# Patient Record
Sex: Female | Born: 1965
Health system: Southern US, Community
[De-identification: ages and names within clinical notes are randomized; demographics above are authoritative.]

## PROBLEM LIST (undated history)

## (undated) DIAGNOSIS — Z9289 Personal history of other medical treatment: Secondary | ICD-10-CM

## (undated) DIAGNOSIS — I10 Essential (primary) hypertension: Secondary | ICD-10-CM

## (undated) DIAGNOSIS — IMO0001 Reserved for inherently not codable concepts without codable children: Secondary | ICD-10-CM

## (undated) DIAGNOSIS — Z5189 Encounter for other specified aftercare: Secondary | ICD-10-CM

## (undated) DIAGNOSIS — J302 Other seasonal allergic rhinitis: Secondary | ICD-10-CM

## (undated) DIAGNOSIS — K219 Gastro-esophageal reflux disease without esophagitis: Secondary | ICD-10-CM

## (undated) DIAGNOSIS — M797 Fibromyalgia: Secondary | ICD-10-CM

## (undated) DIAGNOSIS — R161 Splenomegaly, not elsewhere classified: Principal | ICD-10-CM

## (undated) DIAGNOSIS — G709 Myoneural disorder, unspecified: Secondary | ICD-10-CM

## (undated) DIAGNOSIS — D649 Anemia, unspecified: Secondary | ICD-10-CM

## (undated) DIAGNOSIS — J329 Chronic sinusitis, unspecified: Secondary | ICD-10-CM

## (undated) DIAGNOSIS — E785 Hyperlipidemia, unspecified: Secondary | ICD-10-CM

## (undated) HISTORY — DX: Gastro-esophageal reflux disease without esophagitis: K21.9

## (undated) HISTORY — DX: Myoneural disorder, unspecified: G70.9

## (undated) HISTORY — DX: Reserved for inherently not codable concepts without codable children: IMO0001

## (undated) HISTORY — DX: Encounter for other specified aftercare: Z51.89

## (undated) HISTORY — PX: ABDOMINAL SURGERY: SHX537

## (undated) HISTORY — DX: Splenomegaly, not elsewhere classified: R16.1

## (undated) HISTORY — DX: Essential (primary) hypertension: I10

## (undated) HISTORY — PX: COLONOSCOPY: SHX174

## (undated) HISTORY — DX: Anemia, unspecified: D64.9

## (undated) HISTORY — DX: Hyperlipidemia, unspecified: E78.5

## (undated) HISTORY — PX: OTHER SURGICAL HISTORY: SHX169

---

## 1994-09-17 HISTORY — PX: TONSILLECTOMY: SUR1361

## 1995-01-16 HISTORY — PX: CHOLECYSTECTOMY: SHX55

## 1995-05-18 HISTORY — PX: APPENDECTOMY: SHX54

## 1998-11-12 ENCOUNTER — Ambulatory Visit (HOSPITAL_COMMUNITY): Admission: RE | Admit: 1998-11-12 | Discharge: 1998-11-12 | Payer: Self-pay | Admitting: Family Medicine

## 1998-11-12 ENCOUNTER — Encounter: Payer: Self-pay | Admitting: Family Medicine

## 1998-11-18 ENCOUNTER — Ambulatory Visit (HOSPITAL_BASED_OUTPATIENT_CLINIC_OR_DEPARTMENT_OTHER): Admission: RE | Admit: 1998-11-18 | Discharge: 1998-11-18 | Payer: Self-pay | Admitting: Urology

## 1998-11-18 ENCOUNTER — Encounter: Payer: Self-pay | Admitting: Urology

## 1998-11-27 ENCOUNTER — Encounter: Payer: Self-pay | Admitting: Urology

## 1998-11-27 ENCOUNTER — Emergency Department (HOSPITAL_COMMUNITY): Admission: EM | Admit: 1998-11-27 | Discharge: 1998-11-28 | Payer: Self-pay | Admitting: Emergency Medicine

## 1998-12-02 ENCOUNTER — Encounter: Payer: Self-pay | Admitting: Urology

## 1998-12-02 ENCOUNTER — Ambulatory Visit (HOSPITAL_COMMUNITY): Admission: RE | Admit: 1998-12-02 | Discharge: 1998-12-02 | Payer: Self-pay | Admitting: Urology

## 1999-01-28 ENCOUNTER — Ambulatory Visit (HOSPITAL_COMMUNITY): Admission: RE | Admit: 1999-01-28 | Discharge: 1999-01-28 | Payer: Self-pay | Admitting: Gastroenterology

## 1999-01-28 ENCOUNTER — Encounter: Payer: Self-pay | Admitting: Gastroenterology

## 1999-02-06 ENCOUNTER — Encounter: Admission: RE | Admit: 1999-02-06 | Discharge: 1999-05-07 | Payer: Self-pay | Admitting: *Deleted

## 1999-02-20 ENCOUNTER — Ambulatory Visit (HOSPITAL_COMMUNITY): Admission: RE | Admit: 1999-02-20 | Discharge: 1999-02-20 | Payer: Self-pay | Admitting: Gastroenterology

## 1999-02-20 ENCOUNTER — Encounter: Payer: Self-pay | Admitting: Gastroenterology

## 1999-04-17 ENCOUNTER — Observation Stay (HOSPITAL_COMMUNITY): Admission: RE | Admit: 1999-04-17 | Discharge: 1999-04-18 | Payer: Self-pay | Admitting: General Surgery

## 1999-04-17 ENCOUNTER — Encounter (INDEPENDENT_AMBULATORY_CARE_PROVIDER_SITE_OTHER): Payer: Self-pay | Admitting: Specialist

## 1999-05-19 ENCOUNTER — Encounter: Admission: RE | Admit: 1999-05-19 | Discharge: 1999-05-19 | Payer: Self-pay | Admitting: Internal Medicine

## 1999-06-09 ENCOUNTER — Encounter: Admission: RE | Admit: 1999-06-09 | Discharge: 1999-06-09 | Payer: Self-pay | Admitting: Internal Medicine

## 1999-06-30 ENCOUNTER — Encounter (HOSPITAL_COMMUNITY): Admission: RE | Admit: 1999-06-30 | Discharge: 1999-09-28 | Payer: Self-pay | Admitting: Family Medicine

## 2000-01-23 ENCOUNTER — Encounter: Payer: Self-pay | Admitting: Emergency Medicine

## 2000-01-23 ENCOUNTER — Inpatient Hospital Stay (HOSPITAL_COMMUNITY): Admission: EM | Admit: 2000-01-23 | Discharge: 2000-01-28 | Payer: Self-pay | Admitting: Emergency Medicine

## 2000-02-10 ENCOUNTER — Ambulatory Visit (HOSPITAL_COMMUNITY): Admission: RE | Admit: 2000-02-10 | Discharge: 2000-02-10 | Payer: Self-pay | Admitting: Gastroenterology

## 2000-03-29 ENCOUNTER — Other Ambulatory Visit: Admission: RE | Admit: 2000-03-29 | Discharge: 2000-03-29 | Payer: Self-pay | Admitting: Otolaryngology

## 2000-04-22 ENCOUNTER — Encounter: Admission: RE | Admit: 2000-04-22 | Discharge: 2000-04-22 | Payer: Self-pay | Admitting: *Deleted

## 2000-04-22 ENCOUNTER — Encounter: Payer: Self-pay | Admitting: *Deleted

## 2001-01-07 ENCOUNTER — Other Ambulatory Visit: Admission: RE | Admit: 2001-01-07 | Discharge: 2001-01-07 | Payer: Self-pay | Admitting: Obstetrics and Gynecology

## 2001-02-08 ENCOUNTER — Encounter: Payer: Self-pay | Admitting: Obstetrics and Gynecology

## 2001-02-08 ENCOUNTER — Ambulatory Visit (HOSPITAL_COMMUNITY): Admission: RE | Admit: 2001-02-08 | Discharge: 2001-02-08 | Payer: Self-pay | Admitting: Obstetrics and Gynecology

## 2001-03-02 ENCOUNTER — Encounter: Payer: Self-pay | Admitting: Family Medicine

## 2001-03-02 ENCOUNTER — Encounter: Admission: RE | Admit: 2001-03-02 | Discharge: 2001-03-02 | Payer: Self-pay | Admitting: Family Medicine

## 2001-03-23 ENCOUNTER — Encounter (INDEPENDENT_AMBULATORY_CARE_PROVIDER_SITE_OTHER): Payer: Self-pay

## 2001-03-23 ENCOUNTER — Ambulatory Visit (HOSPITAL_COMMUNITY): Admission: RE | Admit: 2001-03-23 | Discharge: 2001-03-23 | Payer: Self-pay | Admitting: Obstetrics and Gynecology

## 2002-07-28 ENCOUNTER — Other Ambulatory Visit: Admission: RE | Admit: 2002-07-28 | Discharge: 2002-07-28 | Payer: Self-pay | Admitting: Obstetrics and Gynecology

## 2002-08-21 ENCOUNTER — Ambulatory Visit (HOSPITAL_COMMUNITY): Admission: RE | Admit: 2002-08-21 | Discharge: 2002-08-21 | Payer: Self-pay | Admitting: Internal Medicine

## 2003-03-08 ENCOUNTER — Encounter: Payer: Self-pay | Admitting: Internal Medicine

## 2003-03-08 ENCOUNTER — Encounter: Admission: RE | Admit: 2003-03-08 | Discharge: 2003-03-08 | Payer: Self-pay | Admitting: Internal Medicine

## 2003-05-25 ENCOUNTER — Encounter: Payer: Self-pay | Admitting: Family Medicine

## 2003-05-25 ENCOUNTER — Emergency Department (HOSPITAL_COMMUNITY): Admission: AD | Admit: 2003-05-25 | Discharge: 2003-05-25 | Payer: Self-pay | Admitting: Family Medicine

## 2003-11-21 ENCOUNTER — Encounter: Admission: RE | Admit: 2003-11-21 | Discharge: 2004-02-19 | Payer: Self-pay | Admitting: Internal Medicine

## 2004-10-13 ENCOUNTER — Emergency Department (HOSPITAL_COMMUNITY): Admission: EM | Admit: 2004-10-13 | Discharge: 2004-10-13 | Payer: Self-pay | Admitting: Family Medicine

## 2005-04-16 ENCOUNTER — Ambulatory Visit (HOSPITAL_COMMUNITY): Admission: RE | Admit: 2005-04-16 | Discharge: 2005-04-16 | Payer: Self-pay | Admitting: Obstetrics and Gynecology

## 2005-07-01 ENCOUNTER — Encounter: Admission: RE | Admit: 2005-07-01 | Discharge: 2005-08-16 | Payer: Self-pay | Admitting: Internal Medicine

## 2005-07-11 ENCOUNTER — Emergency Department (HOSPITAL_COMMUNITY): Admission: EM | Admit: 2005-07-11 | Discharge: 2005-07-11 | Payer: Self-pay | Admitting: Family Medicine

## 2005-07-18 ENCOUNTER — Inpatient Hospital Stay (HOSPITAL_COMMUNITY): Admission: AD | Admit: 2005-07-18 | Discharge: 2005-07-18 | Payer: Self-pay | Admitting: Obstetrics and Gynecology

## 2005-08-12 ENCOUNTER — Ambulatory Visit (HOSPITAL_COMMUNITY): Admission: RE | Admit: 2005-08-12 | Discharge: 2005-08-12 | Payer: Self-pay | Admitting: Obstetrics and Gynecology

## 2005-08-16 ENCOUNTER — Inpatient Hospital Stay (HOSPITAL_COMMUNITY): Admission: AD | Admit: 2005-08-16 | Discharge: 2005-08-16 | Payer: Self-pay | Admitting: Obstetrics and Gynecology

## 2005-09-09 ENCOUNTER — Encounter: Admission: RE | Admit: 2005-09-09 | Discharge: 2005-12-08 | Payer: Self-pay | Admitting: Internal Medicine

## 2006-10-13 ENCOUNTER — Ambulatory Visit (HOSPITAL_COMMUNITY): Admission: RE | Admit: 2006-10-13 | Discharge: 2006-10-13 | Payer: Self-pay | Admitting: Gastroenterology

## 2006-10-18 ENCOUNTER — Ambulatory Visit: Payer: Self-pay | Admitting: Vascular Surgery

## 2006-10-18 ENCOUNTER — Ambulatory Visit (HOSPITAL_COMMUNITY): Admission: RE | Admit: 2006-10-18 | Discharge: 2006-10-18 | Payer: Self-pay | Admitting: Family Medicine

## 2006-12-27 ENCOUNTER — Ambulatory Visit (HOSPITAL_COMMUNITY): Admission: RE | Admit: 2006-12-27 | Discharge: 2006-12-27 | Payer: Self-pay | Admitting: Family Medicine

## 2007-06-08 ENCOUNTER — Emergency Department (HOSPITAL_COMMUNITY): Admission: EM | Admit: 2007-06-08 | Discharge: 2007-06-08 | Payer: Self-pay | Admitting: *Deleted

## 2007-07-20 ENCOUNTER — Emergency Department (HOSPITAL_COMMUNITY): Admission: EM | Admit: 2007-07-20 | Discharge: 2007-07-20 | Payer: Self-pay | Admitting: Emergency Medicine

## 2007-10-11 ENCOUNTER — Ambulatory Visit (HOSPITAL_COMMUNITY): Admission: RE | Admit: 2007-10-11 | Discharge: 2007-10-11 | Payer: Self-pay | Admitting: Obstetrics and Gynecology

## 2008-03-09 ENCOUNTER — Ambulatory Visit (HOSPITAL_COMMUNITY): Admission: RE | Admit: 2008-03-09 | Discharge: 2008-03-09 | Payer: Self-pay | Admitting: Obstetrics and Gynecology

## 2008-03-29 ENCOUNTER — Encounter: Admission: RE | Admit: 2008-03-29 | Discharge: 2008-05-08 | Payer: Self-pay | Admitting: Obstetrics and Gynecology

## 2008-06-23 ENCOUNTER — Inpatient Hospital Stay (HOSPITAL_COMMUNITY): Admission: AD | Admit: 2008-06-23 | Discharge: 2008-06-23 | Payer: Self-pay | Admitting: Obstetrics and Gynecology

## 2008-07-06 ENCOUNTER — Inpatient Hospital Stay (HOSPITAL_COMMUNITY): Admission: AD | Admit: 2008-07-06 | Discharge: 2008-07-06 | Payer: Self-pay | Admitting: Obstetrics and Gynecology

## 2008-07-17 ENCOUNTER — Inpatient Hospital Stay (HOSPITAL_COMMUNITY): Admission: AD | Admit: 2008-07-17 | Discharge: 2008-07-17 | Payer: Self-pay | Admitting: Obstetrics and Gynecology

## 2008-07-17 DIAGNOSIS — Z9289 Personal history of other medical treatment: Secondary | ICD-10-CM

## 2008-07-17 HISTORY — DX: Personal history of other medical treatment: Z92.89

## 2008-07-31 ENCOUNTER — Encounter: Admission: RE | Admit: 2008-07-31 | Discharge: 2008-08-29 | Payer: Self-pay | Admitting: Obstetrics and Gynecology

## 2008-08-06 ENCOUNTER — Ambulatory Visit: Admission: RE | Admit: 2008-08-06 | Discharge: 2008-08-06 | Payer: Self-pay | Admitting: Obstetrics and Gynecology

## 2009-01-05 ENCOUNTER — Emergency Department (HOSPITAL_COMMUNITY): Admission: EM | Admit: 2009-01-05 | Discharge: 2009-01-05 | Payer: Self-pay | Admitting: Family Medicine

## 2009-01-22 ENCOUNTER — Emergency Department (HOSPITAL_COMMUNITY): Admission: EM | Admit: 2009-01-22 | Discharge: 2009-01-22 | Payer: Self-pay | Admitting: Family Medicine

## 2009-03-28 ENCOUNTER — Encounter: Payer: Self-pay | Admitting: Family Medicine

## 2009-03-28 ENCOUNTER — Ambulatory Visit (HOSPITAL_COMMUNITY): Admission: RE | Admit: 2009-03-28 | Discharge: 2009-03-28 | Payer: Self-pay | Admitting: Family Medicine

## 2009-03-28 ENCOUNTER — Ambulatory Visit: Payer: Self-pay | Admitting: Vascular Surgery

## 2009-08-08 ENCOUNTER — Ambulatory Visit (HOSPITAL_COMMUNITY): Admission: RE | Admit: 2009-08-08 | Discharge: 2009-08-08 | Payer: Self-pay | Admitting: Internal Medicine

## 2009-08-22 ENCOUNTER — Emergency Department (HOSPITAL_COMMUNITY): Admission: EM | Admit: 2009-08-22 | Discharge: 2009-08-22 | Payer: Self-pay | Admitting: Emergency Medicine

## 2010-02-18 ENCOUNTER — Ambulatory Visit (HOSPITAL_COMMUNITY): Admission: RE | Admit: 2010-02-18 | Discharge: 2010-02-18 | Payer: Self-pay | Admitting: Obstetrics and Gynecology

## 2010-02-21 ENCOUNTER — Ambulatory Visit (HOSPITAL_COMMUNITY): Admission: RE | Admit: 2010-02-21 | Discharge: 2010-02-21 | Payer: Self-pay | Admitting: Obstetrics and Gynecology

## 2010-04-19 ENCOUNTER — Emergency Department (HOSPITAL_COMMUNITY): Admission: EM | Admit: 2010-04-19 | Discharge: 2010-04-19 | Payer: Self-pay | Admitting: Family Medicine

## 2010-09-03 ENCOUNTER — Ambulatory Visit (HOSPITAL_COMMUNITY)
Admission: RE | Admit: 2010-09-03 | Discharge: 2010-09-03 | Payer: Self-pay | Source: Home / Self Care | Attending: Endocrinology | Admitting: Endocrinology

## 2010-09-07 ENCOUNTER — Encounter: Payer: Self-pay | Admitting: Internal Medicine

## 2010-11-25 LAB — POCT URINALYSIS DIP (DEVICE)
Bilirubin Urine: NEGATIVE
Nitrite: NEGATIVE
Protein, ur: NEGATIVE mg/dL
pH: 6.5 (ref 5.0–8.0)

## 2010-11-25 LAB — POCT PREGNANCY, URINE: Preg Test, Ur: NEGATIVE

## 2011-01-02 NOTE — Op Note (Signed)
[facility]  Patient:    Alexis Campos, Alexis Campos                   MRN: [id number] Proc. Date: 03/23/01 Adm. Date:  [id number] Attending:  Maxie Better                           Operative Report  PREOPERATIVE DIAGNOSES:       1. Pelvic pain.                               2. History of pelvic endometriosis.  POSTOPERATIVE DIAGNOSES:      1. Pelvic pain.                               2. History of pelvic endometriosis.                               3. Pelvic/bowel adhesions.  PROCEDURES:                   1. Diagnostic laparoscopy.                               2. Lysis of adhesions.  ANESTHESIA:                   General.  SURGEON:                      Sheronette A. Cousins, M.D.  DESCRIPTION OF PROCEDURE:     Under adequate general anesthesia, the patient was placed in the dorsal lithotomy position.  She was sterilely prepped and draped in the usual fashion.  The bladder was catheterized for a large amount of urine.  A bivalve speculum was placed in the vagina.  A single-tooth tenaculum was placed on the anterior lip of the cervix.  An acorn cannula was introduced into the cervical os and attached to the tenaculum for manipulation of the uterus.  Attention was then turned to the abdomen, where an infraumbilical incision was made through the previous scar.  A Veress needle was introduced and tested for placement using normal saline.  Opening pressure of 3 was noted.  Then, 2.5 L of CO2 was insufflated.  The Veress needle was removed.  A 10 mm disposable trocar was introduced into the abdomen without incident.  The patient was placed in Trendelenburg.  A small, suprapubic incision was then made and, under direct visualization, a 5 mm port was placed.  Inspection of the pelvis was notable for a normal uterus.  The anterior cul-de-sac was notable for two staples imbedded in the peritoneum on the right side.   The posterior cul-de-sac on the left  was an area of old, burnt-out scarring, probably from old prior endometriosis.  There was some bowel adhesion on the left pelvic side wall.  The left tube and ovary were normal with no evidence of endometriosis seen.  On the right, there were right colon adhesions to the right pelvic side wall and to the right adnexa proximally.  Using the [facility] scissors, the bowel adhesions were carefully lysed, as were the right adnexal adhesions.  The liver edge appeared to be normal.  The  abdomen was then subsequently irrigated.  The peritoneal tissue on the right pelvic side wall was removed and sent for evaluation for any evidence of endometriosis.  A small amount of fluid was left in the pelvis. The procedure was terminated after good hemostasis was noted.  The ______ port was removed under direct visualization.  The abdomen was deflated and the infraumbilical port removed under direct visualization.  The skin incisions were approximated with 4-0 Vicryl after being injected with 0.25% Marcaine. The instruments from the vagina were removed.  SPECIMENS:                    Right pelvic side wall peritoneal biopsy.  ESTIMATED BLOOD LOSS:         Minimal.  COMPLICATIONS:                None.  DISPOSITION:                  The patient tolerated the procedure well and was transferred to the recovery room in stable condition. DD:  03/23/01 TD:  03/24/01 Job: [id number]

## 2011-01-02 NOTE — Op Note (Signed)
NAMEJAMIRACLE, Alexis Campos            ACCOUNT NO.:  [id number]   MEDICAL RECORD NO.:  [id number]          PATIENT TYPE:  AMB   LOCATION:  ENDO                         FACILITY:  [facility]   PHYSICIAN:  Graylin Shiver, M.D.   DATE OF BIRTH:  1966-06-04   DATE OF PROCEDURE:  10/13/2006  DATE OF DISCHARGE:                               OPERATIVE REPORT   PROCEDURE:  Colonoscopy.   INDICATIONS FOR PROCEDURE:  Rectal bleeding, family history of colon  cancer and colon polyps.   Informed consent was obtained after explanation of the risks of  bleeding, infection and perforation.   MEDICATIONS:  See EGD report.   PROCEDURE:  With the patient in the left lateral decubitus position a  rectal exam was performed.  No masses were felt.  There were some  external hemorrhoids.  The colonoscope was inserted into the rectum and  advanced around the colon to the cecum.  Cecal landmarks were  identified.  The cecum, ascending colon looked normal.  The transverse  colon looked normal.  The descending colon, sigmoid and rectum looked  normal.  She tolerated the procedure well without complications.   IMPRESSION:  Normal colonoscopy with some external hemorrhoids.   I would recommend a repeat colonoscopy again in view of her family  history in 5 years.           ______________________________  Graylin Shiver, M.D.     SFG/MEDQ  D:  10/13/2006  T:  10/13/2006  Job:  [id number]   cc:   Dorisann Frames, M.D.

## 2011-01-02 NOTE — H&P (Signed)
[place]. [facility]  Patient:    Alexis Campos, Alexis Campos                     MRN: [id number] Adm. Date:  [id number] Attending:  Ophelia Shoulder Dictator:   Moshe Salisbury, R.N., A.C.N.P.                         History and Physical  DATE OF BIRTH:  April 23, 1966  CHIEF COMPLAINT:  Thoracic back pain, severe, with pain in her right upper chest radiating to the right arm all the way to the right hand and the right side of the neck to the right jaw.  HISTORY OF PRESENT ILLNESS:  The patient had back pain with shortness of breath, nausea, and diaphoresis in March while she was in [place].  She was checked by an M.D. there, who told her that her blood pressure was high.  This pain lasted for about 45 minutes.  Three weeks ago, she had severe pain in her mid-back with shortness of breath, diaphoresis, and nausea.  It lasted one to two hours this time and was severe, about an 8 to 9 on a scale of 1/10 for pain.  She has not felt well for more than a week.  She has been tired with shortness of breath with only a little exertion and also breathless when she talked.  She has had pain with deep breaths under her right shoulder blade until about two days ago.  Last night, she had some discomfort in the middle of her upper back with a cramping feeling in her chest.  She felt sick and sweated and this was off and on until about 2 oclock a.m., when she had severe upper back pain, which radiated to her right arm and neck with pain in her right upper chest.  She also had diaphoresis, shortness of breath, and nausea.  This lasted about two hours.  She took a hot shower and some hydrocodone and then the pain eased off.  It started again at about noon and became bad but not severe, about a 6 or 7 on a scale of 0/10.  She also had nausea and diarrhea and states that the radiated pain to her right arm, right neck, and chest was worse than before.  She still has pain in her  back at about a 2 to 3 on a scale of 0/10 and she states that the pain in her back feels like a vise is squeezing her between her shoulder blades.  She states that the chest pain and neck pain and jaw pain this evening come and go.  She also said that she had only received four 81 mg aspirins but no nitroglycerin to relieve the pain.  PAST MEDICAL HISTORY:  Insulin-dependent diabetes mellitus.  She has had diabetes since 1996.  She also has hypertension, hypercholesterolemia, endometriosis, a history of leukoplakia, and she also has a history of kidney stones.  She states that she has a fatty liver.  PAST SURGICAL HISTORY:  Tonsillectomy, two biopsies on her tongue, a cholecystectomy, an appendectomy, laparoscopy to remove adhesions x 3, and liver biopsy in August.  She had cystoscopy with stents twice.  ALLERGIES:  The patient states that CODEINE causes her nausea and some unknown PAIN MEDICATION caused her hands to swell.  CURRENT MEDICATIONS:  1. Glucophage XR 500 mg 4 every evening with food.  2. Zocor 20 mg 1 every  night at bedtime.  3. Aygestin 5 mg 1 every morning.  4. Avandia 4 mg 1 every morning and 1 every evening with meals.  5. Multivitamin once a day.  6. Enteric-coated aspirin 81 mg 1 every morning.  7. Altace 10 mg 1 every morning.  8. Hydrocodone 50/500 mg 1 every six hours if needed for pain.  9. She does a capillary blood glucose once twice a day. 10. She takes insulin 70/30 31 units subcu in the morning and 15 units subcu     in the evening.  FAMILY HISTORY:  The patients family history is significant for her mother having had coronary artery bypass grafting x 4.  Her mother is 39 years old and has diabetes mellitus, hypertension, anemia, and coronary artery disease. On her mothers side, there is a strong history of coronary artery disease. Her father is estranged from the family but she notes that he is about 54 and has had congestive heart failure and colon  cancer.  She has three brothers, one of whom has irritable bowel syndrome.  Otherwise, they are healthy.  She knows that one of her maternal cousins has both diabetes mellitus and coronary artery bypass grafting.  SOCIAL HISTORY:  The patient is single.  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  She does not use alcohol.  She does not smoke. She rarely uses caffeine.  She does not abuse drugs.  She is left handed.  REVIEW OF SYSTEMS:  Constitutional:  The patient denies fevers.  She has had chills last night and diaphoresis with her chest pain every time she has had chest pain.  She states that she has gained over 20 pounds in the last few months.  She has some swelling in her hands.  She has been sleeping restlessly lately.  Eyes:  The patient denies diplopia, contacts, glaucoma, or cataracts. She does have some blurring due to her diabetes and she wears glasses to drive.  Ears, nose, mouth, and throat:  The patient denies deafness, tinnitus, sneezing, dysacusia, sores in her mouth, or dentures.  She does have some rhinorrhea.  Cardiovascular:  Chest pain as noted.  The patient says that her heart races and pounds sometimes.  She does have dyspnea on exertion and with talking.  She has been sleeping on two pillows lately and states that her bilateral calves hurt when she walks.  Respiratory:  The patient denies coughing, wheezing, or the use of tobacco.  She does produce some sputum when she clears her throat.  Gastrointestinal:  The patient denies dysphagia, indigestion, but she has had nausea recently with her chest pain and she had diarrhea today.  She frequently has constipation.  Genitourinary:  The patient denies dysuria, pyuria, hematuria, anuria, hesitation, and frequency.  She has nocturia x 2-3.  Her last menstrual period was in May 2000.  She takes medication to suppress her  menstrual periods.  Musculoskeletal:  The patient has had aching joints this week.  She has no severe myalgias.  She has been fatigued lately.  Her gait is steady.  Skin:  The patient denies any rashes or  skin problems, though she states her face has been getting red lately. Breasts:  The patient denies masses, lumps, or tenderness of the breasts, though she states that in her right breast she has noticed a thickening of the right nipple and plans to have this checked by her  GYN doctor.  Neurologic: The patient states that her hands have been trembling lately and she has had some faintness but without syncope.  She also has had dizziness with change in her posture.  Her hands and feet get numb with her faintness.  She denies any seizures of the signs and symptoms of a stroke.  Psychiatric:  The patient states that she has been under stress lately and has felt some depression. She denies anorexia or hallucinations.  Endocrine:  The patient denies any thyroid disease but she does have insulin-dependent diabetes mellitus.  She states that she has had some excessive thirst and hunger lately.  Hematologic: The patient states that she both bruises and bleeds easily.  Lymphatics:  The patient denies adenopathy of the neck, axillae, and groin.  Allergic:  The patient states that CODEINE causes nausea and that she has seasonal allergies.  PHYSICAL EXAMINATION:  VITAL SIGNS:  Blood pressure 147/61, pulse 102, respirations 18, temperature 97.5, SAO2 100% on 2 L of oxygen by nasal cannula.  GENERAL:  The patient is a 45 year old obese white female who is well-developed and well-nourished and in no acute distress.  HEENT:  The patients pupils are equal, round and reactive to light and 2 mm in diameter.  She is normocephalic, atraumatic.  Her mouth is moist.  Her oropharynx is benign.  NECK:  Supple.  She has a midline trachea without jugular venous distention, bruit, or thyromegaly.  CHEST:   Clear to auscultation and percussion.  She is eupneic.  She is on 2 L of oxygen by nasal cannula.  BREASTS:  Normal contour.  There are nontender and without discharge.  ADENOPATHY:  The patient is without cervical adenopathy.  CARDIAC:  The patient has a regular rate and rhythm.  S1 and S2 are clearly heard.  No murmurs, gallops, rubs, or clicks are auscultated.  Her apical impulse is not displaced.  ABDOMEN:  The patient has bowel sounds in all quadrants but she is also tender throughout her abdomen.  She states this is normal for her due to endometriosis.  Her abdomen is obese and soft and she has increased percussion over the quadrants of her lower abdomen.  GENITOURINARY:  The patient is nontender over her bladder.  RECTUM:  The rectum is not checked at this time.  It is not pertinent to the exam.  EXTREMITIES:  The patient moves all extremities x 4.  Her strength is 5/5 in her upper and lower extremities.  She is without ankle edema.  SKIN:  The patients skin is warm and dry and without jaundice, cyanosis, pallor, or rashes.  She has brisk capillary refill.  NEUROLOGIC:  The patient is conscious, alert, and oriented to person, place, time, and situation.  Cranial nerves II-XII are grossly intact.  She has fine tremors in her hands.  LABORATORY DATA:  Chest x-ray:  The patients chest x-ray shows no acute disease.  EKG:  The patients EKG shows normal sinus rhythm with a small Q wave in lead III.  No significant changes from April 15, 1999.  The patients sodium is 139, potassium 4.9, chloride 109, bicarb 27, BUN 14, creatinine 0.9, glucose 113, alkaline phosphatase low at 32.  White blood cell count 6.0, hemoglobin 13.6, hematocrit 41, platelets 317.  Her leukocytes are low at 27.  Her monocytes are low at 4.  Her APPT is 24, her PT is 13.6, her INR is 1.1.  Her CK is 64 and her MB is less than 0.3.  The relative index is not calculated.  Her troponin-I is less than  0.03.  IMPRESSION: 1. Chest pain, rule out myocardial infarction. 2. Hypertension. 3. Hypercholesterolemia. 4. Obesity. 5. Shortness of breath. 6. Nausea and diaphoresis. 7. Insulin-dependent diabetes mellitus.  PLAN: 1. Admit to telemetry. 2. Nitroglycerin drip. 3. Serial enzymes. 4. EKG in the morning. 5. Medications to treat diabetes, hypertension, and hypercholesterolemia. DD:  01/23/00 TD:  01/24/00 Job: [id number]

## 2011-01-02 NOTE — Cardiovascular Report (Signed)
[place]. [facility]  Patient:    Alexis Campos, Alexis Campos                     MRN: [id number] Proc. Date: 01/26/00 Adm. Date:  [id number] Attending:  Ophelia Shoulder CC:         [facility]             [facility] Cardiac Cath Lab             Madaline Savage, M.D.             Fritzi Mandes, M.D.                        Cardiac Catheterization  PROCEDURES PERFORMED: 1. Selective coronary angiography by Judkins technique. 2. Retrograde left heart catheterization. 3. Left ventriculography.  COMPLICATIONS:  None.  ENTRY SITE:  Right femoral.  DYE USED:  Omnipaque.  PATIENT PROFILE:  The patient is a 45 year old diabetic woman who has had right-sided chest pain for several months which comes on with exertion and is relieved by rest.  She has three other family members who have had this same presentation before, later determining that they had coronary artery disease. The patient was admitted on January 23, 2000 to [facility] and subsequent cardiac enzymes and EKGs have been negative for myocardial infarction.  Today, she enters the cardiac catheterization lab at [facility] on an inpatient basis in transfer from [facility]; this was an elective procedure.  RESULTS:  Pressures:  The left ventricular pressure was 120/18.  Central aortic pressure 120/75; mean of 95.  Angiographic results:  The left main coronary artery was normal.  The left anterior descending coronary artery ends on the lower anterior wall. It gives rise to one bifurcating diagonal branch.  The LAD and the diagonal are basically unremarkable in appearance.  It should be noted that the distal vessel in a fairly abrupt pattern tapers quickly, but no lesions are seen.  The left circumflex coronary artery gives rise to one very large obtuse marginal branch which is normal in appearance.  The right coronary artery is dominant and angiographically patent.  The left  ventricle shows normal contractility with no mitral regurgitation or LV thrombus.  FINAL DIAGNOSES: 1. Angiographically patent coronary arteries. 2. Normal left ventricular systolic function. DD:  01/26/00 TD:  01/28/00 Job: [id number]

## 2011-01-02 NOTE — Procedures (Signed)
[place]. [facility]  Patient:    Alexis Campos, Alexis Campos                     MRN: [id number] Proc. Date: 02/16/00 Adm. Date:  [id number] Disc. Date: [id number] Attending:  Judeth Cornfield CC:         Fritzi Mandes, M.D.             Madaline Savage, M.D.             Achilles Dunk, M.D., [facility]                           Procedure Report  PROCEDURE:  Esophageal manometry.  HISTORY:  [name] has been suffering from episodic episodes of severe chest pain consisting of pain emanating between the shoulder blades and radiating to her anterior chest.  Recent cardiac catheterization was negative, as was an upper endoscopy.  She has undergone abdominal ultrasound, testing of LFTs and amylase, all of which were negative.  MRCP and ERCP in the past have been negative.  Test was performed with provocative testing with Tessalon, to rule out esophageal spasm.  DESCRIPTION OF PROCEDURE:  The manometry was performed in the usual pull-through fashion.  Tensilon was then administered per protocol.  FINDINGS: 1. Upper esophageal sphincter pressure and contractions were normal. 2. There were normal peristaltic contractions throughout the body of the    esophagus.  In a single channel there was slight elevation of the amplitude    of the contractions measuring 189 and 246 mmHg, respectively.  After    injection of Tensilon, contractions increased from 109 to 196 mmHg in the    proximal lead, 189 to 236 mmHg in the middle lead and 246 to 268 mmHg in    the distal channel.  Contractions remained peristaltic after Tensilon    administration. 3. Resting pressure was slightly elevated at 51.9 mmHg.  Relaxation was 93%.  The patient did not experience chest pain after administration of Tensilon.  IMPRESSION:  Probable normal esophageal manometry except for a slight elevation of the amplitude of esophageal contractions. DD:  02/16/00 TD:  02/17/00 Job:  [id number]

## 2011-01-02 NOTE — Procedures (Signed)
[facility]  Patient:    TAKEYSHA, BONK                     MRN: [id number] Proc. Date: 01/28/00 Adm. Date:  [id number] Disc. Date: [id number] Attending:  Ophelia Shoulder CC:         Madaline Savage, M.D.             Coralee North, M.D.                           Procedure Report  PROCEDURE:  Upper endoscopy.  HISTORY OF PRESENT ILLNESS:  Ms. Stefan Church is a 45 year old diabetic female admitted with severe mid back pain. Cardiac cath was negative. She has a history of recurrent right upper quadrant pain with a negative extensive workup. Test was performed to rule out esophageal abnormalities.  INFORMED CONSENT:  The patient provided consent after risks, benefits, and alternatives were explained.  MEDICATIONS:  Versed 12, fentanyl 100 mcg IV and cetacaine spray.  DESCRIPTION OF PROCEDURE:  The patient was placed in the left lateral decubitus position and administered continuous low flow oxygen and was placed on pulse oximetry. The Olympus video gastroscope was inserted under direct vision into the oropharynx and esophagus.  FINDINGS:  Normal esophagus, stomach and duodenum.  IMPRESSION: 1. Normal upper endoscopy. 2. Abdominal pain of unclear etiology.  RECOMMENDATION:  Proceed with esophageal manometry with provocative testing (Tensilon test). DD:  01/28/00 TD:  01/30/00 Job: [id number]

## 2011-01-02 NOTE — Discharge Summary (Signed)
[place]. [facility]  Patient:    Alexis Campos, Alexis Campos                     MRN: [id number] Adm. Date:  [id number] Disc. Date: 01/28/00 Attending:  Judeth Cornfield Dictator:   Halford Decamp Delanna Ahmadi, R.N., N.P. CC:         Fritzi Mandes, M.D.             Alexis Hair. Arlyce Campos, M.D. [facility]             Adolph Pollack, M.D.                           Discharge Summary  Alexis Campos was admitted with complaints of chest pain, shortness of breath, nausea and diaphoresis occurring first in March in [place].  The night before admission she had discomfort in the middle of her upper back with cramping feeling in her chest.  She had nausea and diaphoresis on and off. She then had severe back pain that radiated to her right arm and back and pain in her right chest lasting for two hours.  She came to the emergency room. She was admitted to rule out an MI.  She was put on Lovenox, IV nitroglycerin, aspirin and beta-blocker.  She ruled out for an MI.  She underwent cardiac catheterization on January 26, 2000, which showed normal coronaries with normal LV function.  A GI consult was called.  She was seen by Alexis Hair. Arlyce Campos, M.D. An EGD was normal.  She was considered stable on January 28, 2000, to be discharged to home with further outpatient work-up for GI etiology of her chest pain.  LABS:  On January 23, 2000, hemoglobin was 13.6, hematocrit 41.0, wbc 6, platelets 317, sodium 139, potassium 4.8, BUN 14, creatinine 0.9, amylase 64, lipase 40. AST 22, ALT 17, ALP was 32.  CK-MB #1 was 64/0.3 with troponin less than 0.03, #2 was 51/0.3 with troponin less than 0.03, #3 was 51/.3.  Total cholesterol was 164, triglycerides was 106, HDL was 24, LDL was 119.  Total cholesterol HDL ratio was 6.8.  Lipase was 40.  EKG showed sinus tachycardia, otherwise normal.  No arrhythmias while on telemetry.  Chest x-ray showed nonactive disease.  EGD was normal.  DISCHARGE MEDICATIONS:  1. Toprol  XL 50 mg once a day.  2. Protonix 40 mg every day.  3. Percocet one tablet every six hours as needed for pain.  4. Glucophage XR 500 mg four every p.m. with food.  5. Zocor 20 mg _____________ sleep h.s. then 5 mg one every a.m.  6. Avandia 4 mg every a.m. and one every p.m.  7. Multivitamin everyday.  8. Aspirin 81 mg every a.m.  9. Altace 10 mg every a.m. 10. Insulin 70/30 subcutaneous 31 units in the morning and 15 units in the     p.m.  FOLLOW-UP:  She will follow up with Alexis Campos. Alexis Campos, F.N.P.C., at 10:10 on March 02, 2000.  She will have an appointment with Alexis Hair. Arlyce Campos, M.D., on February 16, 2000, at 3:45.  She is scheduled to have an esophageal manometry February 10, 2000, at 10:30.  DIET:  She is to continue her diabetic low fat diet.  ACTIVITY:  She should do no strenuous activity or driving for two days.  WOUND:  If she has any problems with her groin, she will give Alexis Campos, M.D.s,  office a call.  DISCHARGE DIAGNOSES: 1. Chest pain, etiology unknown.  Cardiac ischemia ruled out with cardiac    catheterization showing normal coronaries and normal left ventricular    function.  CK-MBs negative and normal EKG. 2. Adult onset diabetes mellitus type 2 on insulin. 3. Hypertension. 4. History of hypercholesterolemia. 5. History of endometriosis. 6. History of nephrolithiasis. 7. Multiple surgeries including tonsillectomy, two biopsies of the tongue,    cholecystectomy, appendectomy, laparoscopic retrieval of adhesions x 3,    liver biopsy and cystoscopy with stents x 2. DD:  02/11/00 TD:  02/11/00 Job: [id number]

## 2011-01-02 NOTE — Op Note (Signed)
NAMEMARGARINE, Alexis Campos            ACCOUNT NO.:  [id number]   MEDICAL RECORD NO.:  [id number]          PATIENT TYPE:  AMB   LOCATION:  ENDO                         FACILITY:  [facility]   PHYSICIAN:  Graylin Shiver, M.D.   DATE OF BIRTH:  1966-01-22   DATE OF PROCEDURE:  10/13/2006  DATE OF DISCHARGE:                               OPERATIVE REPORT   INDICATIONS:  Heartburn.   Informed consent was obtained after explanation of the risks of  bleeding, infection and perforation.   PREMEDICATION:  The patient was given a total of fentanyl 200 mcg IV and  Versed 17.5 mg IV for both EGD and colonoscopy which followed the EGD.   PROCEDURE:  With the patient in the left lateral decubitus position.  The Pentax gastroscope was inserted into the oropharynx and passed into  the esophagus.  It was advanced down the esophagus, then into the  stomach and into the duodenum.  The second portion and bulb of the  duodenum were normal.  The stomach showed some erythema in the antrum.  No ulcers or erosions.  The body of the stomach looked normal.  The  fundus and cardia looked normal.  The esophagus looked normal.  The  esophagogastric junction looked normal at 40 cm.  A biopsy for CLO-test  was obtained from the distal stomach.  She tolerated the procedure well  without complications.   IMPRESSION:  Minimal gastritis.   PLAN:  The CLO test will be checked.           ______________________________  Graylin Shiver, M.D.     SFG/MEDQ  D:  10/13/2006  T:  10/13/2006  Job:  [id number]   cc:   Dorisann Frames, M.D.

## 2011-01-25 ENCOUNTER — Other Ambulatory Visit: Payer: Self-pay | Admitting: Rheumatology

## 2011-01-25 ENCOUNTER — Ambulatory Visit (HOSPITAL_COMMUNITY)
Admission: RE | Admit: 2011-01-25 | Discharge: 2011-01-25 | Disposition: A | Discharge status: Home / Self Care | Payer: 59 | Source: Ambulatory Visit | Attending: Rheumatology | Admitting: Rheumatology

## 2011-01-25 DIAGNOSIS — M25561 Pain in right knee: Secondary | ICD-10-CM

## 2011-01-25 DIAGNOSIS — M19039 Primary osteoarthritis, unspecified wrist: Secondary | ICD-10-CM | POA: Insufficient documentation

## 2011-01-25 DIAGNOSIS — M25569 Pain in unspecified knee: Secondary | ICD-10-CM | POA: Insufficient documentation

## 2011-04-22 ENCOUNTER — Other Ambulatory Visit (HOSPITAL_COMMUNITY): Payer: Self-pay | Admitting: Gastroenterology

## 2011-04-22 DIAGNOSIS — R109 Unspecified abdominal pain: Secondary | ICD-10-CM

## 2011-04-27 ENCOUNTER — Other Ambulatory Visit (HOSPITAL_COMMUNITY): Payer: 59

## 2011-04-27 ENCOUNTER — Ambulatory Visit (HOSPITAL_COMMUNITY)
Admission: RE | Admit: 2011-04-27 | Discharge: 2011-04-27 | Disposition: A | Discharge status: Home / Self Care | Payer: 59 | Source: Ambulatory Visit | Attending: Gastroenterology | Admitting: Gastroenterology

## 2011-04-27 DIAGNOSIS — R109 Unspecified abdominal pain: Secondary | ICD-10-CM | POA: Insufficient documentation

## 2011-04-27 DIAGNOSIS — N200 Calculus of kidney: Secondary | ICD-10-CM | POA: Insufficient documentation

## 2011-04-27 DIAGNOSIS — K7689 Other specified diseases of liver: Secondary | ICD-10-CM | POA: Insufficient documentation

## 2011-04-27 DIAGNOSIS — R161 Splenomegaly, not elsewhere classified: Secondary | ICD-10-CM | POA: Insufficient documentation

## 2011-05-04 ENCOUNTER — Other Ambulatory Visit: Payer: Self-pay | Admitting: Gastroenterology

## 2011-05-04 ENCOUNTER — Ambulatory Visit (HOSPITAL_COMMUNITY)
Admission: RE | Admit: 2011-05-04 | Discharge: 2011-05-04 | Disposition: A | Discharge status: Home / Self Care | Payer: 59 | Source: Ambulatory Visit | Attending: Gastroenterology | Admitting: Gastroenterology

## 2011-05-04 DIAGNOSIS — R1013 Epigastric pain: Secondary | ICD-10-CM | POA: Insufficient documentation

## 2011-05-05 LAB — GLUCOSE, CAPILLARY: Glucose-Capillary: 35 mg/dL — CL (ref 70–99)

## 2011-05-19 LAB — URINALYSIS, ROUTINE W REFLEX MICROSCOPIC
Bilirubin Urine: NEGATIVE
Hgb urine dipstick: NEGATIVE
Ketones, ur: NEGATIVE
Nitrite: NEGATIVE
Protein, ur: NEGATIVE
pH: 6.5

## 2011-05-19 LAB — CBC
Hemoglobin: 10.9 — ABNORMAL LOW
RBC: 3.3 — ABNORMAL LOW
WBC: 9.8

## 2011-05-19 LAB — COMPREHENSIVE METABOLIC PANEL
Albumin: 3.1 — ABNORMAL LOW
Alkaline Phosphatase: 49
BUN: 10
Chloride: 106
Glucose, Bld: 109 — ABNORMAL HIGH
Potassium: 4
Total Bilirubin: 0.4

## 2011-05-19 LAB — URIC ACID: Uric Acid, Serum: 5.4

## 2011-05-19 LAB — LACTATE DEHYDROGENASE: LDH: 121

## 2011-05-21 LAB — COMPREHENSIVE METABOLIC PANEL
ALT: 15 U/L (ref 0–35)
Albumin: 3 g/dL — ABNORMAL LOW (ref 3.5–5.2)
Alkaline Phosphatase: 55 U/L (ref 39–117)
CO2: 25 mEq/L (ref 19–32)
GFR calc Af Amer: >60 mL/min (ref >60)
Total Bilirubin: 0.8 mg/dL (ref 0.3–1.2)
Total Protein: 6.2 g/dL (ref 6.0–8.3)

## 2011-05-21 LAB — CBC
MCHC: 34.5 g/dL (ref 30.0–36.0)
MCV: 97.2 fL (ref 78.0–100.0)
RBC: 3.2 MIL/uL — ABNORMAL LOW (ref 3.87–5.11)

## 2011-05-27 LAB — HEPATIC FUNCTION PANEL
Albumin: 4.8
Alkaline Phosphatase: 84
Bilirubin, Direct: 0.3
Indirect Bilirubin: 1.2 — ABNORMAL HIGH
Total Bilirubin: 1.5 — ABNORMAL HIGH

## 2011-05-27 LAB — I-STAT 8, (EC8 V) (CONVERTED LAB)
BUN: 28 — ABNORMAL HIGH
Bicarbonate: 21.8
Glucose, Bld: 140 — ABNORMAL HIGH
Hemoglobin: 17.7 — ABNORMAL HIGH
Sodium: 134 — ABNORMAL LOW
pH, Ven: 7.447 — ABNORMAL HIGH

## 2011-05-27 LAB — URINALYSIS, ROUTINE W REFLEX MICROSCOPIC
Hgb urine dipstick: NEGATIVE
Nitrite: NEGATIVE
Specific Gravity, Urine: 1.013
Urobilinogen, UA: 0.2

## 2011-05-27 LAB — URINE MICROSCOPIC-ADD ON

## 2011-05-27 LAB — DIFFERENTIAL
Eosinophils Absolute: 0.1
Eosinophils Relative: 1
Lymphs Abs: 2.4
Monocytes Absolute: 0.9 — ABNORMAL HIGH
Monocytes Relative: 7

## 2011-05-27 LAB — LIPASE, BLOOD: Lipase: 27

## 2011-05-27 LAB — CBC
HCT: 48 — ABNORMAL HIGH
Hemoglobin: 16.3 — ABNORMAL HIGH
MCHC: 33.9
MCV: 89.3
RBC: 5.37 — ABNORMAL HIGH
WBC: 12.7 — ABNORMAL HIGH

## 2011-06-09 NOTE — Op Note (Signed)
  Alexis Campos, HIGLEY NO.:  [id number]  MEDICAL RECORD NO.:  [id number]  LOCATION:  [facility]LEN                         FACILITY:  [facility]  PHYSICIAN:  Graylin Shiver, M.D.   DATE OF BIRTH:  1965/10/26  DATE OF PROCEDURE:  05/04/2011 DATE OF DISCHARGE:                              OPERATIVE REPORT   INDICATION FOR PROCEDURE:  Epigastric pain.  Informed consent was obtained after explanation of the risks of bleeding, infection, and perforation.  PREMEDICATIONS: 1. Fentanyl 100 mcg IV. 2. Versed 8 mg IV. 3. Benadryl 25 mg IV.  PROCEDURE:  With the patient in the left lateral decubitus position, a Pentax gastroscope was inserted into the oropharynx and passed into the esophagus.  It was advanced down the esophagus then into the stomach and then into the duodenum.  The second portion and the bulb of the duodenum looked normal.  The stomach had a normal-appearing antrum and body of the stomach.  Upon retroflexion, there was some erythema noted in the fundus of the stomach which was biopsied.  The esophagus looked normal. She tolerated the procedure well without complications.  IMPRESSION:  Erythema in the fundus of the stomach which was biopsied, otherwise normal upper endoscopy.  PLAN:  I will plan to check the pathology and see if there is any evidence of inflammation or H. pylori.          ______________________________ Graylin Shiver, M.D.     SFG/MEDQ  D:  05/04/2011  T:  05/05/2011  Job:  [id number]  Electronically Signed by Herbert Moors MD on 06/09/2011 02:59:30 PM

## 2011-10-23 ENCOUNTER — Encounter (INDEPENDENT_AMBULATORY_CARE_PROVIDER_SITE_OTHER): Payer: Self-pay | Admitting: Surgery

## 2011-10-23 ENCOUNTER — Ambulatory Visit (INDEPENDENT_AMBULATORY_CARE_PROVIDER_SITE_OTHER): Payer: Commercial Managed Care - PPO | Admitting: Surgery

## 2011-10-23 DIAGNOSIS — R109 Unspecified abdominal pain: Secondary | ICD-10-CM | POA: Insufficient documentation

## 2011-10-23 NOTE — Progress Notes (Signed)
Re:   Alexis Campos DOB:   05-13-1966 MRN:   [id number]  ASSESSMENT AND PLAN: 1.  Abdominal pain, right abdomen.  Chronic.  Etiology unclear.  I doubt adhesions are the source of her pain.  I spent 20+ minutes going over the options with the patient.  I would use x-ray test only for specific questions and not as a general evaluation.  I think she should update her colonoscopy (she is already planning this) with Dr. Evette Cristal.  Her second decision is does she want more children.  She ought to go see Dr. Cherly Hensen and discuss this vs hysterectomy.  I see no role for general surgery at this time.  I would only consider diagnostic laparoscopy with very specific indications/findings and with the know possibility of limited results.   Her return to me is on a PRN basis.  2.  History of endometriosis. 3. Trying to decide about more children.  To talk to Dr. Nelta Numbers. 4.  Fibromyalgia. 5.  History of right kidney stones. 6.  Irritable bowel syndrome.  Sees Dr. Luan Moore. 7.  Hypertension since 1996. 8.  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  Sees Dr. Assunta Found. 9.  History of diverticulosis. 10.  Post ERCP pancreatitis in 1996.  Dr. Darrel Hoover GI doctor at that time.   Chief Complaint  Patient presents with  . Abdominal Pain   REFERRING PHYSICIAN: Pearson Grippe, MD, MD  HISTORY OF PRESENT ILLNESS: Alexis Campos is a 46 y.o. (DOB: March 07, 1966)  white female whose primary care physician is Pearson Grippe, MD, MD and comes to me today for chronic abdominal pain.  She had a lap chole 02/08/1995. She had undergone an exploratory laparoscopy, enterolysis, and appendectomy in 06/03/1995. The appendix had endometriosis.She had a diagnostic lap, lysis of adhesions, and liver biopsy on 04/17/1999. At that time, she had had 4 years of abdominal pain.  She also had a laparoscopy in about 2002 by a Gyn physician.  Her father had colon cancer.  She had a colonoscopy in 2008 which showed  diverticulosis.  She has recurrent chronic lower abdominal pain about 10 days before her periods. She attributes this to endometriosis.  She has a separate abdominal pain in her right abdomen and right upper abdomen that feels like a pressure symptoms. She says she gets nauseated 3-4 times a week and she vomits once or twice a month. There is no specific food which triggers the symptoms. She does admit to having irritable bowel syndrome and has alternating loose stools and constipated stools.  She underwent an upper endoscopy by Dr. Wandalee Ferdinand on 04 May 2011 which was negative. She also had an ultrasound of her abdomen on 27 April 2011 that was negative except for hepatic steatosis and nonobstructing right renal stones.  She says she has lost 15 pounds in the last 4-6 months.    Past Medical History  Diagnosis Date  . Blood transfusion   . Neuromuscular disorder     fibromyalgia  . GERD (gastroesophageal reflux disease)   . Diabetes mellitus   . Hypertension   . Hyperlipidemia   . Endometriosis       Past Surgical History  Procedure Date  . Tonsillectomy 09/1994  . Cholecystectomy 01/1995  . Lysis of adhesions 1996/ 2002  . Cesarean section   . Appendectomy 05/1995      Current Outpatient Prescriptions  Medication Sig Dispense Refill  . Acetaminophen (TYLENOL ARTHRITIS PAIN PO) Take by mouth as  needed.      Marland Kitchen CALCIUM-MAGNESIUM-VITAMIN D PO Take by mouth 2 (two) times daily.      . Esomeprazole Magnesium (NEXIUM PO) Take 40 mg by mouth 2 (two) times daily.      . Insulin Aspart (NOVOLOG [place]) Inject into the skin 3 (three) times daily. And with snacks      . insulin glargine (LANTUS) 100 UNIT/ML injection Inject 40 Units into the skin at bedtime.      . Lido-Capsaicin-Men-Methyl Sal (MEDI-PATCH-LIDOCAINE EX) Apply 1 patch topically as needed.      . metFORMIN (GLUCOPHAGE) 1000 MG tablet Take 1,000 mg by mouth 2 (two) times daily with a meal.      . METOPROLOL TARTRATE  PO Take 150 mg by mouth 2 (two) times daily.      . ondansetron (ZOFRAN) 4 MG tablet Take 4 mg by mouth as needed.      Marland Kitchen PRENATAL VITAMINS PO Take 1 tablet by mouth daily.          Allergies  Allergen Reactions  . Codeine Itching  . Reglan     Restless leg     REVIEW OF SYSTEMS: Skin:  No history of rash.  No history of abnormal moles. Infection:  No history of hepatitis or HIV.  No history of MRSA. Neurologic:  No history of stroke.  No history of seizure.  No history of headaches. Cardiac:  Hypertension since 1996. No history of heart disease.  No history of prior cardiac catheterization.   Pulmonary:  Does not smoke cigarettes.  No asthma or bronchitis.  No OSA/CPAP.  Endocrine:  Diabetes since 1996.  Now on insulin.  Sees Dr. Assunta Found. No thyroid disease. Gastrointestinal: See HPI. Urologic:  History of kidney stones.  No history of bladder infections. Musculoskeletal:  Has fibromyalgia of extremities. Hematologic:  No bleeding disorder.  No history of anemia.  Not anticoagulated. Psycho-social:  The patient is oriented.   The patient has no obvious psychologic or social impairment to understanding our conversation and plan.  SOCIAL and FAMILY HISTORY: Single.  Has 6 year old son. Takes care of mother. 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  PHYSICAL EXAM: BP 140/82  Pulse 72  Temp(Src) 97.2 F (36.2 C) (Oral)  Resp 18  Ht 5\' 10"  (1.778 m)  Wt 220 lb 8 oz (100.018 kg)  BMI 31.64 kg/m2  General: Mildly obese WF who is alert and generally healthy appearing.  HEENT: Normal. Pupils equal. Good dentition. Neck: Supple. No mass.  No thyroid mass.  Carotid pulse okay with no bruit. Lymph Nodes:  No supraclavicular or cervical nodes. Lungs: Clear to auscultation and symmetric breath sounds. Heart:  RRR. No murmur or rub. Abdomen: Soft. No mass. No tenderness. No hernia. Normal bowel sounds.  Has bruises from insulin shots.  Vague discomfort in right  abdomen.  Rectal: Not done. Extremities:  Good strength and ROM  in upper and lower extremities. Neurologic:  Grossly intact to motor and sensory function. Psychiatric: Has normal mood and affect. Behavior is normal.   DATA REVIEWED: Old chart and notes from Dr. Evette Cristal.  Ovidio Kin, MD,  [facility] [address] [place]  [address]   [place], [place]    [address] Phone:  [phone] FAX:  [phone]

## 2012-01-06 ENCOUNTER — Other Ambulatory Visit (HOSPITAL_COMMUNITY): Payer: Self-pay | Admitting: Internal Medicine

## 2012-01-06 DIAGNOSIS — Z1231 Encounter for screening mammogram for malignant neoplasm of breast: Secondary | ICD-10-CM

## 2012-02-02 ENCOUNTER — Ambulatory Visit (HOSPITAL_COMMUNITY): Payer: Self-pay

## 2012-02-07 ENCOUNTER — Emergency Department (HOSPITAL_COMMUNITY)
Admission: EM | Admit: 2012-02-07 | Discharge: 2012-02-07 | Disposition: A | Discharge status: Home / Self Care | Payer: 59 | Source: Home / Self Care | Attending: Emergency Medicine | Admitting: Emergency Medicine

## 2012-02-07 ENCOUNTER — Encounter (HOSPITAL_COMMUNITY): Payer: Self-pay

## 2012-02-07 DIAGNOSIS — J329 Chronic sinusitis, unspecified: Secondary | ICD-10-CM

## 2012-02-07 HISTORY — DX: Chronic sinusitis, unspecified: J32.9

## 2012-02-07 HISTORY — DX: Fibromyalgia: M79.7

## 2012-02-07 MED ORDER — AMOXICILLIN-POT CLAVULANATE 875-125 MG PO TABS: 1.0000 | ORAL_TABLET | Freq: Two times a day (BID) | ORAL | Status: AC

## 2012-02-07 MED ORDER — GUAIFENESIN ER 600 MG PO TB12: 600.0000 mg | ORAL_TABLET | Freq: Two times a day (BID) | ORAL | Status: DC

## 2012-02-07 MED ORDER — HYDROCODONE-ACETAMINOPHEN 7.5-500 MG/15ML PO SOLN: 5.0000 mL | Freq: Four times a day (QID) | ORAL | Status: AC | PRN

## 2012-02-07 MED ORDER — FLUTICASONE PROPIONATE 50 MCG/ACT NA SUSP: 2.0000 | Freq: Every day | NASAL | Status: DC

## 2012-02-07 NOTE — ED Notes (Signed)
Pt has headache, dizziness and yellowish sputum since Wednesday.  She also has nausea from drainage.

## 2012-02-07 NOTE — Discharge Instructions (Signed)
Take the medication as written. Return if you get worse, have a fever >100.4, or for any concerns. You may take 600 mg of motrin with 1 gram of tylenol up to 4 times a day as needed for pain. This is an effective combination for pain.  Most sinus infections are viral and do not need antibiotics unless you have a high fever, have had this for 10 day, or you get better and then get sick again. Use a neti pot or the NeilMed sinus rinse as often as you want to to reduce nasal congestion. Follow the directions on the box.   Go to www.goodrx.com to look up your medications. This will give you a list of where you can find your prescriptions at the most affordable prices.

## 2012-02-07 NOTE — ED Provider Notes (Signed)
History     CSN: [id number]  Arrival date & time 02/07/12  1219   First MD Initiated Contact with Patient 02/07/12 1222      Chief Complaint  Patient presents with  . URI    (Consider location/radiation/quality/duration/timing/severity/associated sxs/prior treatment) HPI Comments: Pt with rhinorrhea, postnasal drip, ST, nonproductive cough, fatigue x 5 days. Reports purulent nasal drainage, frontal sinus pressure/pain, states that her teeth hurt. Reports fevers Tmax 100.2 she states that she was getting better, but then got acutely worse again yesterday. Unable to sleep at night secondary to coughing. She's taking over-the-counter cold medications without relief. Reports some right ear congestion, and dizziness but no ear pain. No wheeze, SOB, abd pain, rash, N/V. Slightly decreased appetite but is tolerating po.  Patient states that her daughter and mother have an identical illness. She says that this feels similar to previous episodes of sinusitis.  ROS as noted in HPI. All other ROS negative.     Patient is a 46 y.o. female presenting with URI. The history is provided by the patient. No language interpreter was used.  URI The primary symptoms include fever and headaches. The current episode started 6 to 7 days ago. This is a new problem. The problem has been gradually worsening.  The onset of the illness is associated with exposure to sick contacts. Symptoms associated with the illness include facial pain, sinus pressure and congestion. Risk factors for severe complications from URI include diabetes mellitus.    Past Medical History  Diagnosis Date  . Blood transfusion   . Neuromuscular disorder     fibromyalgia  . GERD (gastroesophageal reflux disease)   . Diabetes mellitus   . Hypertension   . Hyperlipidemia   . Endometriosis   . Fibromyalgia   . Sinusitis     Past Surgical History  Procedure Date  . Tonsillectomy 09/1994  . Cholecystectomy 01/1995  . Lysis of  adhesions 1996/ 2002  . Cesarean section   . Appendectomy 05/1995  . Abdominal surgery     LOA for endomeriosis    History reviewed. No pertinent family history.  History  Substance Use Topics  . Smoking status: Never Smoker   . Smokeless tobacco: Not on file  . Alcohol Use: No    OB History    Grav Para Term Preterm Abortions TAB SAB Ect Mult Living                  Review of Systems  Constitutional: Positive for fever.  HENT: Positive for congestion and sinus pressure.   Neurological: Positive for headaches.    Allergies  Codeine and Metoclopramide hcl  Home Medications   Current Outpatient Rx  Name Route Sig Dispense Refill  . TYLENOL ARTHRITIS PAIN PO Oral Take by mouth as needed.    . AMOXICILLIN-POT CLAVULANATE 875-125 MG PO TABS Oral Take 1 tablet by mouth 2 (two) times daily. X 7 days 14 tablet 0  . CALCIUM-MAGNESIUM-VITAMIN D PO Oral Take by mouth 2 (two) times daily.    Marland Kitchen NEXIUM PO Oral Take 40 mg by mouth 2 (two) times daily.    Marland Kitchen FLUTICASONE PROPIONATE 50 MCG/ACT NA SUSP Nasal Place 2 sprays into the nose daily. 16 g 0  . GUAIFENESIN ER 600 MG PO TB12 Oral Take 1 tablet (600 mg total) by mouth 2 (two) times daily. 14 tablet 0  . HYDROCODONE-ACETAMINOPHEN 7.5-500 MG/15ML PO SOLN Oral Take 5 mLs by mouth every 6 (six) hours as needed for pain. 120  mL 0  . NOVOLOG [place] Subcutaneous Inject into the skin 3 (three) times daily. And with snacks    . INSULIN GLARGINE 100 UNIT/ML [place] SOLN Subcutaneous Inject 40 Units into the skin at bedtime.    Marland Kitchen MEDI-PATCH-LIDOCAINE EX Apply externally Apply 1 patch topically as needed.    Marland Kitchen METFORMIN HCL 1000 MG PO TABS Oral Take 1,000 mg by mouth 2 (two) times daily with a meal.    . METOPROLOL TARTRATE PO Oral Take 150 mg by mouth 2 (two) times daily.      BP 132/89  Pulse 80  Temp 98.7 F (37.1 C) (Oral)  Resp 16  SpO2 96%  LMP 01/18/2012  Physical Exam  Nursing note and vitals reviewed. Constitutional: She is oriented  to person, place, and time. She appears well-developed and well-nourished.  HENT:  Head: Normocephalic and atraumatic.  Right Ear: Ear canal normal. Tympanic membrane is scarred. Tympanic membrane is not retracted.  Left Ear: Ear canal normal. Tympanic membrane is scarred.  Nose: Mucosal edema and rhinorrhea present. No epistaxis. Right sinus exhibits maxillary sinus tenderness and frontal sinus tenderness.  Mouth/Throat: Uvula is midline, oropharynx is clear and moist and mucous membranes are normal.       Purulent nasal drainage  Eyes: Conjunctivae and EOM are normal. Pupils are equal, round, and reactive to light.  Neck: Normal range of motion. Neck supple.  Cardiovascular: Normal rate, regular rhythm and normal heart sounds.   Pulmonary/Chest: Effort normal and breath sounds normal.  Abdominal: She exhibits no distension.  Musculoskeletal: Normal range of motion.  Lymphadenopathy:    She has no cervical adenopathy.  Neurological: She is alert and oriented to person, place, and time.  Skin: Skin is warm and dry. No rash noted.  Psychiatric: She has a normal mood and affect. Her behavior is normal. Judgment and thought content normal.    ED Course  Procedures (including critical care time)  Labs Reviewed - No data to display No results found.   1. Sinusitis       MDM   Pt with indications for abx. Will start augmentin, doxycycline in addition to flonase, mucinex, saline nasal irrigation, increase fluids, tylenol/motrin prn pain. Discussed MDM and plan with pt. Pt agrees with plan and will f/u with PMD prn.    Luiz Blare, MD 02/07/12 1455

## 2012-02-26 ENCOUNTER — Ambulatory Visit (HOSPITAL_COMMUNITY): Payer: 59

## 2012-04-30 ENCOUNTER — Encounter (HOSPITAL_COMMUNITY): Payer: Self-pay | Admitting: *Deleted

## 2012-04-30 ENCOUNTER — Emergency Department (HOSPITAL_COMMUNITY)
Admission: EM | Admit: 2012-04-30 | Discharge: 2012-04-30 | Disposition: A | Discharge status: Home / Self Care | Payer: 59 | Source: Home / Self Care | Attending: Family Medicine | Admitting: Family Medicine

## 2012-04-30 ENCOUNTER — Emergency Department (INDEPENDENT_AMBULATORY_CARE_PROVIDER_SITE_OTHER): Payer: 59

## 2012-04-30 DIAGNOSIS — J209 Acute bronchitis, unspecified: Secondary | ICD-10-CM

## 2012-04-30 NOTE — ED Notes (Signed)
Pt with cough congestion/fever/bodyaches x 10 days seen by own md Monday started on levofloxacin - per pt called md office Wednesday due to no relief in symptoms - started on doxycycline - pt continues with symptoms and fever - coughing more vomiting with coughing spells chest sore with coughing

## 2012-04-30 NOTE — ED Provider Notes (Signed)
History     CSN: [id number]  Arrival date & time 04/30/12  1153   First MD Initiated Contact with Patient 04/30/12 1209      Chief Complaint  Patient presents with  . Nasal Congestion  . Fever  . Generalized Body Aches  . Cough  . Nausea  . Emesis    (Consider location/radiation/quality/duration/timing/severity/associated sxs/prior treatment) Patient is a 46 y.o. female presenting with cough. The history is provided by the patient.  Cough This is a new problem. The current episode started more than 1 week ago. The problem has been gradually worsening. The cough is productive of purulent sputum. The maximum temperature recorded prior to her arrival was 101 to 101.9 F. Associated symptoms include chest pain. Pertinent negatives include no rhinorrhea and no sore throat. Treatments tried: seen 6 days ago by lmd, started on levaquin then changed to doxy on wed, sx of cough, congestion and fever since. She is not a smoker.    Past Medical History  Diagnosis Date  . Blood transfusion   . Neuromuscular disorder     fibromyalgia  . GERD (gastroesophageal reflux disease)   . Diabetes mellitus   . Hypertension   . Hyperlipidemia   . Endometriosis   . Fibromyalgia   . Sinusitis     Past Surgical History  Procedure Date  . Tonsillectomy 09/1994  . Cholecystectomy 01/1995  . Lysis of adhesions 1996/ 2002  . Cesarean section   . Appendectomy 05/1995  . Abdominal surgery     LOA for endomeriosis    History reviewed. No pertinent family history.  History  Substance Use Topics  . Smoking status: Never Smoker   . Smokeless tobacco: Not on file  . Alcohol Use: No    OB History    Grav Para Term Preterm Abortions TAB SAB Ect Mult Living                  Review of Systems  Constitutional: Positive for fever and appetite change.  HENT: Negative for sore throat and rhinorrhea.   Respiratory: Positive for cough.   Cardiovascular: Positive for chest pain.    Allergies    Codeine and Metoclopramide hcl  Home Medications   Current Outpatient Rx  Name Route Sig Dispense Refill  . HYDROCOD POLST-CPM POLST ER 10-8 MG/5ML PO LQCR Oral Take 5 mLs by mouth.    . DOXYCYCLINE HYCLATE 100 MG PO TBEC Oral Take 100 mg by mouth 2 (two) times daily.    Marland Kitchen NEXIUM PO Oral Take 40 mg by mouth 2 (two) times daily.    Marland Kitchen FLUTICASONE PROPIONATE 50 MCG/ACT NA SUSP Nasal Place 2 sprays into the nose daily. 16 g 0  . GUAIFENESIN ER 600 MG PO TB12 Oral Take 1 tablet (600 mg total) by mouth 2 (two) times daily. 14 tablet 0  . NOVOLOG [place] Subcutaneous Inject into the skin 3 (three) times daily. And with snacks    . INSULIN GLARGINE 100 UNIT/ML [place] SOLN Subcutaneous Inject 40 Units into the skin at bedtime.    Marland Kitchen LEVOFLOXACIN 750 MG PO TABS Oral Take 750 mg by mouth daily.    Marland Kitchen MEDI-PATCH-LIDOCAINE EX Apply externally Apply 1 patch topically as needed.    Marland Kitchen METFORMIN HCL 1000 MG PO TABS Oral Take 1,000 mg by mouth 2 (two) times daily with a meal.    . METOPROLOL TARTRATE PO Oral Take 150 mg by mouth 2 (two) times daily.    Ronney Asters ARTHRITIS PAIN PO  Oral Take by mouth as needed.    Marland Kitchen CALCIUM-MAGNESIUM-VITAMIN D PO Oral Take by mouth 2 (two) times daily.      BP 146/93  Pulse 78  Temp 98 F (36.7 C) (Oral)  Resp 18  SpO2 100%  LMP 04/12/2012  Physical Exam  Nursing note and vitals reviewed. Constitutional: She is oriented to person, place, and time. She appears well-developed and well-nourished.  HENT:  Head: Normocephalic.  Right Ear: External ear normal.  Left Ear: External ear normal.  Mouth/Throat: Oropharynx is clear and moist.  Eyes: Conjunctivae normal are normal. Pupils are equal, round, and reactive to light.  Neck: Normal range of motion. Neck supple. No thyromegaly present.  Cardiovascular: Normal rate, regular rhythm, normal heart sounds and intact distal pulses.   Pulmonary/Chest: Effort normal. She has rales.  Lymphadenopathy:    She has no cervical  adenopathy.  Neurological: She is alert and oriented to person, place, and time.  Skin: Skin is warm and dry.    ED Course  Procedures (including critical care time)  Labs Reviewed - No data to display Dg Chest 2 View  04/30/2012  *RADIOLOGY REPORT*  Clinical Data: Cough and fever  CHEST - 2 VIEW  Comparison: None.  Findings: 1.0 cm nodule in the superior segment of the left lower lobe.  Otherwise clear lungs.  No pneumothorax and no pleural effusion.  Cardiomediastinal silhouette is within normal limits.  IMPRESSION: Left lower lobe pulmonary nodule.  CT is recommended.   Original Report Authenticated By: Donavan Burnet, M.D.      1. Bronchitis, acute       MDM  X-rays reviewed and report per radiologist.         Linna Hoff, MD 04/30/12 [phone]

## 2012-05-05 ENCOUNTER — Other Ambulatory Visit (HOSPITAL_COMMUNITY): Payer: Self-pay | Admitting: Internal Medicine

## 2012-05-05 DIAGNOSIS — R911 Solitary pulmonary nodule: Secondary | ICD-10-CM

## 2012-05-06 ENCOUNTER — Ambulatory Visit (HOSPITAL_COMMUNITY)
Admission: RE | Admit: 2012-05-06 | Discharge: 2012-05-06 | Disposition: A | Discharge status: Home / Self Care | Payer: 59 | Source: Ambulatory Visit | Attending: Internal Medicine | Admitting: Internal Medicine

## 2012-05-06 DIAGNOSIS — N200 Calculus of kidney: Secondary | ICD-10-CM | POA: Insufficient documentation

## 2012-05-06 DIAGNOSIS — R911 Solitary pulmonary nodule: Secondary | ICD-10-CM | POA: Insufficient documentation

## 2012-05-06 MED ORDER — IOHEXOL 300 MG/ML  SOLN
80.0000 mL | Freq: Once | INTRAMUSCULAR | Status: AC | PRN
  Administered 2012-05-06: 80 mL via INTRAVENOUS

## 2012-05-24 ENCOUNTER — Other Ambulatory Visit: Payer: Self-pay | Admitting: Obstetrics and Gynecology

## 2012-05-26 ENCOUNTER — Encounter (HOSPITAL_COMMUNITY): Payer: Self-pay

## 2012-05-26 ENCOUNTER — Encounter (HOSPITAL_COMMUNITY): Payer: Self-pay | Admitting: Student-PharmD

## 2012-05-26 ENCOUNTER — Encounter (HOSPITAL_COMMUNITY)
Admission: RE | Admit: 2012-05-26 | Discharge: 2012-05-26 | Disposition: A | Discharge status: Home / Self Care | Payer: 59 | Source: Ambulatory Visit | Attending: Obstetrics and Gynecology | Admitting: Obstetrics and Gynecology

## 2012-05-26 HISTORY — DX: Other seasonal allergic rhinitis: J30.2

## 2012-05-26 HISTORY — DX: Personal history of other medical treatment: Z92.89

## 2012-05-26 LAB — CBC
HCT: 32.6 % — ABNORMAL LOW (ref 36.0–46.0)
MCHC: 32.5 g/dL (ref 30.0–36.0)
Platelets: 245 10*3/uL (ref 150–400)
RDW: 14.2 % (ref 11.5–15.5)
WBC: 5.9 10*3/uL (ref 4.0–10.5)

## 2012-05-26 NOTE — Pre-Procedure Instructions (Signed)
Spoke with Dr Sheral Apley concerning patient's care-Diabetes on Insulin-Lantus in pm-40 units-take 1/2 dose, hold am of surgery, hold metformin x 24 hours. Take metoprolol and nexium. Will do ekg at pat visit-requested last one done at [facility] 1 year ago to be faxed. Last A1C 5.3 per pt. LSD ok per Rushie Goltz.

## 2012-05-26 NOTE — Patient Instructions (Addendum)
   Your procedure is scheduled on: Tuesday October 15th  Enter through the [employer] of [facility] at:8am Pick up the phone at the desk and dial [phone] and inform us of your arrival.  Please call this number if you have any problems the morning of surgery: [phone]  Remember: Do not eat or drink after midnight on Monday Please take your Metoprolol and Nexium with sips of water on day of surgery Please hold your metformin on Monday evening and Tuesday Morning dose Please take 1/2 of your evening dose of insulin Please hold your morning dose of insulin  Do not wear jewelry, make-up, or FINGER nail polish No metal in your hair or on your body. Do not wear lotions, powders, perfumes. You may wear deodorant.  Please use your CHG wash as directed prior to surgery.  Do not shave anywhere for at least 12 hours prior to first CHG shower.  Do not bring valuables to the hospital.     Patients discharged on the day of surgery will not be allowed to drive home.

## 2012-05-31 ENCOUNTER — Encounter (HOSPITAL_COMMUNITY): Payer: Self-pay | Admitting: Anesthesiology

## 2012-05-31 ENCOUNTER — Encounter (HOSPITAL_COMMUNITY)
Admission: RE | Disposition: A | Discharge status: Home / Self Care | Payer: Self-pay | Source: Ambulatory Visit | Attending: Obstetrics and Gynecology

## 2012-05-31 ENCOUNTER — Ambulatory Visit (HOSPITAL_COMMUNITY)
Admission: RE | Admit: 2012-05-31 | Discharge: 2012-05-31 | Disposition: A | Discharge status: Home / Self Care | Payer: 59 | Source: Ambulatory Visit | Attending: Obstetrics and Gynecology | Admitting: Obstetrics and Gynecology

## 2012-05-31 ENCOUNTER — Ambulatory Visit (HOSPITAL_COMMUNITY): Payer: 59 | Admitting: Anesthesiology

## 2012-05-31 DIAGNOSIS — Z01818 Encounter for other preprocedural examination: Secondary | ICD-10-CM | POA: Insufficient documentation

## 2012-05-31 DIAGNOSIS — N840 Polyp of corpus uteri: Secondary | ICD-10-CM | POA: Insufficient documentation

## 2012-05-31 DIAGNOSIS — Z01812 Encounter for preprocedural laboratory examination: Secondary | ICD-10-CM | POA: Insufficient documentation

## 2012-05-31 DIAGNOSIS — N920 Excessive and frequent menstruation with regular cycle: Secondary | ICD-10-CM | POA: Insufficient documentation

## 2012-05-31 LAB — GLUCOSE, CAPILLARY: Glucose-Capillary: 101 mg/dL — ABNORMAL HIGH (ref 70–99)

## 2012-05-31 SURGERY — DILATATION & CURETTAGE/HYSTEROSCOPY WITH RESECTOCOPE
Anesthesia: General | Site: Uterus | Wound class: Clean Contaminated

## 2012-05-31 MED ORDER — KETOROLAC TROMETHAMINE 30 MG/ML IJ SOLN
INTRAMUSCULAR | Status: DC | PRN
  Administered 2012-05-31: 30 mg via INTRAVENOUS
  Administered 2012-05-31: 30 mg via INTRAMUSCULAR

## 2012-05-31 MED ORDER — FENTANYL CITRATE 0.05 MG/ML IJ SOLN
INTRAMUSCULAR | Status: AC
  Filled 2012-05-31: qty 5

## 2012-05-31 MED ORDER — GLYCINE 1.5 % IR SOLN
Status: DC | PRN
  Administered 2012-05-31: 3000 mL

## 2012-05-31 MED ORDER — KETOROLAC TROMETHAMINE 30 MG/ML IJ SOLN
INTRAMUSCULAR | Status: AC
  Filled 2012-05-31: qty 2

## 2012-05-31 MED ORDER — LIDOCAINE HCL (CARDIAC) 20 MG/ML IV SOLN
INTRAVENOUS | Status: DC | PRN
Start: 2012-05-31 — End: 2012-05-31
  Administered 2012-05-31: 50 mg via INTRAVENOUS

## 2012-05-31 MED ORDER — ONDANSETRON HCL 4 MG/2ML IJ SOLN: 4.0000 mg | Freq: Once | INTRAMUSCULAR | Status: DC | PRN

## 2012-05-31 MED ORDER — DEXAMETHASONE SODIUM PHOSPHATE 10 MG/ML IJ SOLN
INTRAMUSCULAR | Status: AC
  Filled 2012-05-31: qty 1

## 2012-05-31 MED ORDER — MIDAZOLAM HCL 5 MG/5ML IJ SOLN
INTRAMUSCULAR | Status: DC | PRN
  Administered 2012-05-31: 2 mg via INTRAVENOUS

## 2012-05-31 MED ORDER — FENTANYL CITRATE 0.05 MG/ML IJ SOLN
INTRAMUSCULAR | Status: AC
  Filled 2012-05-31: qty 2

## 2012-05-31 MED ORDER — MEPERIDINE HCL 25 MG/ML IJ SOLN: 6.2500 mg | INTRAMUSCULAR | Status: DC | PRN

## 2012-05-31 MED ORDER — MIDAZOLAM HCL 2 MG/2ML IJ SOLN
INTRAMUSCULAR | Status: AC
  Filled 2012-05-31: qty 2

## 2012-05-31 MED ORDER — ONDANSETRON HCL 4 MG/2ML IJ SOLN
INTRAMUSCULAR | Status: DC | PRN
  Administered 2012-05-31: 4 mg via INTRAVENOUS

## 2012-05-31 MED ORDER — CHLOROPROCAINE HCL 1 % IJ SOLN
INTRAMUSCULAR | Status: AC
  Filled 2012-05-31: qty 30

## 2012-05-31 MED ORDER — PROPOFOL 10 MG/ML IV EMUL
INTRAVENOUS | Status: DC | PRN
  Administered 2012-05-31: 250 mg via INTRAVENOUS

## 2012-05-31 MED ORDER — IBUPROFEN 800 MG PO TABS: 800.0000 mg | ORAL_TABLET | Freq: Three times a day (TID) | ORAL | Status: DC | PRN

## 2012-05-31 MED ORDER — FENTANYL CITRATE 0.05 MG/ML IJ SOLN
25.0000 ug | INTRAMUSCULAR | Status: DC | PRN
  Administered 2012-05-31 (×2): 50 ug via INTRAVENOUS

## 2012-05-31 MED ORDER — PROPOFOL 10 MG/ML IV EMUL
INTRAVENOUS | Status: AC
  Filled 2012-05-31: qty 20

## 2012-05-31 MED ORDER — GLYCOPYRROLATE 0.2 MG/ML IJ SOLN
INTRAMUSCULAR | Status: DC | PRN
  Administered 2012-05-31: 0.2 mg via INTRAVENOUS

## 2012-05-31 MED ORDER — CHLOROPROCAINE HCL 1 % IJ SOLN
INTRAMUSCULAR | Status: DC | PRN
  Administered 2012-05-31: 10 mL

## 2012-05-31 MED ORDER — LACTATED RINGERS IV SOLN
INTRAVENOUS | Status: DC
  Administered 2012-05-31 (×2): via INTRAVENOUS

## 2012-05-31 MED ORDER — DEXAMETHASONE SODIUM PHOSPHATE 4 MG/ML IJ SOLN
INTRAMUSCULAR | Status: DC | PRN
Start: 2012-05-31 — End: 2012-05-31
  Administered 2012-05-31: 8 mg via INTRAVENOUS

## 2012-05-31 MED ORDER — GLYCOPYRROLATE 0.2 MG/ML IJ SOLN
INTRAMUSCULAR | Status: AC
  Filled 2012-05-31: qty 1

## 2012-05-31 MED ORDER — ONDANSETRON HCL 4 MG/2ML IJ SOLN
INTRAMUSCULAR | Status: AC
  Filled 2012-05-31: qty 2

## 2012-05-31 MED ORDER — KETOROLAC TROMETHAMINE 30 MG/ML IJ SOLN: 15.0000 mg | Freq: Once | INTRAMUSCULAR | Status: DC | PRN

## 2012-05-31 MED ORDER — FENTANYL CITRATE 0.05 MG/ML IJ SOLN
INTRAMUSCULAR | Status: DC | PRN
  Administered 2012-05-31 (×3): 50 ug via INTRAVENOUS
  Administered 2012-05-31: 100 ug via INTRAVENOUS

## 2012-05-31 MED ORDER — EPHEDRINE SULFATE 50 MG/ML IJ SOLN
INTRAMUSCULAR | Status: DC | PRN
Start: 2012-05-31 — End: 2012-05-31
  Administered 2012-05-31 (×3): 5 mg via INTRAVENOUS

## 2012-05-31 MED ORDER — EPHEDRINE 5 MG/ML INJ
INTRAVENOUS | Status: AC
  Filled 2012-05-31: qty 10

## 2012-05-31 MED ORDER — LIDOCAINE HCL (CARDIAC) 20 MG/ML IV SOLN
INTRAVENOUS | Status: AC
  Filled 2012-05-31: qty 5

## 2012-05-31 SURGICAL SUPPLY — 16 items
CANISTER SUCTION 2500CC (MISCELLANEOUS) ×2 IMPLANT
CATH ROBINSON RED A/P 16FR (CATHETERS) ×2 IMPLANT
CLOTH BEACON ORANGE TIMEOUT ST (SAFETY) ×2 IMPLANT
CONTAINER PREFILL 10% NBF 60ML (FORM) ×4 IMPLANT
DRESSING TELFA 8X3 (GAUZE/BANDAGES/DRESSINGS) ×2 IMPLANT
ELECT REM PT RETURN 9FT ADLT (ELECTROSURGICAL) ×2
ELECTRODE REM PT RTRN 9FT ADLT (ELECTROSURGICAL) ×1 IMPLANT
GLOVE BIO SURGEON STRL SZ 6.5 (GLOVE) ×2 IMPLANT
GLOVE BIOGEL PI IND STRL 7.0 (GLOVE) ×2 IMPLANT
GLOVE BIOGEL PI INDICATOR 7.0 (GLOVE) ×2
GOWN STRL REIN XL XLG (GOWN DISPOSABLE) ×4 IMPLANT
LOOP ANGLED CUTTING 22FR (CUTTING LOOP) ×1 IMPLANT
PACK HYSTEROSCOPY LF (CUSTOM PROCEDURE TRAY) ×2 IMPLANT
PAD OB MATERNITY 4.3X12.25 (PERSONAL CARE ITEMS) ×2 IMPLANT
TOWEL OR 17X24 6PK STRL BLUE (TOWEL DISPOSABLE) ×4 IMPLANT
WATER STERILE IRR 1000ML POUR (IV SOLUTION) ×2 IMPLANT

## 2012-05-31 NOTE — Anesthesia Preprocedure Evaluation (Signed)
Anesthesia Evaluation  Patient identified by MRN, date of birth, ID band Patient awake    Reviewed: Allergy & Precautions, H&P , NPO status , Patient's Chart, lab work & pertinent test results, reviewed documented beta blocker date and time   Airway Mallampati: II TM Distance: >3 FB Neck ROM: full    Dental No notable dental hx. (+) Teeth Intact   Pulmonary neg pulmonary ROS,    Pulmonary exam normal       Cardiovascular hypertension, Pt. on home beta blockers     Neuro/Psych negative psych ROS   GI/Hepatic Neg liver ROS, GERD-  Medicated and Controlled,  Endo/Other  diabetes, Type 1, Insulin Dependent  Renal/GU negative Renal ROS  negative genitourinary   Musculoskeletal   Abdominal Normal abdominal exam  (+)   Peds negative pediatric ROS (+)  Hematology negative hematology ROS (+)   Anesthesia Other Findings   Reproductive/Obstetrics negative OB ROS                           Anesthesia Physical Anesthesia Plan  ASA: III  Anesthesia Plan: General   Post-op Pain Management:    Induction: Intravenous  Airway Management Planned: LMA  Additional Equipment:   Intra-op Plan:   Post-operative Plan:   Informed Consent: I have reviewed the patients History and Physical, chart, labs and discussed the procedure including the risks, benefits and alternatives for the proposed anesthesia with the patient or authorized representative who has indicated his/her understanding and acceptance.     Plan Discussed with: CRNA and Surgeon  Anesthesia Plan Comments:         Anesthesia Quick Evaluation

## 2012-05-31 NOTE — Brief Op Note (Signed)
05/31/2012  10:42 AM  PATIENT:  Alexis Campos  46 y.o. female  PRE-OPERATIVE DIAGNOSIS:  Menorrhagia, Endometrial Polyp  POST-OPERATIVE DIAGNOSIS:  Menorrhagia, Endometrial Polyp  PROCEDURE: DIAGNOSTIC HYSTEROSCOPY, HYSTEROSCOPIC RESECTION OF ENDOMETRIAL POLYP, DILATION AND CURRETTAGE  SURGEON:  Surgeon(s) and Role:    * Arshan Jabs Cathie Beams, MD - Primary  PHYSICIAN ASSISTANT:   ASSISTANTS: none   ANESTHESIA:   general and paracervical block FINDINGS: MULTIPLE POLYPS, NL ENDOCERVICAL CANAL, TUBAL OSTIA SEEN EBL:  Total I/O In: 1700 [I.V.:1700] Out: 70 [Urine:50; Blood:20]  BLOOD ADMINISTERED:none  DRAINS: none   LOCAL MEDICATIONS USED:  OTHER NESICAINE  SPECIMEN:  Source of Specimen:  EMC, ENDOMETRIAL POLYP  DISPOSITION OF SPECIMEN:  PATHOLOGY  COUNTS:  YES  TOURNIQUET:  * No tourniquets in log *  DICTATION: .Other Dictation: Dictation Number   PLAN OF CARE: Discharge to home after PACU  PATIENT DISPOSITION:  PACU - hemodynamically stable.   Delay start of Pharmacological VTE agent (>24hrs) due to surgical blood loss or risk of bleeding: NO

## 2012-05-31 NOTE — Transfer of Care (Signed)
Immediate Anesthesia Transfer of Care Note  Patient: Alexis Campos  Procedure(s) Performed: Procedure(s) (LRB) with comments: DILATATION & CURETTAGE/HYSTEROSCOPY WITH RESECTOCOPE (N/A)  Patient Location: PACU  Anesthesia Type: General  Level of Consciousness: awake, alert  and oriented  Airway & Oxygen Therapy: Patient Spontanous Breathing and Patient connected to nasal cannula oxygen  Post-op Assessment: Report given to PACU RN and Post -op Vital signs reviewed and stable  Post vital signs: Reviewed and stable  Complications: No apparent anesthesia complications

## 2012-05-31 NOTE — Anesthesia Postprocedure Evaluation (Signed)
Anesthesia Post Note  Patient: Alexis Campos  Procedure(s) Performed: Procedure(s) (LRB): DILATATION & CURETTAGE/HYSTEROSCOPY WITH RESECTOCOPE (N/A)  Anesthesia type: General  Patient location: PACU  Post pain: Pain level controlled  Post assessment: Post-op Vital signs reviewed  Last Vitals:  Filed Vitals:   05/31/12 0817  BP: 151/83  Pulse: 74  Temp: 36.7 C  Resp: 16    Post vital signs: Reviewed  Level of consciousness: sedated  Complications: No apparent anesthesia complicationsfj

## 2012-06-01 NOTE — Op Note (Signed)
Alexis Campos, WARSHAWSKY            ACCOUNT NO.:  [id number]  MEDICAL RECORD NO.:  [id number]  LOCATION:  [facility]                          FACILITY:  [facility]  PHYSICIAN:  Maxie Better, M.D.DATE OF BIRTH:  02/05/1966  DATE OF PROCEDURE:  05/31/2012 DATE OF DISCHARGE:  05/31/2012                              OPERATIVE REPORT   PREOPERATIVE DIAGNOSIS:  Menorrhagia, endometrial polyps.  POSTOPERATIVE DIAGNOSIS:  Menorrhagia, endometrial polyps.  PROCEDURES:  Diagnostic hysteroscopy, hysteroscopic resection of endometrial polyps, D and C.  ANESTHESIA:  General, paracervical block.  SURGEON:  Maxie Better, MD  ASSISTANT:  None.  PROCEDURE:  Under adequate general anesthesia, the patient was placed in the dorsal lithotomy position.  She was sterilely prepped and draped in usual fashion.  The bladder was catheterized for large amount of urine. Examination under anesthesia revealed an anteverted uterus.  No adnexal masses could be appreciated.  A bivalve speculum was placed in the vagina.  Single-tooth tenaculum was placed on the anterior lip of the Cervix. 1% Nesicaine was injected para cervically at 3  and 9 o' clock position.  The cervix was serially dilated up to a #31 Pratt dilator.  A glycine-primed resectoscope was introduced into the uterine cavity. Large amount of polypoid lesions were noted.  The left tubal ostia was seen and the right ostia was partially seen due to polyps.  The polyps were resected, the resectoscope was removed.  The cavity was curetted.  The resectoscope was inserted. Blunt and sharp dissection was occurred until all polypoid tissue was removed, at which time, all instruments were then removed from the vagina.  SPECIMEN:  Labeled endometrial polyps and endometrial curettings sent to Pathology.  ESTIMATED BLOOD LOSS:  Minimal.  FLUID DEFICIT:  300 mL.  COMPLICATION:  None.  The patient tolerated the procedure well, was transferred to the recovery  room in stable condition.     Maxie Better, M.D.     [place]/MEDQ  D:  05/31/2012  T:  06/01/2012  Job:  [id number]

## 2012-06-28 ENCOUNTER — Other Ambulatory Visit (HOSPITAL_COMMUNITY): Payer: Self-pay | Admitting: Obstetrics and Gynecology

## 2012-06-28 DIAGNOSIS — Z1231 Encounter for screening mammogram for malignant neoplasm of breast: Secondary | ICD-10-CM

## 2012-07-12 ENCOUNTER — Ambulatory Visit: Payer: 59

## 2012-07-20 ENCOUNTER — Ambulatory Visit (HOSPITAL_COMMUNITY): Payer: 59

## 2012-09-18 ENCOUNTER — Inpatient Hospital Stay (HOSPITAL_COMMUNITY)
Admission: AD | Admit: 2012-09-18 | Discharge: 2012-09-18 | Disposition: A | Discharge status: Home / Self Care | Payer: 59 | Source: Ambulatory Visit | Attending: Obstetrics and Gynecology | Admitting: Obstetrics and Gynecology

## 2012-09-18 ENCOUNTER — Encounter (HOSPITAL_COMMUNITY): Payer: Self-pay | Admitting: Obstetrics and Gynecology

## 2012-09-18 DIAGNOSIS — Z3189 Encounter for other procreative management: Secondary | ICD-10-CM | POA: Insufficient documentation

## 2012-09-18 NOTE — MAU Note (Signed)
Pt is ovulating and is here to have artificial insemination

## 2012-09-18 NOTE — Procedures (Signed)
Consent signed. IUI done with [phone] after confirming >50% motility on slide. No complicated. Cook catheter used from our office

## 2012-11-15 ENCOUNTER — Ambulatory Visit (INDEPENDENT_AMBULATORY_CARE_PROVIDER_SITE_OTHER): Payer: Self-pay | Admitting: Family Medicine

## 2012-11-15 VITALS — BP 122/88 | Wt 220.0 lb

## 2012-11-15 DIAGNOSIS — E119 Type 2 diabetes mellitus without complications: Secondary | ICD-10-CM | POA: Insufficient documentation

## 2012-11-15 NOTE — Progress Notes (Signed)
Patient presents today for 3 mo DM follow-up as part of the employer-sponsored Link to Wellness program. Patient will follow-up with Dr. Talmage Nap in April. No new med changes. Patient recently switched from Nexium to Protonix for cost savings; however, symptoms are not adequately controlled and patient is interested in changing back to Nexium. I will fax MD with request. 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  Diabetes Assessment: Type of Diabetes: Type 2; Sees Diabetes provider 2 times per year; MD managing Diabetes Dr. Talmage Nap; checks feet daily; takes an aspirin a day; checks blood glucose 1-2 times a week; Hypoglycemia frequency occasionally Other history: Patient is currently taking Lantus 40-50 units (usually 40 units daily) and Novolog sliding scale with meals (up to 25 units per meal). Patient did not bring meter today but reports testing 1-2 times per week. Per patient report fasting readings are 80-120. Patient continues to notice hypoglycemia on occassion. One episode was following a 50 unit Lantus dose. Patient developed symptoms of hypoglycemia about 8 hours after the dose and corrected with cheese and crackers. She has noticed this on one other occassion following a 50 unit dose. I have suggested that 50 units may be too much and patient should increase Lantus by only 1-2 units if she feels this is necessary. Patient is up to date on eye exam.   Lifestyle Factors: Diet - diet has deteriorated since last visit. Patient is eating more fast food and admits that her diet has not been healthy. Fast food is convenient and easy for patient. We have discussed returning to a more healthy diet.  Exercise - No routine exercise at this time. Patient continues to stay active with her son, Weston Brass. He is currently playing t-ball and patient will attempt to walk during his practices. She is also interested in purchasing a basketball goal as this  is an interest of hers, and she feels she would enjoy basketball more than walking or jogging. Still has [employer] but not using it at this time.   Assessment: Patient will follow-up with MD in~3 weeks. I have encouraged her to gain better control of diet and exercise prior to this visit. Due to busy schedule and infertility treatments patient has suffered some depression and has allowed her diabetes care to deteriorate. Despite this, she has not had any weight gain. A1c will be tested at follow-up with Dr. Talmage Nap.   Plan: 1) Attempt to resume more healthy diet and limit fast food 2) Increase exercise, walk during t-ball practice or invest in a basketball goal 3) Continue testing regularly, depending on A1c control 4) Return for follow-up in 3 months on Tuesday July 1st at 2:00 pm

## 2012-11-29 ENCOUNTER — Telehealth: Payer: Self-pay | Admitting: Vascular Surgery

## 2012-11-29 NOTE — Telephone Encounter (Signed)
Received referral for ABI's from Dr. Selena Batten [phone].  Voicemail 04/09 @10 :21am, 04/10 @3 :44pm, 04/15 @10 :08am to schedule.   Referral Coordinator, Pat aware and will advise Dr. Selena Batten.

## 2012-11-30 ENCOUNTER — Encounter (INDEPENDENT_AMBULATORY_CARE_PROVIDER_SITE_OTHER): Payer: 59 | Admitting: Vascular Surgery

## 2012-11-30 DIAGNOSIS — M79609 Pain in unspecified limb: Secondary | ICD-10-CM

## 2012-11-30 NOTE — Progress Notes (Signed)
Patient ID: Alexis Campos, female   DOB: 02-14-1966, 47 y.o.   MRN: [id number] ATTENDING PHYSICIAN NOTE: I have reviewed the chart and agree with the plan as detailed above. Denny Levy MD Pager [phone]

## 2013-02-03 ENCOUNTER — Other Ambulatory Visit: Payer: Self-pay | Admitting: Obstetrics and Gynecology

## 2013-02-04 ENCOUNTER — Inpatient Hospital Stay (HOSPITAL_COMMUNITY)
Admission: AD | Admit: 2013-02-04 | Discharge: 2013-02-04 | Disposition: A | Discharge status: Home / Self Care | Payer: 59 | Source: Ambulatory Visit | Attending: Obstetrics and Gynecology | Admitting: Obstetrics and Gynecology

## 2013-02-04 DIAGNOSIS — R109 Unspecified abdominal pain: Secondary | ICD-10-CM | POA: Insufficient documentation

## 2013-02-04 DIAGNOSIS — E119 Type 2 diabetes mellitus without complications: Secondary | ICD-10-CM | POA: Insufficient documentation

## 2013-02-04 LAB — ESTRADIOL: Estradiol: 37.5 pg/mL

## 2013-02-14 ENCOUNTER — Ambulatory Visit (INDEPENDENT_AMBULATORY_CARE_PROVIDER_SITE_OTHER): Payer: 59 | Admitting: Family Medicine

## 2013-02-14 VITALS — BP 124/76 | Wt 220.0 lb

## 2013-02-14 DIAGNOSIS — E119 Type 2 diabetes mellitus without complications: Secondary | ICD-10-CM

## 2013-02-15 NOTE — Progress Notes (Signed)
Patient presents today for 3 month diabetes follow-up as part of the employer-sponsored Link to Wellness program. Current diabetes regimen includes Lantus, Novolog, and Metformin. Patient also continues on daily ASA. Most recent MD follow-up was this past spring, but patient is due for follow-up soon. Of note, patient is now undergoing a round of fertility treatments and is being followed closely during this therapy. In addition patient is taking Magnesium daily for leg cramps and reports relief.   Diabetes Assessment: Type of Diabetes: Type 2; MD managing Diabetes Dr. Talmage Nap; uses glucometer; takes medications as prescribed; checks feet daily; takes an aspirin a day; checks blood glucose 2-6 times a week; Sees Diabetes provider 3 times per year; hypoglycemia frequency infrequent; A1c 5.4 via MD office in April 2014. Other Diabetes History: Current med regimen includes Lantus 40-50 units daily (patient has required higher doses, 50 units, recently), Novolog sliding scale up to 30 units with each meal, and Metformin 1000 mg twice daily. Patient does maintain good medication compliance. Patient did not bring meter today but is currently testing 3-4 times per week. She is currently undergoing fertility treatments and if she is able to become pregnant, she is aware that she will need to test multiple times per day. Therefore, she is attempting to test daily at this time. Glucose monitoring occurs fasting and when symptomatic. Hypoglycemia is infrequent at this time, and has occurred only 2-3 times over past 1-2 months. Patient does demonstrate appropriate correction of hypoglycemia. Patient denies signs and symptoms of neuropathy including numbness/tingling/burning and symptoms of foot infection. Patient is not due for yearly eye exam.  Lifestyle Factors: Diet - Patient reports some stress eating and bloating. This is largley due to the stress of undergoing fertility treatments. Patient is not very interested in  setting any specific goals at this time but we have discussed returning to a more healthy diet.  Exercise - No routine exercise at this time. Patient continues to stay active with her son, Weston Brass, but this is irregular. Still has [employer] but not using it at this time. I am hopeful patient will strongly consider starting a regular exercise regimen.   Assessment: Patient presents today with at-goal A1c and is doing well with glucose control, but has required an increase in insulin recently. She also has been resisitant to lifestyle changes and continues to need improvement in both diet and exercise. Patient is under a great deal of stress at this time and I am hopeful she will gain motivation soon. She will follow-up with MD soon and will return for LTW follow-up in 3 months.  Plan: 1) Continue to work toward a healthier diet, limiting fast food and carbs 2) Attempt to exercise at least 1-2 times per week 3) Continue testing, with goal of testing daily 4) Follow-up in 3 months on Tuesday October 7th @ 2:00 pm

## 2013-05-08 NOTE — Progress Notes (Signed)
Patient ID: Alexis Campos, female   DOB: 08/07/1966, 47 y.o.   MRN: [id number] ATTENDING PHYSICIAN NOTE: I have reviewed the chart and agree with the plan as detailed above. Denny Levy MD Pager [phone]

## 2013-06-13 ENCOUNTER — Ambulatory Visit (INDEPENDENT_AMBULATORY_CARE_PROVIDER_SITE_OTHER): Payer: Self-pay | Admitting: Family Medicine

## 2013-06-13 VITALS — BP 112/70 | Wt 220.0 lb

## 2013-06-13 DIAGNOSIS — E119 Type 2 diabetes mellitus without complications: Secondary | ICD-10-CM

## 2013-06-13 NOTE — Progress Notes (Signed)
Patient presents today for 3 month diabetes follow-up as part of the employer-sponsored Link to Wellness program. Current diabetes regimen includes Metformin, Lantus, and Humalog. Patient also continues on daily ASA. Most recent MD follow-up was last week with Dr. Talmage Nap. Patient has a pending appt for January for repeat labwork. No med changes at this time other than recent antibiotic courses. Patient has been struggling with upper respiratory and flu-like symptoms for some time. Due to the fatigue and joint aches, MD is screening for lupus, but thus far labwork has been negative.   Diabetes Assessment: Type of Diabetes: Type 2; MD managing Diabetes Dr. Talmage Nap; uses glucometer; takes medications as prescribed; checks feet daily; takes an aspirin a day; checks blood glucose 2-6 times a week; Sees Diabetes provider 3 times per year; hypoglycemia frequency infrequent Other Diabetes History: Current med regimen includes Lantus 50 units daily (patient has required higher doses, 50 units, recently), Novolog sliding scale up to 30 units with each meal, and Metformin 1000 mg twice daily. Patient does maintain good medication compliance. Patient did not bring meter today but is currently testing 2-3 times per week. She will consider testing more frequently, especially if A1c continues to trend upward. Hypoglycemia is infrequent at this time, and has occurred only 2-3 times over past 1-2 months. Patient does demonstrate appropriate correction of hypoglycemia. Patient denies signs and symptoms of neuropathy including numbness/tingling/burning and symptoms of foot infection. Patient is not due for yearly eye exam. A1c of 5.8 on 05/30/13 (prev 5.2 on 06/29/12).  Lifestyle Factors: Diet - Diet has been somewhat irregular due to upper respiratory illnesses. Patient is aware that she needs to work to improve diet and resume a more balanced and regular diet. A1c is creeping toward 6.0 and this concerns patient. She will  attempt to improve diet, but is not interested in any specific goals at this time.  Exercise - No routine exercise at this time. Patient continues to stay active with her son, Weston Brass, but this is irregular. Has cancelled [employer] I am hopeful patient will strongly consider starting a regular exercise regimen.  Assessment: Patient presents today with at-goal A1c and is doing well with glucose control, however, A1c has increased from 5.2 to 5.4 and now to 5.8. Patient is becoming concerned about the trend upward, and will hopefully make appropriate changes to diet to get this back under control. She also has been resisitant to lifestyle changes and continues to need improvement in both diet and exercise. She will follow-up with MD soon and will return for LTW follow-up in 3 months.  Plan: 1) Continue to make healthy dietary choices and attempt to regain a more normal and balanced diet 2) Attempt to exercise as energy level allows 3) Consider increased testing if A1c continues trending upward 4) Follow-up in 3 months on Wednesday Jan 28th @ 8:30 am

## 2013-10-31 ENCOUNTER — Ambulatory Visit (INDEPENDENT_AMBULATORY_CARE_PROVIDER_SITE_OTHER): Payer: Self-pay | Admitting: Family Medicine

## 2013-10-31 VITALS — BP 122/72 | Wt 220.0 lb

## 2013-10-31 DIAGNOSIS — E119 Type 2 diabetes mellitus without complications: Secondary | ICD-10-CM

## 2013-10-31 NOTE — Progress Notes (Signed)
Patient presents today for 3 month diabetes follow-up as part of the employer-sponsored Link to Wellness program. Current diabetes regimen includes Metformin, Novolog, and Lantus. Patient also continues on daily ASA. ACEi and statin have been avoied at this time due to child-bearing potential. Most recent MD follow-up was Feb 2015. Patient has a pending appt for April with Dr. Talmage NapBalan and for June with Dr. Selena BattenKim. Routine labwork was done with Dr. Selena BattenKim in February. Patient was started on fluoxetine at most recent visit and is tolerating well. No other med changes at this time.   Diabetes Assessment: Type of Diabetes: Type 2; MD managing Diabetes Dr. Talmage NapBalan; takes medications as prescribed; checks feet daily; takes an aspirin a day; Sees Diabetes provider 3 times per year; hypoglycemia frequency infrequent; checks blood glucose Never Testing only 2-3 times per week.; does not use glucometer; A1c 5.4 (Feb 2015) Other Diabetes History: Current med regimen includes Lantus 50 units daily (patient has required higher doses, 50 units, recently), Novolog sliding scale up to 30 units with each meal, and Metformin 1000 mg twice daily. Patient does maintain good medication compliance. Patient did not bring meter today but is not currently testing. Hypoglycemia is infrequent at this time, and has occurred only 2-3 times over past 3 months. Patient does demonstrate appropriate correction of hypoglycemia. Patient denies signs and symptoms of neuropathy including numbness/tingling/burning and symptoms of foot infection. Patient is due for yearly eye exam and has an appt scheduled for end of March. Dental exam scheduled for April. A1c of 5.4 on 09/28/13.  Lifestyle Factors: Diet - Diet continues to be irregular an less than optimal. This is largely due to hectic schedule and stressful family circumstances. Patient is aware of the need to improve diet but finds this to be a challenge. She eats out often or on the go due to family  responsibilities and finds that this is an area in need of improvement.  Exercise - No routine exercise at this time. I am hopeful patient will strongly consider starting a regular exercise regimen. I believe this would help improve cholesterol panel, glucose, and help resolve depression in this patient.   Assessment: Patient presents today with at-goal A1c of 5.4 and is doing well with glucose control, however, there remains much room for improvement in both diet and exercise. Patient has not put effort into diet and exercise recently due to stress and busy schedule. She will follow-up with MD soon and will return for LTW follow-up in 3 months.  Plan: 1) Attempt to resume exercise 2) Access [email or web address] to log exercises and earn badges 3) Continue to make healthy dietary choices 4) Follow-up in 3 months on Tues June 23rd @ 2:30 pm

## 2014-02-13 ENCOUNTER — Ambulatory Visit (INDEPENDENT_AMBULATORY_CARE_PROVIDER_SITE_OTHER): Payer: Self-pay | Admitting: Family Medicine

## 2014-02-13 VITALS — BP 118/72 | Wt 216.0 lb

## 2014-02-13 DIAGNOSIS — E119 Type 2 diabetes mellitus without complications: Secondary | ICD-10-CM

## 2014-02-13 NOTE — Progress Notes (Signed)
Patient presents today for 3 month diabetes follow-up as part of the employer-sponsored [employer] to Wellness program. Current diabetes regimen includes Metformin, Lantus, and Novolog. Patient also continues on daily ASA and statin. Most recent MD follow-up was May with PCP for PNA. Patient had labwork with Dr. Talmage NapBalan in June, but will not follow-up for visit until October. No major health changes at this time. Labwork did reveal low Vit D, low iron, and low Mg. Patient is already taking prescription strength Vit D. Today she has purchased Fe supplement and Mg supplement.  Diabetes Assessment: Type of Diabetes: Type 2; MD managing Diabetes Dr. Talmage NapBalan; takes medications as prescribed; checks feet daily; takes an aspirin a day; Sees Diabetes provider 3 times per year; hypoglycemia frequency infrequent; checks blood glucose Never Testing only 1-2 times per week.; does not use glucometer; A1c 5.1 (June 2015) Other Diabetes History: Testing fasting 1-2 times per week, running <130 per pt report. Only a few hypo, symptomatic, and treats. Current med regimen includes Lantus 55 units daily, Novolog sliding scale up to 30 units with each meal, and Metformin 1000 mg twice daily. Patient does maintain good medication compliance. Patient did not bring meter today and is testing 1-2 times weekly, fasting. Hypoglycemia is infrequent at this time, and has occurred only 2-3 times over past 3 months. Patient does demonstrate appropriate correction of hypoglycemia. Patient denies signs and symptoms of neuropathy including numbness/tingling/burning and symptoms of foot infection. Patient is up to date on eye exam and had an appt March 2015. Dental exam was completed in April. A1c had decreased from 5.4 to 5.1 as of 02/01/14.  Lifestyle Factors: Diet - Diet continues to be irregular and less than optimal. This is largely due to hectic schedule. Patient is aware of the need to improve diet but finds this to be a challenge. She eats out  often or on the go due to family responsibilities and finds that this is an area in need of improvement. Patient will see nutritionist, Wyona AlmasJeannie Sykes, July 13th. Exercise - No routine exercise at this time. I am hopeful patient will strongly consider starting a regular exercise regimen. I believe this would help improve cholesterol panel and overall physical and mental health.  Assessment: Patient presents today with at-goal and further improved A1c of 5.1 and is doing well with glucose control, however, there remains much room for improvement in both diet and exercise. Patient has not put effort into diet and exercise recently due to stress and busy schedule. She will attempt to make positive changes to exercise and will follow-up in 3 months.  Plan: 1) Continue making healthy dietary choices 2) Attempt to increase exercise 3) Continue to maintain medication compliance 4) Follow-up in 3 months on Wednesday Sept 30th @ 2:00 pm

## 2014-02-26 ENCOUNTER — Ambulatory Visit (INDEPENDENT_AMBULATORY_CARE_PROVIDER_SITE_OTHER): Payer: 59 | Admitting: Family Medicine

## 2014-02-26 ENCOUNTER — Encounter: Payer: Self-pay | Admitting: Family Medicine

## 2014-02-26 VITALS — Ht 70.0 in | Wt 226.2 lb

## 2014-02-26 DIAGNOSIS — E669 Obesity, unspecified: Secondary | ICD-10-CM

## 2014-02-26 NOTE — Patient Instructions (Signed)
-   -   Make sure Dr. Selena BattenKim re-checks your vitamin D levels.  Please forward all lab results to Dr. Gerilyn PilgrimSykes.   - Try to consume lean red meat such as beef tenderloin at least 3 X wk.  This may be a small amount (i.e., 2 oz in a stir-fry), but any red meat you have will increase the absorption of your iron supplement as well as any plant sources of iron you consume.   - Iron sources:  See handouts.   - Your key to success will be PLANNING AHEAD for meals and snacks.  For example, keep frozen veg's on hand at all times.   - Make three lists of vegetables: (1) those you like and eat now; (2) vegetables you won't even consider; and (3) vegetables you might consider trying if they are prepared a certain way.  Continue to eat veg's you currently eat, but from this last list, choose a vegetable to try at least 3 times a week.  Use small amounts of this vegetable, cut small, combined with foods or seasonings you like.  Try roasting some veg's.    - You can lose weight without eating a lot of veg's, BUT it will be harder, and it will especially be harder to keep the weight off.  TASTE PREFERENCES ARE LEARNED.  This means that it will get easier to choose foods you know are good for you if you are exposed to them enough.    Diet Recommendations for Diabetes   Starchy (carb) foods include: Bread, rice, pasta, potatoes, corn, crackers, bagels, muffins, all baked goods.  (Fruits, milk, and yogurt also have carbohydrate, but most of these foods will not spike your blood sugar as the starchy foods will.)  A few fruits do cause high blood sugars; use small portions of bananas (limit to 1/2 at a time), grapes, and most tropical fruits.    Protein foods include: Meat, fish, poultry, eggs, dairy foods, and beans such as pinto and kidney beans (beans also provide carbohydrate).   1. Eat at least 3 meals and 1-2 snacks per day. Never go more than 4-5 hours while awake without eating.  2. Limit starchy foods to TWO per meal and  ONE per snack. ONE portion of a starchy  food is equal to the following:   - ONE slice of bread (or its equivalent, such as half of a hamburger bun).   - 1/2 cup of a "scoopable" starchy food such as potatoes or rice.   - 15 grams of carbohydrate as shown on food label.  3. Include at every meal: a protein food, a carb food, and vegetables and/or fruit.   - Obtain twice as many veg's as protein or carbohydrate foods for both lunch and dinner.   - Fresh or frozen veg's are best.   - Try to keep frozen veg's on hand for a quick vegetable serving.      - [employer] Gly[employer] End-products.  Found in cola drinks, and have been shown to increase inflammation, risk of cancer; especially concerning if blood glucose levels are poorly controlled.

## 2014-02-26 NOTE — Progress Notes (Signed)
Medical Nutrition Therapy:  Appt start time: 1130 end time:  1230.  Assessment:  Primary concerns today: Weight management.  Selena BattenKim would like to work on her weight and she is interested in continuing to keep her blood sugars in good control (last A1C was 5.1).  She was recently diagnosed with low Vit D level (not sure of #), and with low Hgb last year.  She recently restarted Fe supplements after starting to notice hair thinning and increased fatigue again.    Usual eating pattern includes 2 meals and 2 snacks per day (more often at work).  Selena BattenKim is the single mom of a 5-YO son, and she also cares for her elderly mom.  Selena BattenKim works 7 P to 7 A Fri, Sat, and Sun at [employer].  Usual physical activity includes none.  Currently taking 60 U Lantus nightly and 3 or 4 up to 35 Novolog at meals.  Checks FBG only 1-2 X wk; recently running 90-140, with the high ones following a work day.  When not working, Selena BattenKim tries to sleep on a more normal sleep schedule.    Frequent foods include 33 oz diet Dr. Reino KentPepper, water, convenience foods.  Avoided foods include most veg's (just b/c of preparation).  Does not like to cook.    24-hr recall: Off work ~8:30 AM;  B (10 AM)-   1 breaded chx patty on hamburger bun, 3 [employer] mini choc-covered donuts, diet Dr. Reino KentPepper Slept 10:30 - 4:30 PM Snk ( AM)-    L ( PM)-   Snk ( PM)-   To work at 7 PM D (9 PM)-  [employer] chsburger, small fries, smart water, diet Dr. Reino KentPepper Snk (1 AM)-  [employer] Asian chx salad, 2 small cups ranch dressing, Snickers Bar Yesterday's intake was probably more food than usual for a work day.  Seldom packs meals for work.    Progress Towards Goal(s):  In progress.   Nutritional Diagnosis:  NB-2.1 Physical inactivity As related to poor motivation and fatigue.  As evidenced by no regular exercise.    Intervention:  Nutrition education.  Monitoring/Evaluation:  Dietary intake, exercise, and body weight in 4 week(s).

## 2014-03-26 ENCOUNTER — Encounter: Payer: Self-pay | Admitting: Family Medicine

## 2014-03-26 ENCOUNTER — Ambulatory Visit (INDEPENDENT_AMBULATORY_CARE_PROVIDER_SITE_OTHER): Payer: 59 | Admitting: Family Medicine

## 2014-03-26 VITALS — Ht 70.0 in | Wt 223.2 lb

## 2014-03-26 DIAGNOSIS — E119 Type 2 diabetes mellitus without complications: Secondary | ICD-10-CM

## 2014-03-26 DIAGNOSIS — E669 Obesity, unspecified: Secondary | ICD-10-CM

## 2014-03-26 NOTE — Patient Instructions (Addendum)
-   Your vitamin D is now at a good level.  I recommend you finish your 50,000 IU prescription, then supplement at 1000 IU per day.   - Get a pedometer, and track your steps daily, both when you work and when you're off.    - When [name] is at flag football, walk.  - Fruit:  Aim for fresh as often as possible for taste, nutrition, and best BG control.    - Remember that bananas, grapes, and watermelon are high-glycemic, so limit portion size.    - If you eat fruit along with other non-carb foods such as protein and fat sources (and fiber), there will be less impact on your BG.     - Good additions to fruit as a snack:  Unsalted nuts/seeds, cheese (up to 1 oz), meat.   - The best type of fat for cholesterol control (& overall health) is XV OLIVE OIL.  A second choice may be canola oil.  (Try mixing the two, and increase proportion of olive oil over time.)  - If you use margarine, always choose trans fat-free.   - Make a list of 7-10 meals that taste good, are relatively quick and easy to prepare, and that meet your nutritional needs.  Use this as a basis for shopping, so you can make one of these meals any time.  Bring your list to your follow-up appointment for review.    - Suggestions:  Try yellow squash and/or in place of lasagne.  Also try spaghetti squash: Cut in half, discard seeds; roast or steam cut-side down in baking pan with water; scrape squash out with a     fork.   - Decrease diet drinks; increase water.    - FOOD GOALS:  - LIMIT STARCHY FOODS TO TWO PER MEAL AND ONE PER SNACK.    - Daily food record.    - Get at least 32 oz of plain water per day.    - Email [email or web address] your progress on above goals no later than Aug 31.  (Feel free to email any other time.)

## 2014-03-26 NOTE — Progress Notes (Signed)
Patient ID: Alexis Campos, female   DOB: 02/25/1966, 48 y.o.   MRN: [id number] Reviewed: Agree with our pharmacologist's documentation and management.  

## 2014-03-26 NOTE — Progress Notes (Signed)
Patient ID: Alexis Campos, female   DOB: Jun 17, 1966, 48 y.o.   MRN: [id number] Reviewed: Agree with our pharmacologist's documentation and management.

## 2014-03-26 NOTE — Progress Notes (Signed)
Medical Nutrition Therapy:  Appt start time: 0900 end time:  1000.  Assessment:  Primary concerns today: Weight management.  Alexis Campos has been trying to increase vegetables, usually getting a lot of veg's at one meal a day.  She has been playing basketball with her 5-YO son 15-60 min 3-4 times a week.  Still struggling with portion control as well having breakfast after working third shift.   Has decreased diet soda to 1 per week; consuming Crystal Light ~6 X day.   Has not checked BG in a couple weeks; FBG was ~122 a couple wks ago.  Yesterday was first hypoglycemic symptoms since last nutrition appt.   Consistently working Fri, Sat, and Sun third shifts.   Alexis Campos hopes to take classes to earn her BSN starting this fall, which will mean more time constraints for her.     24-hr recall:  (Worked all night Sat-Sun; got up at 5 PM Sunday) B (8 PM)-  1 handful of grapes, Crystal Light  Snk (10 PM)-  2 c veg soup L (1 AM)-  6" grilled chx sub w/ Amer chs, cuc, tom, pep, mayo, Crystal Light Snk (5 AM)-  1/2 c fruit cup of peaches (felt hypoglycemic) D ( PM)-   Snk ( PM)-   No plain water yesterday.    Progress Towards Goal(s):  In progress.   Nutritional Diagnosis:  Some progress noted on NB-2.1 Physical inactivity As related to poor motivation and fatigue.  As evidenced by 15-60 min basketball play 3-4 X wk.    Intervention:  Nutrition education.  Monitoring/Evaluation:  Dietary intake, exercise, and body weight in 6 week(s).

## 2014-03-26 NOTE — Progress Notes (Signed)
Patient ID: Alexis Campos, female   DOB: 03/08/1966, 48 y.o.   MRN: [id number] Reviewed: Agree with our pharmacologist's documentation and management.  

## 2014-05-01 ENCOUNTER — Other Ambulatory Visit (HOSPITAL_COMMUNITY): Payer: Self-pay | Admitting: Internal Medicine

## 2014-05-01 DIAGNOSIS — R1031 Right lower quadrant pain: Secondary | ICD-10-CM

## 2014-05-03 ENCOUNTER — Ambulatory Visit (HOSPITAL_COMMUNITY)
Admission: RE | Admit: 2014-05-03 | Discharge: 2014-05-03 | Disposition: A | Discharge status: Home / Self Care | Payer: 59 | Source: Ambulatory Visit | Attending: Internal Medicine | Admitting: Internal Medicine

## 2014-05-03 DIAGNOSIS — R1031 Right lower quadrant pain: Secondary | ICD-10-CM | POA: Diagnosis not present

## 2014-05-07 ENCOUNTER — Ambulatory Visit: Payer: 59 | Admitting: Family Medicine

## 2014-05-14 ENCOUNTER — Telehealth: Payer: Self-pay | Admitting: Hematology and Oncology

## 2014-05-14 NOTE — Telephone Encounter (Signed)
S/W PATIENT AND GAVE NP APPT FOR 10/06 @ 1:30 W/DR. GORSUCH

## 2014-05-22 ENCOUNTER — Encounter: Payer: Self-pay | Admitting: Hematology and Oncology

## 2014-05-22 ENCOUNTER — Telehealth: Payer: Self-pay | Admitting: Hematology and Oncology

## 2014-05-22 ENCOUNTER — Ambulatory Visit: Payer: 59

## 2014-05-22 ENCOUNTER — Ambulatory Visit (HOSPITAL_BASED_OUTPATIENT_CLINIC_OR_DEPARTMENT_OTHER): Payer: 59

## 2014-05-22 ENCOUNTER — Ambulatory Visit (HOSPITAL_BASED_OUTPATIENT_CLINIC_OR_DEPARTMENT_OTHER): Payer: 59 | Admitting: Hematology and Oncology

## 2014-05-22 VITALS — BP 129/59 | HR 73 | Temp 97.7°F | Resp 19 | Ht 70.0 in | Wt 222.1 lb

## 2014-05-22 DIAGNOSIS — D508 Other iron deficiency anemias: Secondary | ICD-10-CM

## 2014-05-22 DIAGNOSIS — D649 Anemia, unspecified: Secondary | ICD-10-CM

## 2014-05-22 DIAGNOSIS — R161 Splenomegaly, not elsewhere classified: Secondary | ICD-10-CM

## 2014-05-22 DIAGNOSIS — K760 Fatty (change of) liver, not elsewhere classified: Secondary | ICD-10-CM

## 2014-05-22 HISTORY — DX: Splenomegaly, not elsewhere classified: R16.1

## 2014-05-22 HISTORY — DX: Anemia, unspecified: D64.9

## 2014-05-22 LAB — CBC & DIFF AND RETIC
BASO%: 0.2 % (ref 0.0–2.0)
Basophils Absolute: 0 10*3/uL (ref 0.0–0.1)
EOS ABS: 0.1 10*3/uL (ref 0.0–0.5)
EOS%: 2.2 % (ref 0.0–7.0)
HCT: 33 % — ABNORMAL LOW (ref 34.8–46.6)
HGB: 10.9 g/dL — ABNORMAL LOW (ref 11.6–15.9)
Immature Retic Fract: 12.9 % — ABNORMAL HIGH (ref 1.60–10.00)
LYMPH%: 27.7 % (ref 14.0–49.7)
MCH: 30.6 pg (ref 25.1–34.0)
MCHC: 33 g/dL (ref 31.5–36.0)
MCV: 92.7 fL (ref 79.5–101.0)
MONO#: 0.3 10*3/uL (ref 0.1–0.9)
MONO%: 5.5 % (ref 0.0–14.0)
NEUT%: 64.4 % (ref 38.4–76.8)
NEUTROS ABS: 3.5 10*3/uL (ref 1.5–6.5)
PLATELETS: 198 10*3/uL (ref 145–400)
RBC: 3.56 10*6/uL — AB (ref 3.70–5.45)
RDW: 14.1 % (ref 11.2–14.5)
RETIC %: 2.86 % — AB (ref 0.70–2.10)
Retic Ct Abs: 101.82 10*3/uL — ABNORMAL HIGH (ref 33.70–90.70)
WBC: 5.5 10*3/uL (ref 3.9–10.3)
lymph#: 1.5 10*3/uL (ref 0.9–3.3)

## 2014-05-22 LAB — HEPATIC FUNCTION PANEL
ALBUMIN: 3.9 g/dL (ref 3.5–5.2)
ALT: 84 U/L — AB (ref 0–35)
AST: 19 U/L (ref 0–37)
Alkaline Phosphatase: 85 U/L (ref 39–117)
BILIRUBIN DIRECT: 0.1 mg/dL (ref 0.0–0.3)
Indirect Bilirubin: 0.3 mg/dL (ref 0.2–1.2)
TOTAL PROTEIN: 6.7 g/dL (ref 6.0–8.3)
Total Bilirubin: 0.4 mg/dL (ref 0.2–1.2)

## 2014-05-22 LAB — IRON AND TIBC CHCC
%SAT: 12 % — AB (ref 21–57)
Iron: 44 ug/dL (ref 41–142)
TIBC: 370 ug/dL (ref 236–444)
UIBC: 326 ug/dL (ref 120–384)

## 2014-05-22 LAB — LACTATE DEHYDROGENASE (CC13): LDH: 153 U/L (ref 125–245)

## 2014-05-22 LAB — FERRITIN CHCC: FERRITIN: 25 ng/mL (ref 9–269)

## 2014-05-22 NOTE — Assessment & Plan Note (Signed)
This is likely related to the fatty liver disease. I reassured the patient.

## 2014-05-22 NOTE — Telephone Encounter (Signed)
gv and printed appt sched and avs for pt for OCT.....sent pt to lab °

## 2014-05-22 NOTE — Progress Notes (Signed)
[facility] CONSULT NOTE  Patient Care Team: Pearson GrippeJames Kim, MD as PCP - General (Internal Medicine) Graylin ShiverSalem F Ganem, MD as Consulting Physician (Gastroenterology) Dorisann FramesBindubal Balan, MD as Consulting Physician (Endocrinology) Serita KyleSheronette A Cousins, MD as Consulting Physician (Obstetrics and Gynecology) Linna DarnerJean C Sykes, RD as Dietitian (Family Medicine) Artis DelayNi Alhassan Everingham, MD as Consulting Physician (Hematology and Oncology)  CHIEF COMPLAINTS/PURPOSE OF CONSULTATION:  Anemia and splenomegaly  HISTORY OF PRESENTING ILLNESS:  Alexis Campos 48 y.o. female is here because of incidental finding of splenomegaly. This patient had background history of liver cirrhosis, biopsy proven in the year of 2000. She had poorly controlled diabetes, chronic anemia, as well as chronic diarrhea. Recently, she complained of severe right upper quadrant pain with associated abdominal cramps. She was known to have history of kidney stones. She had ultrasound of her abdomen on 05/03/2014 which showed incidental splenomegaly. From the anemia standpoint, she has been taking oral iron supplements twice a day for the last few months. The patient had history of menorrhagia related to the severe endometriosis. She complained of mild fatigue. The patient denies any recent signs or symptoms of bleeding such as spontaneous epistaxis, hematuria or hematochezia.   MEDICAL HISTORY:  Past Medical History  Diagnosis Date  . Blood transfusion   . Neuromuscular disorder     fibromyalgia  . GERD (gastroesophageal reflux disease)   . Hypertension   . Hyperlipidemia   . Endometriosis   . Fibromyalgia   . Sinusitis   . Diabetes mellitus     diagnosed 1996-insulin and metformin  . History of blood transfusion 12/09    [facility]  . Seasonal allergies     recent bronchitis-chest xray/ct scan-granuloma noted present since 2008  . Anemia   . Splenomegaly 05/22/2014  . Anemia 05/22/2014    SURGICAL HISTORY: Past Surgical History   Procedure Laterality Date  . Tonsillectomy  09/1994  . Lysis of adhesions  1996/ 2002  . Cesarean section    . Appendectomy  05/1995  . Abdominal surgery      LOA for endomeriosis  . Cholecystectomy  01/1995  . Colonoscopy      SOCIAL HISTORY: History   Social History  . Marital Status: Single    Spouse Name: N/A    Number of Children: N/A  . Years of Education: N/A   Occupational History  . Not on file.   Social History Main Topics  . Smoking status: Never Smoker   . Smokeless tobacco: Never Used  . Alcohol Use: No  . Drug Use: No  . Sexual Activity: Not on file   Other Topics Concern  . Not on file   Social History Narrative  . No narrative on file    FAMILY HISTORY: Family History  Problem Relation Age of Onset  . Anemia Mother     ALLERGIES:  is allergic to codeine and metoclopramide hcl.  MEDICATIONS:  Current Outpatient Prescriptions  Medication Sig Dispense Refill  . albuterol (PROVENTIL HFA;VENTOLIN HFA) 108 (90 BASE) MCG/ACT inhaler Inhale 2 puffs into the lungs every 4 (four) hours as needed for wheezing or shortness of breath.      . diphenhydramine-acetaminophen (TYLENOL PM) 25-500 MG TABS Take 1 tablet by mouth at bedtime.      . ergocalciferol (VITAMIN D2) 50000 UNITS capsule Take 50,000 Units by mouth once a week. On Wednesdays.      Marland Kitchen. esomeprazole (NEXIUM) 40 MG capsule Take 40 mg by mouth 2 (two) times daily.      .Marland Kitchen  ferrous gluconate (FERGON) 324 MG tablet Take 324 mg by mouth 2 (two) times daily.       . folic acid (FOLVITE) 1 MG tablet Take 1 mg by mouth daily.      . furosemide (LASIX) 20 MG tablet Take 20 mg by mouth daily as needed. For fluid retention during menstrual cycle      . Insulin Aspart (NOVOLOG [place]) Inject 2-25 Units into the skin 3 (three) times daily. Sliding scale insulin, depending on blood sugar reading & carbs eaten, and with snacks      . insulin glargine (LANTUS) 100 UNIT/ML injection Inject 40 Units into the skin at  bedtime.      . magnesium oxide (MAG-OX) 400 MG tablet Take 400 mg by mouth daily.      . metFORMIN (GLUCOPHAGE) 1000 MG tablet Take 1,000 mg by mouth 2 (two) times daily with a meal.      . metoprolol (LOPRESSOR) 100 MG tablet Take 150 mg by mouth 2 (two) times daily.      . ondansetron (ZOFRAN) 4 MG tablet Take 4 mg by mouth every 8 (eight) hours as needed for nausea or vomiting.      . Prenatal Vit-Fe Fumarate-FA (PRENATAL MULTIVITAMIN) TABS Take 1 tablet by mouth daily.       No current facility-administered medications for this visit.    REVIEW OF SYSTEMS:   Constitutional: Denies fevers, chills or abnormal night sweats Eyes: Denies blurriness of vision, double vision or watery eyes Ears, nose, mouth, throat, and face: Denies mucositis or sore throat Respiratory: Denies cough, dyspnea or wheezes Cardiovascular: Denies palpitation, chest discomfort or lower extremity swelling Gastrointestinal:  Denies nausea, heartburn or change in bowel habits Skin: Denies abnormal skin rashes Lymphatics: Denies new lymphadenopathy or easy bruising Neurological:Denies numbness, tingling or new weaknesses Behavioral/Psych: Mood is stable, no new changes  All other systems were reviewed with the patient and are negative.  PHYSICAL EXAMINATION: ECOG PERFORMANCE STATUS: 0 - Asymptomatic  Filed Vitals:   05/22/14 1311  BP: 129/59  Pulse: 73  Temp: 97.7 F (36.5 C)  Resp: 19   Filed Weights   05/22/14 1311  Weight: 222 lb 1.6 oz (100.744 kg)    GENERAL:alert, no distress and comfortable. She is morbidly obese SKIN: skin color, texture, turgor are normal, no rashes or significant lesions EYES: normal, conjunctiva are pink and non-injected, sclera clear OROPHARYNX:no exudate, no erythema and lips, buccal mucosa, and tongue normal  NECK: supple, thyroid normal size, non-tender, without nodularity LYMPH:  no palpable lymphadenopathy in the cervical, axillary or inguinal LUNGS: clear to  auscultation and percussion with normal breathing effort HEART: regular rate & rhythm and no murmurs and no lower extremity edema ABDOMEN:abdomen soft, non-tender and normal bowel sounds Musculoskeletal:no cyanosis of digits and no clubbing  PSYCH: alert & oriented x 3 with fluent speech NEURO: no focal motor/sensory deficits  LABORATORY DATA:  I have reviewed the data as listed Lab Results  Component Value Date   WBC 5.5 05/22/2014   HGB 10.9* 05/22/2014   HCT 33.0* 05/22/2014   MCV 92.7 05/22/2014   PLT 198 05/22/2014    Recent Labs  05/22/14 1417  PROT 6.7  ALBUMIN 3.9  AST 19  ALT 84*  ALKPHOS 85  BILITOT 0.4  BILIDIR 0.1  IBILI 0.3    RADIOGRAPHIC STUDIES: I have personally reviewed the radiological images as listed and agreed with the findings in the report. US Abdomen Complete  05/03/2014   CLINICAL DATA:  Abdominal pain  EXAM: ULTRASOUND ABDOMEN COMPLETE  COMPARISON:  April 26, 2011  FINDINGS: Gallbladder:  Surgically absent.  Common bile duct:  Diameter: 12 mm proximally, tapering to 7 mm distally. No mass or calculus seen.  Liver:  No focal lesion identified.  Liver echogenicity is increased.  IVC:  No abnormality visualized.  Pancreas:  No mass or inflammatory focus.  Spleen:  Enlarged, measuring 14.8 x 5.8 x 16.0 cm with a measured volume of 711 cubic cm. No focal splenic lesion. No perisplenic fluid.  Right Kidney:  Length: 10.6 cm. Echogenicity within normal limits. No mass or hydronephrosis visualized. Nonshadowing echogenic foci in the kidney probably represent focal arcuate arteries, a normal finding.  Left Kidney:  Length: 10.9 cm. Echogenicity within normal limits. No mass or hydronephrosis visualized.  Abdominal aorta:  No aneurysm visualized.  Other findings:  No demonstrable ascites.  IMPRESSION: Splenomegaly. Increased liver echogenicity may represent hepatic steatosis or underlying parenchymal disease. Entities may exist concurrently. While no focal liver  lesions are identified, it must be cautioned that the sensitivity of ultrasound for focal liver lesions is diminished in this circumstance.  Gallbladder absent. Stable prominence of the common bile duct with tapering distally. No biliary duct mass or calculus appreciable.   Electronically Signed   By: Bretta Bang M.D.   On: 05/03/2014 09:19    ASSESSMENT & PLAN:  Splenomegaly This is likely related to the fatty liver disease. I reassured the patient.  Anemia I suspect this is anemia of chronic disease. I will additional workup of this. Currently, she is taking iron supplements.    Orders Placed This Encounter  Procedures  . CBC & Diff and Retic    Standing Status: Future     Number of Occurrences: 1     Standing Expiration Date: 06/26/2015  . Iron and TIBC    Standing Status: Future     Number of Occurrences: 1     Standing Expiration Date: 06/26/2015  . Ferritin    Standing Status: Future     Number of Occurrences: 1     Standing Expiration Date: 06/26/2015  . Haptoglobin    Standing Status: Future     Number of Occurrences: 1     Standing Expiration Date: 06/26/2015  . Lactate dehydrogenase    Standing Status: Future     Number of Occurrences: 1     Standing Expiration Date: 06/26/2015  . Hepatic function panel    Standing Status: Future     Number of Occurrences: 1     Standing Expiration Date: 05/22/2015    All questions were answered. The patient knows to call the clinic with any problems, questions or concerns. I spent 30 minutes counseling the patient face to face. The total time spent in the appointment was 55 minutes and more than 50% was on counseling.     [facility], Willo Yoon, MD 05/22/2014 8:41 PM

## 2014-05-22 NOTE — Assessment & Plan Note (Signed)
I suspect this is anemia of chronic disease. I will additional workup of this. Currently, she is taking iron supplements.

## 2014-05-23 LAB — HAPTOGLOBIN: HAPTOGLOBIN: 141 mg/dL (ref 45–215)

## 2014-05-24 ENCOUNTER — Telehealth: Payer: Self-pay | Admitting: Hematology and Oncology

## 2014-05-24 ENCOUNTER — Other Ambulatory Visit: Payer: Self-pay | Admitting: Hematology and Oncology

## 2014-05-24 ENCOUNTER — Telehealth: Payer: Self-pay | Admitting: *Deleted

## 2014-05-24 DIAGNOSIS — D509 Iron deficiency anemia, unspecified: Secondary | ICD-10-CM

## 2014-05-24 NOTE — Telephone Encounter (Signed)
I reviewed her blood test results over the phone. Splenomegaly is definitively related to fatty liver disease. Liver function tests were abnormal. She has mild iron deficiency anemia and has poor tolerance of oral iron. The most likely cause of her anemia is due to chronic blood loss/malabsorption syndrome. We discussed some of the risks, benefits, and alternatives of intravenous iron infusions. The patient is symptomatic from anemia and the iron level is critically low. She tolerated oral iron supplement poorly and desires to achieved higher levels of iron faster for adequate hematopoesis. Some of the side-effects to be expected including risks of infusion reactions, phlebitis, headaches, nausea and fatigue.  The patient is willing to proceed. Patient education material was dispensed.  Goal is to keep ferritin level greater than 50

## 2014-05-24 NOTE — Telephone Encounter (Signed)
Per staff message and POF I have scheduled appts. Patient aware of appts. JMW

## 2014-05-24 NOTE — Telephone Encounter (Signed)
Per 10/08 POF, lab for 11/16, sent msg to add IVF on 10/14 and to let pt know about her lab apt also..... KJ

## 2014-05-30 ENCOUNTER — Ambulatory Visit (HOSPITAL_BASED_OUTPATIENT_CLINIC_OR_DEPARTMENT_OTHER): Payer: 59

## 2014-05-30 VITALS — BP 129/51 | HR 71 | Temp 98.5°F | Resp 20

## 2014-05-30 DIAGNOSIS — D508 Other iron deficiency anemias: Secondary | ICD-10-CM

## 2014-05-30 DIAGNOSIS — D509 Iron deficiency anemia, unspecified: Secondary | ICD-10-CM

## 2014-05-30 DIAGNOSIS — K909 Intestinal malabsorption, unspecified: Secondary | ICD-10-CM

## 2014-05-30 MED ORDER — SODIUM CHLORIDE 0.9 % IV SOLN
Freq: Once | INTRAVENOUS | Status: AC
  Administered 2014-05-30: 09:00:00 via INTRAVENOUS

## 2014-05-30 MED ORDER — SODIUM CHLORIDE 0.9 % IV SOLN
1020.0000 mg | Freq: Once | INTRAVENOUS | Status: AC
  Administered 2014-05-30: 1020 mg via INTRAVENOUS
  Filled 2014-05-30: qty 34

## 2014-05-30 NOTE — Progress Notes (Signed)
Pt tolerated first time feraheme treatment without difficulty.  Monitored for 30 minutes. Vital signs stable. Ambulatory in no distress at discharge.

## 2014-05-30 NOTE — Patient Instructions (Signed)

## 2014-06-13 ENCOUNTER — Ambulatory Visit (INDEPENDENT_AMBULATORY_CARE_PROVIDER_SITE_OTHER): Payer: Self-pay | Admitting: Family Medicine

## 2014-06-13 VITALS — BP 118/78 | Wt 224.0 lb

## 2014-06-13 DIAGNOSIS — E119 Type 2 diabetes mellitus without complications: Secondary | ICD-10-CM

## 2014-06-18 ENCOUNTER — Encounter: Payer: Self-pay | Admitting: Hematology and Oncology

## 2014-06-19 NOTE — Progress Notes (Signed)
Patient presents today for 3 month diabetes follow-up as part of the employer-sponsored Link to Wellness program. Current diabetes regimen includes Metformin, Lantus, and Novolog. Patient also continues on daily ASA and statin. Most recent MD follow-up was October with endo. She will follow-up again in 6 months. No major health changes at this time. However, patient continues to struggle with correcting lab abnormalities. She remains on prescription vitamin D supplementation and magnesium supplement. She is also being followed by a hematologist for iron-deficiency anemia and is receiving iron infusions. Patient is having difficulty replacing magnesium levels and is now taking magnesium oxide 800 mg twice daily. Long term PPI use can contribute to hypomagnesaemia and we have discussed discontinuing Nexium in order to help improve magnesium levels. I will contact MD regarding this. A1c at recent endo visit was 5.8.  Diabetes Assessment: Type of Diabetes: Type 2; MD managing Diabetes Dr. Chalmers Cater; takes medications as prescribed; checks feet daily; takes an aspirin a day; Sees Diabetes provider 3 times per year; hypoglycemia frequency infrequent; checks blood glucose less than 1 time a week Testing only 2-3 times per week.; uses glucometer Infrequently; Other Diabetes History: Current med regimen includes Lantus 55-60 units daily, Novolog sliding scale up to 30 units with each meal, and Metformin 1000 mg twice daily. Patient does maintain good medication compliance. Patient did not bring meter today and is testing 1-2 times weekly when working, fasting, and up to twice daily when off work. Per patient report, glucose ranges 80-140. Hypoglycemia is infrequent at this time, patient did have one hypoglycemic episode of 38 which was corrected quickly and resulted from an over correction of hyperglycemic episode. Patient does demonstrate appropriate correction of hypoglycemia. Patient denies signs and symptoms of neuropathy  including numbness/tingling/burning and symptoms of foot infection. Patient is up to date on eye exam and had an appt March 2015. Dental exam was completed in April. A1c had increased to 5.8 from 5.2 as of Oct 2015.  Lifestyle Factors: Diet - Pt has struggled recently with nausea and has had weight fluctuations as a result. Diet continues to be irregular and less than optimal. This is also due to busy schedule. Patient is aware of the need to improve diet but finds this to be a challenge. She eats out often or on the go due to family responsibilities. Patient met with nutritionist, Iver Nestle, on two occasions and felt these visits were helpful. Pt admits that she has not put into action any of the recommendations from [place].  Exercise - No routine exercise at this time. Patient has struggled with health recently and has been fatigued due to IDA (currently receiving iron infusions). I am hopeful patient will strongly consider starting a regular exercise regimen. I believe this would help improve cholesterol panel and overall physical and mental health.  Plan: 1) Continue making healthy dietary choices and reschedule with Jeannie when ready 2) Attempt to increase exercise 3) Continue to maintain medication compliance 4) Follow-up in 3 months on Tues Feb 2nd @ 3:30 pm

## 2014-06-27 ENCOUNTER — Other Ambulatory Visit: Payer: Self-pay | Admitting: Hematology and Oncology

## 2014-07-02 ENCOUNTER — Telehealth: Payer: Self-pay | Admitting: *Deleted

## 2014-07-02 ENCOUNTER — Other Ambulatory Visit (HOSPITAL_BASED_OUTPATIENT_CLINIC_OR_DEPARTMENT_OTHER): Payer: 59

## 2014-07-02 DIAGNOSIS — D509 Iron deficiency anemia, unspecified: Secondary | ICD-10-CM

## 2014-07-02 DIAGNOSIS — D649 Anemia, unspecified: Secondary | ICD-10-CM

## 2014-07-02 LAB — CBC & DIFF AND RETIC
BASO%: 0.3 % (ref 0.0–2.0)
Basophils Absolute: 0 10*3/uL (ref 0.0–0.1)
EOS ABS: 0.1 10*3/uL (ref 0.0–0.5)
EOS%: 1.6 % (ref 0.0–7.0)
HCT: 35.1 % (ref 34.8–46.6)
HGB: 11.7 g/dL (ref 11.6–15.9)
Immature Retic Fract: 17.1 % — ABNORMAL HIGH (ref 1.60–10.00)
LYMPH#: 1.9 10*3/uL (ref 0.9–3.3)
LYMPH%: 28 % (ref 14.0–49.7)
MCH: 31.6 pg (ref 25.1–34.0)
MCHC: 33.3 g/dL (ref 31.5–36.0)
MCV: 94.9 fL (ref 79.5–101.0)
MONO#: 0.4 10*3/uL (ref 0.1–0.9)
MONO%: 5.8 % (ref 0.0–14.0)
NEUT%: 64.3 % (ref 38.4–76.8)
NEUTROS ABS: 4.3 10*3/uL (ref 1.5–6.5)
Platelets: 258 10*3/uL (ref 145–400)
RBC: 3.7 10*6/uL (ref 3.70–5.45)
RDW: 15.5 % — AB (ref 11.2–14.5)
RETIC %: 3.06 % — AB (ref 0.70–2.10)
RETIC CT ABS: 113.22 10*3/uL — AB (ref 33.70–90.70)
WBC: 6.7 10*3/uL (ref 3.9–10.3)

## 2014-07-02 LAB — FERRITIN CHCC: Ferritin: 332 ng/ml — ABNORMAL HIGH (ref 9–269)

## 2014-07-02 LAB — IRON AND TIBC CHCC
%SAT: 25 % (ref 21–57)
IRON: 73 ug/dL (ref 41–142)
TIBC: 294 ug/dL (ref 236–444)
UIBC: 221 ug/dL (ref 120–384)

## 2014-07-02 NOTE — Telephone Encounter (Signed)
Pt notified of results below 

## 2014-07-02 NOTE — Telephone Encounter (Signed)
-----   Message from Artis DelayNi Gorsuch, MD sent at 07/02/2014  1:00 PM EST ----- Regarding: labs Pls let her know anemia has resolved. No need to return ----- Message -----    From: [facility] Interface    Sent: 07/02/2014  11:41 AM      To: Artis DelayNi Gorsuch, MD

## 2014-07-03 NOTE — Progress Notes (Signed)
Patient ID: Alexis BradyKimberly A Rider, female   DOB: 30-Sep-1965, 48 y.o.   MRN: [id number] Reviewed: Agree with the documentation and management of our [facility].

## 2014-09-18 ENCOUNTER — Ambulatory Visit (INDEPENDENT_AMBULATORY_CARE_PROVIDER_SITE_OTHER): Payer: Self-pay | Admitting: Family Medicine

## 2014-09-18 VITALS — BP 122/78 | Wt 208.0 lb

## 2014-09-18 DIAGNOSIS — E119 Type 2 diabetes mellitus without complications: Secondary | ICD-10-CM

## 2014-09-19 NOTE — Progress Notes (Signed)
Patient presents today for 3 month diabetes follow-up as part of the employer-sponsored Link to Wellness program. Current diabetes regimen includes Metformin, Lantus, and Novolog. Patient is no longer on a statin due to attempts at pregnancy. Most recent MD follow-up was October with endo. She will follow-up again in 6 months in April. No major health changes at this time. She remains on prescription vitamin D supplementation and magnesium supplement. She is due for follow-up labwork to assess magnsium, and iron. Although we discussed at last visit, pt remains on esomeprazole (PPIs can contribute to low magnesium) but will d/c and start famotidine instead. Most recent A1c by MD office was 5.8.  Diabetes Assessment: Type of Diabetes: Type 2; MD managing Diabetes Dr. Talmage NapBalan; takes medications as prescribed; checks feet daily; takes an aspirin a day; Sees Diabetes provider 3 times per year; uses glucometer Infrequently; checks blood glucose less than 1 time a week Testing only 2-3 times per week.; hypoglycemia frequency frequent; Other Diabetes History: Current med regimen includes Lantus 40-50 units daily, Novolog sliding scale up to 12 units with each meal, and Metformin 1000 mg twice daily. Patient does maintain good medication compliance. Patient did not bring meter today and is testing very infrequently at this time. Patient does not know how her glucose has been running but does know that she has required less insulin lately and is also experiencing more hypoglycemia than she has in the past. She reports early morning (4-5 am) hypo as low as 30s. She corrects with grapes or wt watchers brownies. She will continue to monitor for this and adjust insulin as needed. Patient denies signs and symptoms of neuropathy including numbness/tingling/burning and symptoms of foot infection. Patient is due for eye exam and dental exam and will schedule soon. A1c had increased to 5.8 from 5.2 as of Oct 2015.  Lifestyle   Factors:

## 2014-10-18 ENCOUNTER — Other Ambulatory Visit: Payer: Self-pay | Admitting: Internal Medicine

## 2014-10-18 DIAGNOSIS — R7989 Other specified abnormal findings of blood chemistry: Secondary | ICD-10-CM

## 2014-10-18 NOTE — Progress Notes (Signed)
Patient ID: Alexis Campos, female   DOB: 11/20/1965, 48 y.o.   MRN: [id number] Reviewed: Agree with the documentation and management of our [place] Pharmacologist.   

## 2014-11-02 ENCOUNTER — Ambulatory Visit (HOSPITAL_COMMUNITY)
Admission: RE | Admit: 2014-11-02 | Discharge: 2014-11-02 | Disposition: A | Discharge status: Home / Self Care | Payer: 59 | Source: Ambulatory Visit | Attending: Internal Medicine | Admitting: Internal Medicine

## 2014-11-02 DIAGNOSIS — Z87442 Personal history of urinary calculi: Secondary | ICD-10-CM | POA: Diagnosis not present

## 2014-11-02 DIAGNOSIS — I10 Essential (primary) hypertension: Secondary | ICD-10-CM | POA: Insufficient documentation

## 2014-11-02 DIAGNOSIS — E119 Type 2 diabetes mellitus without complications: Secondary | ICD-10-CM | POA: Insufficient documentation

## 2014-11-02 DIAGNOSIS — R7989 Other specified abnormal findings of blood chemistry: Secondary | ICD-10-CM

## 2014-12-27 ENCOUNTER — Ambulatory Visit: Payer: 59 | Admitting: Pharmacist

## 2015-01-22 ENCOUNTER — Encounter: Payer: Self-pay | Admitting: Pharmacist

## 2015-02-13 ENCOUNTER — Ambulatory Visit: Payer: Self-pay | Admitting: Pharmacist

## 2015-02-14 ENCOUNTER — Encounter: Payer: Self-pay | Admitting: Pharmacist

## 2015-02-14 ENCOUNTER — Ambulatory Visit: Payer: Self-pay | Admitting: Pharmacist

## 2015-02-14 ENCOUNTER — Ambulatory Visit (INDEPENDENT_AMBULATORY_CARE_PROVIDER_SITE_OTHER): Payer: Self-pay | Admitting: Family Medicine

## 2015-02-14 VITALS — BP 98/62 | Ht 70.0 in | Wt 198.0 lb

## 2015-02-14 DIAGNOSIS — E119 Type 2 diabetes mellitus without complications: Secondary | ICD-10-CM

## 2015-02-14 NOTE — Progress Notes (Signed)
Subjective:  Patient is a 49 yo female with type 2 diabetes who presents today for 3 month follow-up as part of the employer-sponsored Link to Wellness program. Current diabetes regimen includes Metformin, Lantus, and Novolog. Patient is not on daily ASA, ACEi, and statin at this time.  BP is controled by beta blocker, ASA is not indicated, and cholesterol is under good control and pt has fatty liver disease.  Most recent MD follow-up was March with Dr. Talmage NapBalan. Patient has a pending appt for October 2016.  No med changes or major health changes at this time. Of note, patient discontinued esomeprazole due to learning of potential kidney failure after long term use and also due to hypomagnesemia potentially caused by PPIs.  Patient reports symptoms are under good control especially after recent weight loss.   Diabetes Assessment:  No changes to diabetes regimen. Novolog remains at sliding scale and lantus dose varies between 40-60, averaging 45-50 units daily.  Patient does maintain good medication compliance. Most recent A1c was 5.4% which is at goal of less than 7%.  Weight has decreased by 10 lb since last visit and has decreased by >20 lb over past year.  Patient did bring meter today and is currently testing only on occasion as as needed for hypoglycemia symtpoms.   Hypoglycemia was occassional but no episodes in the past month. Patient does demonstrate appropriate correction of hypoglycemia.  Patient denies signs and symptoms of neuropathy including numbness/tingling/burning and symptoms of foot infection.  Patient is not up to date on eye exam and is due for one any time, most recent exam April 2015, no issues at this visit.       Lifestyle Assessment:  Exericse - working with weight trainer twice weekly for 30 minutes, swimming/water aerobics twice weekly wants to increase to three times weekly.  Goal of 10,000 steps per day.  Diet - getting back on track with three meals per day, better choices,  less fried foods, etc.  Increasing fruits and vegetable.  Will resume dietary journaling as she feels this has helped in the past.    Plan and Goals: 1)  Great job with exercise adherence.  Attempt to achieve 10,000 steps/day.  Goal of increasing water aerobics three times per week 2)  Attempt to regain more structured diet and try to keep a dietary journal  3)  Increase glucose testing to at least 2-3 times per week 4)  Follow-up with Link to Wellness in 3 months on Wed Sept 28th @ 1:30 pm  Alexis Campos, PharmD [facility]  [phone]

## 2015-02-14 NOTE — Patient Instructions (Addendum)
1)  Great job with exercise adherence.  Attempt to achieve 10,000 steps/day.  Goal of increasing water aerobics three times per week 2)  Attempt to regain more structured diet and try to keep a dietary journal  3)  Increase glucose testing to at least 2-3 times per week 4)  Follow-up with Link to Wellness in 3 months on Wed Sept 28th @ 1:30 pm  Great to see you today!

## 2015-02-19 NOTE — Progress Notes (Signed)
Patient ID: Alexis Campos, female   DOB: [birth year], 49 y.o.   MRN: [id number] ATTENDING PHYSICIAN NOTE: I have reviewed the chart and agree with the plan as detailed above. Denny LevySara Seith Aikey MD Pager [phone]

## 2015-05-15 ENCOUNTER — Ambulatory Visit: Payer: 59 | Admitting: Pharmacist

## 2015-05-15 ENCOUNTER — Ambulatory Visit: Payer: 59 | Admitting: Family Medicine

## 2015-09-16 MED FILL — NOVOLOG FLEXPEN SYRINGE: 100 | 90 days supply | Qty: 60 | Fill #0

## 2015-09-16 MED FILL — METOPROLOL TARTRATE 50 MG T: 50 | 90 days supply | Qty: 540 | Fill #0

## 2015-09-16 MED FILL — metFORMIN HCL 500 MG TABS: 500 | 90 days supply | Qty: 360 | Fill #0

## 2015-09-16 MED FILL — LANTUS SOLOSTAR 100 UNITS/M: 100 | 85 days supply | Qty: 60 | Fill #0

## 2015-09-19 DIAGNOSIS — M7061 Trochanteric bursitis, right hip: Secondary | ICD-10-CM | POA: Diagnosis not present

## 2015-09-20 DIAGNOSIS — M797 Fibromyalgia: Secondary | ICD-10-CM | POA: Diagnosis not present

## 2015-09-20 DIAGNOSIS — M25551 Pain in right hip: Secondary | ICD-10-CM | POA: Diagnosis not present

## 2015-09-20 DIAGNOSIS — M25561 Pain in right knee: Secondary | ICD-10-CM | POA: Diagnosis not present

## 2015-09-20 DIAGNOSIS — M25552 Pain in left hip: Secondary | ICD-10-CM | POA: Diagnosis not present

## 2015-09-20 DIAGNOSIS — M25559 Pain in unspecified hip: Secondary | ICD-10-CM | POA: Diagnosis not present

## 2015-09-20 DIAGNOSIS — M25562 Pain in left knee: Secondary | ICD-10-CM | POA: Diagnosis not present

## 2015-09-20 DIAGNOSIS — M25569 Pain in unspecified knee: Secondary | ICD-10-CM | POA: Diagnosis not present

## 2015-09-20 DIAGNOSIS — M7060 Trochanteric bursitis, unspecified hip: Secondary | ICD-10-CM | POA: Diagnosis not present

## 2015-09-24 DIAGNOSIS — I83812 Varicose veins of left lower extremities with pain: Secondary | ICD-10-CM | POA: Diagnosis not present

## 2015-09-24 DIAGNOSIS — I8312 Varicose veins of left lower extremity with inflammation: Secondary | ICD-10-CM | POA: Diagnosis not present

## 2015-09-25 DIAGNOSIS — I83812 Varicose veins of left lower extremities with pain: Secondary | ICD-10-CM | POA: Diagnosis not present

## 2015-09-25 DIAGNOSIS — I8312 Varicose veins of left lower extremity with inflammation: Secondary | ICD-10-CM | POA: Diagnosis not present

## 2015-09-26 DIAGNOSIS — E7800 Pure hypercholesterolemia, unspecified: Secondary | ICD-10-CM | POA: Diagnosis not present

## 2015-09-26 DIAGNOSIS — I10 Essential (primary) hypertension: Secondary | ICD-10-CM | POA: Diagnosis not present

## 2015-09-26 DIAGNOSIS — E119 Type 2 diabetes mellitus without complications: Secondary | ICD-10-CM | POA: Diagnosis not present

## 2015-09-26 DIAGNOSIS — E559 Vitamin D deficiency, unspecified: Secondary | ICD-10-CM | POA: Diagnosis not present

## 2015-10-07 DIAGNOSIS — M25569 Pain in unspecified knee: Secondary | ICD-10-CM | POA: Diagnosis not present

## 2015-10-07 DIAGNOSIS — E559 Vitamin D deficiency, unspecified: Secondary | ICD-10-CM | POA: Diagnosis not present

## 2015-10-07 DIAGNOSIS — E7800 Pure hypercholesterolemia, unspecified: Secondary | ICD-10-CM | POA: Diagnosis not present

## 2015-10-07 DIAGNOSIS — M797 Fibromyalgia: Secondary | ICD-10-CM | POA: Diagnosis not present

## 2015-10-07 DIAGNOSIS — M7060 Trochanteric bursitis, unspecified hip: Secondary | ICD-10-CM | POA: Diagnosis not present

## 2015-10-07 DIAGNOSIS — M25559 Pain in unspecified hip: Secondary | ICD-10-CM | POA: Diagnosis not present

## 2015-10-07 DIAGNOSIS — E119 Type 2 diabetes mellitus without complications: Secondary | ICD-10-CM | POA: Diagnosis not present

## 2015-10-07 MED FILL — DULoxetine HCL 30 MG CPEP: 30 | 30 days supply | Qty: 60 | Fill #0

## 2015-10-07 MED FILL — DICLOFENAC SODIUM 1% GEL: 1 | 30 days supply | Qty: 100 | Fill #0

## 2015-10-09 DIAGNOSIS — I83812 Varicose veins of left lower extremities with pain: Secondary | ICD-10-CM | POA: Diagnosis not present

## 2015-10-09 DIAGNOSIS — I8312 Varicose veins of left lower extremity with inflammation: Secondary | ICD-10-CM | POA: Diagnosis not present

## 2015-10-09 DIAGNOSIS — I87322 Chronic venous hypertension (idiopathic) with inflammation of left lower extremity: Secondary | ICD-10-CM | POA: Diagnosis not present

## 2015-10-23 DIAGNOSIS — I8312 Varicose veins of left lower extremity with inflammation: Secondary | ICD-10-CM | POA: Diagnosis not present

## 2015-10-23 DIAGNOSIS — I87322 Chronic venous hypertension (idiopathic) with inflammation of left lower extremity: Secondary | ICD-10-CM | POA: Diagnosis not present

## 2015-10-23 DIAGNOSIS — M7981 Nontraumatic hematoma of soft tissue: Secondary | ICD-10-CM | POA: Diagnosis not present

## 2015-10-23 DIAGNOSIS — I83812 Varicose veins of left lower extremities with pain: Secondary | ICD-10-CM | POA: Diagnosis not present

## 2015-10-28 DIAGNOSIS — J329 Chronic sinusitis, unspecified: Secondary | ICD-10-CM | POA: Diagnosis not present

## 2015-10-28 DIAGNOSIS — L719 Rosacea, unspecified: Secondary | ICD-10-CM | POA: Diagnosis not present

## 2015-10-28 MED FILL — HYDROCODONE-CHLORPHENIRAM S: 10-8 | 12 days supply | Qty: 120 | Fill #0

## 2015-10-28 MED FILL — DOXYCYCLINE HYC 100 MG CAP: 100 | 10 days supply | Qty: 20 | Fill #0

## 2015-11-12 MED FILL — DULoxetine HCL 30 MG CPEP: 30 | 30 days supply | Qty: 60 | Fill #1

## 2015-12-11 MED FILL — LANTUS SOLOSTAR 100 UNITS/M: 100 | 85 days supply | Qty: 60 | Fill #1

## 2015-12-11 MED FILL — NOVOLOG FLEXPEN SYRINGE: 100 | 90 days supply | Qty: 60 | Fill #1

## 2015-12-11 MED FILL — metFORMIN HCL 500 MG TABS: 500 | 90 days supply | Qty: 360 | Fill #1

## 2015-12-11 MED FILL — METOPROLOL TARTRATE 50 MG T: 50 | 90 days supply | Qty: 540 | Fill #1

## 2015-12-12 MED FILL — DULoxetine HCL 30 MG CPEP: 30 | 30 days supply | Qty: 60 | Fill #2

## 2015-12-13 MED FILL — VALACYCLOVIR HCL 500 MG TAB: 500 | 1 days supply | Qty: 4 | Fill #0

## 2016-01-16 MED FILL — DULoxetine HCL 30 MG CPEP: 30 | 30 days supply | Qty: 60 | Fill #0

## 2016-02-04 ENCOUNTER — Ambulatory Visit: Payer: Self-pay | Admitting: Pharmacist

## 2016-02-26 DIAGNOSIS — I8311 Varicose veins of right lower extremity with inflammation: Secondary | ICD-10-CM | POA: Diagnosis not present

## 2016-02-26 DIAGNOSIS — I83811 Varicose veins of right lower extremities with pain: Secondary | ICD-10-CM | POA: Diagnosis not present

## 2016-02-28 DIAGNOSIS — I8311 Varicose veins of right lower extremity with inflammation: Secondary | ICD-10-CM | POA: Diagnosis not present

## 2016-03-05 DIAGNOSIS — I83811 Varicose veins of right lower extremities with pain: Secondary | ICD-10-CM | POA: Diagnosis not present

## 2016-03-05 DIAGNOSIS — I8311 Varicose veins of right lower extremity with inflammation: Secondary | ICD-10-CM | POA: Diagnosis not present

## 2016-03-05 DIAGNOSIS — I87321 Chronic venous hypertension (idiopathic) with inflammation of right lower extremity: Secondary | ICD-10-CM | POA: Diagnosis not present

## 2016-03-09 DIAGNOSIS — E119 Type 2 diabetes mellitus without complications: Secondary | ICD-10-CM | POA: Diagnosis not present

## 2016-03-09 DIAGNOSIS — H2513 Age-related nuclear cataract, bilateral: Secondary | ICD-10-CM | POA: Diagnosis not present

## 2016-03-09 DIAGNOSIS — H524 Presbyopia: Secondary | ICD-10-CM | POA: Diagnosis not present

## 2016-03-09 DIAGNOSIS — H43393 Other vitreous opacities, bilateral: Secondary | ICD-10-CM | POA: Diagnosis not present

## 2016-03-09 DIAGNOSIS — H40013 Open angle with borderline findings, low risk, bilateral: Secondary | ICD-10-CM | POA: Diagnosis not present

## 2016-03-11 DIAGNOSIS — M797 Fibromyalgia: Secondary | ICD-10-CM | POA: Diagnosis not present

## 2016-03-11 DIAGNOSIS — M7060 Trochanteric bursitis, unspecified hip: Secondary | ICD-10-CM | POA: Diagnosis not present

## 2016-03-11 DIAGNOSIS — M25559 Pain in unspecified hip: Secondary | ICD-10-CM | POA: Diagnosis not present

## 2016-03-11 DIAGNOSIS — M25511 Pain in right shoulder: Secondary | ICD-10-CM | POA: Diagnosis not present

## 2016-03-11 DIAGNOSIS — M25569 Pain in unspecified knee: Secondary | ICD-10-CM | POA: Diagnosis not present

## 2016-03-11 MED FILL — tiZANidine HCL 2 MG TABS: 2 | 30 days supply | Qty: 60 | Fill #0

## 2016-03-11 MED FILL — DICLOFENAC SODIUM 1% GEL: 1 | 30 days supply | Qty: 100 | Fill #0

## 2016-03-11 MED FILL — DULoxetine HCL 30 MG CPEP: 30 | 30 days supply | Qty: 90 | Fill #0

## 2016-03-24 MED FILL — LANTUS SOLOSTAR 100 UNITS/M: 100 | 85 days supply | Qty: 60 | Fill #2

## 2016-03-24 MED FILL — metFORMIN HCL 500 MG TABS: 500 | 90 days supply | Qty: 360 | Fill #2

## 2016-03-24 MED FILL — METOPROLOL TARTRATE 50 MG T: 50 | 90 days supply | Qty: 540 | Fill #2

## 2016-03-24 MED FILL — NOVOLOG FLEXPEN SYRINGE: 100 | 90 days supply | Qty: 60 | Fill #2

## 2016-03-24 MED FILL — VALACYCLOVIR HCL 500 MG TAB: 500 | 1 days supply | Qty: 4 | Fill #1

## 2016-04-02 DIAGNOSIS — M7981 Nontraumatic hematoma of soft tissue: Secondary | ICD-10-CM | POA: Diagnosis not present

## 2016-04-02 DIAGNOSIS — I8311 Varicose veins of right lower extremity with inflammation: Secondary | ICD-10-CM | POA: Diagnosis not present

## 2016-04-02 DIAGNOSIS — I87321 Chronic venous hypertension (idiopathic) with inflammation of right lower extremity: Secondary | ICD-10-CM | POA: Diagnosis not present

## 2016-04-02 DIAGNOSIS — I83811 Varicose veins of right lower extremities with pain: Secondary | ICD-10-CM | POA: Diagnosis not present

## 2016-04-08 DIAGNOSIS — M25559 Pain in unspecified hip: Secondary | ICD-10-CM | POA: Diagnosis not present

## 2016-04-08 DIAGNOSIS — M797 Fibromyalgia: Secondary | ICD-10-CM | POA: Diagnosis not present

## 2016-04-08 DIAGNOSIS — M47894 Other spondylosis, thoracic region: Secondary | ICD-10-CM | POA: Diagnosis not present

## 2016-04-08 DIAGNOSIS — M25569 Pain in unspecified knee: Secondary | ICD-10-CM | POA: Diagnosis not present

## 2016-04-08 DIAGNOSIS — M7060 Trochanteric bursitis, unspecified hip: Secondary | ICD-10-CM | POA: Diagnosis not present

## 2016-04-21 DIAGNOSIS — I10 Essential (primary) hypertension: Secondary | ICD-10-CM | POA: Diagnosis not present

## 2016-04-21 DIAGNOSIS — E119 Type 2 diabetes mellitus without complications: Secondary | ICD-10-CM | POA: Diagnosis not present

## 2016-04-21 DIAGNOSIS — E7800 Pure hypercholesterolemia, unspecified: Secondary | ICD-10-CM | POA: Diagnosis not present

## 2016-04-21 DIAGNOSIS — E559 Vitamin D deficiency, unspecified: Secondary | ICD-10-CM | POA: Diagnosis not present

## 2016-04-23 ENCOUNTER — Encounter: Payer: Self-pay | Admitting: Pharmacist

## 2016-04-23 ENCOUNTER — Other Ambulatory Visit: Payer: Self-pay | Admitting: Pharmacist

## 2016-04-23 VITALS — BP 110/74 | Wt 210.0 lb

## 2016-04-23 DIAGNOSIS — E118 Type 2 diabetes mellitus with unspecified complications: Secondary | ICD-10-CM

## 2016-04-23 DIAGNOSIS — Z794 Long term (current) use of insulin: Secondary | ICD-10-CM

## 2016-04-23 MED FILL — VIT D2 1.25 MG (50,000 UNIT: 1.25 MG | 84 days supply | Qty: 12 | Fill #0

## 2016-04-23 NOTE — Patient Outreach (Signed)
[facility] Care Management  04/23/2016  Duard BradyKimberly A Campos September 06, 1965 [id number]   Subjective:  Patient is a 50 yo female with type 2 diabetes who presents today for follow-up as part of the employer-sponsored Link to Wellness program. Current diabetes regimen includes Metformin, Lantus, and Novolog. Patient is not on daily ASA, ACEi, and statin at this time.  BP is controled by beta blocker, ASA is not indicated, and cholesterol is under good control and pt has fatty liver disease.  Most recent MD follow-up was Sept 2017 with Dr. Talmage NapBalan. Patient has a pending appt for March 2018.  No major health changes at this time. At recent MD appt, [facility]  Also, patient recently discontinued duloxetine due to adverse effect of tremor.     Diabetes Assessment:  No changes to diabetes regimen. Novolog remains at sliding scale and lantus dose varies between 50-60, averaging 60 units daily.  Patient does maintain good medication compliance, but does intentionally skip novolog if she is going to be active. Most recent A1c was 5.7% (prev 5.4%) which is increased but remains at goal of less than 7%.  Weight has increased by 12 lb since last visit but remains below her highest weight from 2 years ago.  She has set a personal goal to achieve weight below 200 lb.  Patient did not bring meter today and is not currently testing.  MD recommended resuming testing at least once daily, fasting and prior to meals for better novolog dosing.  Hypoglycemia is rare but has occurred 1-2 times over previous month, pt does not test, but corrects with pineapple.  Hypoglycemia usually occurs during the night.  Patient denies signs and symptoms of neuropathy including numbness/tingling/burning and symptoms of foot infection.  Patient is up to date on eye exam and was negative for retinopathy.  Overdue for dental exam.    Lifestyle Assessment:  Exericse - no longer working with weight  trainer.  Continues tracking steps and acheives goal of 10,000 steps daily at least 5 days per week.  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  She uses her Sarina Sergazelle fitness machine several days per week during commercials, etc.  She also plans to resume water aerobics class in October.   Diet - Patient is limiting breads, potatoes, starches.  She does eat out and rarely cooks but orders grilled chicken, vegetables, etc.  Tries to avoid fatty and fried foods.  She does not eat breakfast routinely but when she does it is normally an egg mcmuffin.     Plan and Goals: 1) Continue to exercise, achieving at least 10,000 steps per day.  In addition, resume water aerobics in October.  2)  Attempt to maintain healthy dietary choices 3)  Weight loss goal of at least 10 lb by next visit.  Ultimate goal of maintaining weight below 200 lb 3)  Resume glucose testing to at least once daily per MD recommendation  4)  Follow-up with Link to Wellness in 6 months on Tuesday March 13th @ 11:00 am  Great to see you today!  Alexis Campos, PharmD [facility]  [phone]

## 2016-04-30 DIAGNOSIS — M7981 Nontraumatic hematoma of soft tissue: Secondary | ICD-10-CM | POA: Diagnosis not present

## 2016-04-30 DIAGNOSIS — I8311 Varicose veins of right lower extremity with inflammation: Secondary | ICD-10-CM | POA: Diagnosis not present

## 2016-04-30 DIAGNOSIS — I87321 Chronic venous hypertension (idiopathic) with inflammation of right lower extremity: Secondary | ICD-10-CM | POA: Diagnosis not present

## 2016-04-30 DIAGNOSIS — I83811 Varicose veins of right lower extremities with pain: Secondary | ICD-10-CM | POA: Diagnosis not present

## 2016-05-06 DIAGNOSIS — L0390 Cellulitis, unspecified: Secondary | ICD-10-CM | POA: Diagnosis not present

## 2016-05-06 MED FILL — DOXYCYCLINE HYCLATE 100 MG: 100 | 10 days supply | Qty: 20 | Fill #0

## 2016-05-06 MED FILL — MECLIZINE 25 MG TABLET: 25 | 15 days supply | Qty: 60 | Fill #0

## 2016-05-07 MED FILL — FLUTICASONE PROP 50 MCG SPR: 50 | 30 days supply | Qty: 16 | Fill #0

## 2016-05-14 DIAGNOSIS — I8311 Varicose veins of right lower extremity with inflammation: Secondary | ICD-10-CM | POA: Diagnosis not present

## 2016-05-14 DIAGNOSIS — M7981 Nontraumatic hematoma of soft tissue: Secondary | ICD-10-CM | POA: Diagnosis not present

## 2016-05-14 DIAGNOSIS — I83811 Varicose veins of right lower extremities with pain: Secondary | ICD-10-CM | POA: Diagnosis not present

## 2016-07-02 MED FILL — VIT D2 1.25 MG (50,000 UNIT: 1.25 MG | 27 days supply | Qty: 4 | Fill #1

## 2016-07-02 MED FILL — METOPROLOL TARTRATE 50 MG T: 50 | 90 days supply | Qty: 540 | Fill #0

## 2016-07-02 MED FILL — NOVOLOG FLEXPEN SYRINGE: 100 | 90 days supply | Qty: 60 | Fill #3

## 2016-07-02 MED FILL — metFORMIN HCL 500 MG TABS: 500 | 90 days supply | Qty: 360 | Fill #3

## 2016-07-02 MED FILL — VALACYCLOVIR HCL 500 MG TAB: 500 | 1 days supply | Qty: 4 | Fill #2

## 2016-07-02 MED FILL — LANTUS SOLOSTAR 100 UNITS/M: 100 | 85 days supply | Qty: 60 | Fill #3

## 2016-07-06 MED FILL — UNIFINE PENTIPS 8MM 31G: 31G X 8 MM | 90 days supply | Qty: 500 | Fill #0

## 2016-07-08 DIAGNOSIS — I8312 Varicose veins of left lower extremity with inflammation: Secondary | ICD-10-CM | POA: Diagnosis not present

## 2016-07-23 DIAGNOSIS — I8311 Varicose veins of right lower extremity with inflammation: Secondary | ICD-10-CM | POA: Diagnosis not present

## 2016-08-06 DIAGNOSIS — I8312 Varicose veins of left lower extremity with inflammation: Secondary | ICD-10-CM | POA: Diagnosis not present

## 2016-10-14 MED FILL — metFORMIN HCL 500 MG TABS: 500 | 90 days supply | Qty: 360 | Fill #0

## 2016-10-14 MED FILL — LANTUS SOLOSTAR 100 UNITS/M: 100 | 85 days supply | Qty: 60 | Fill #0

## 2016-10-14 MED FILL — METOPROLOL TARTRATE 50 MG T: 50 | 90 days supply | Qty: 540 | Fill #1

## 2016-10-15 MED FILL — VIT D2 1.25 MG (50,000 UNIT: 1.25 MG | 28 days supply | Qty: 4 | Fill #0

## 2016-10-20 MED FILL — HUMALOG 100 UNITS/ML KWIKPE: 100 | 90 days supply | Qty: 60 | Fill #0

## 2016-10-21 DIAGNOSIS — E559 Vitamin D deficiency, unspecified: Secondary | ICD-10-CM | POA: Diagnosis not present

## 2016-10-21 DIAGNOSIS — E538 Deficiency of other specified B group vitamins: Secondary | ICD-10-CM | POA: Diagnosis not present

## 2016-10-21 DIAGNOSIS — E7800 Pure hypercholesterolemia, unspecified: Secondary | ICD-10-CM | POA: Diagnosis not present

## 2016-10-21 DIAGNOSIS — E119 Type 2 diabetes mellitus without complications: Secondary | ICD-10-CM | POA: Diagnosis not present

## 2016-10-22 DIAGNOSIS — I10 Essential (primary) hypertension: Secondary | ICD-10-CM | POA: Diagnosis not present

## 2016-10-22 DIAGNOSIS — E119 Type 2 diabetes mellitus without complications: Secondary | ICD-10-CM | POA: Diagnosis not present

## 2016-10-22 DIAGNOSIS — E538 Deficiency of other specified B group vitamins: Secondary | ICD-10-CM | POA: Diagnosis not present

## 2016-10-22 DIAGNOSIS — E7800 Pure hypercholesterolemia, unspecified: Secondary | ICD-10-CM | POA: Diagnosis not present

## 2016-10-22 MED FILL — FREESTYLE LITE TEST STRIP: 90 days supply | Qty: 300 | Fill #0

## 2016-10-22 MED FILL — LISINOPRIL 2.5 MG TABLET: 2.5 | 90 days supply | Qty: 90 | Fill #0

## 2016-10-22 MED FILL — FREESTYLE LITE METER: 30 days supply | Qty: 1 | Fill #0

## 2016-10-22 MED FILL — CITALOPRAM HBR 10 MG TABLET: 10 | 90 days supply | Qty: 90 | Fill #0

## 2016-10-23 MED FILL — FREESTYLE LANCETS: 90 days supply | Qty: 300 | Fill #0

## 2016-10-27 ENCOUNTER — Ambulatory Visit: Payer: 59 | Admitting: Pharmacist

## 2016-11-18 DIAGNOSIS — J22 Unspecified acute lower respiratory infection: Secondary | ICD-10-CM | POA: Diagnosis not present

## 2016-11-18 MED FILL — AMOX-CLAV 875-125 MG TABLET: 875-125 | 7 days supply | Qty: 14 | Fill #0

## 2016-11-18 MED FILL — HYDROCODONE-CHLORPHENIRAM S: 10-8 | 10 days supply | Qty: 100 | Fill #0

## 2016-11-19 MED FILL — METOPROLOL TARTRATE 50 MG T: 50 | 30 days supply | Qty: 180 | Fill #2

## 2016-11-24 DIAGNOSIS — R110 Nausea: Secondary | ICD-10-CM | POA: Diagnosis not present

## 2016-11-24 DIAGNOSIS — R5383 Other fatigue: Secondary | ICD-10-CM | POA: Diagnosis not present

## 2016-11-24 DIAGNOSIS — R05 Cough: Secondary | ICD-10-CM | POA: Diagnosis not present

## 2016-11-24 DIAGNOSIS — J40 Bronchitis, not specified as acute or chronic: Secondary | ICD-10-CM | POA: Diagnosis not present

## 2016-12-16 ENCOUNTER — Telehealth: Payer: Self-pay

## 2016-12-16 NOTE — Patient Outreach (Signed)
Called [facility] member to schedule a follow up appointment, there was no answer, left message to call me back. 

## 2016-12-28 MED FILL — METOPROLOL TARTRATE 50 MG T: 50 | 90 days supply | Qty: 540 | Fill #3

## 2017-01-25 ENCOUNTER — Telehealth: Payer: Self-pay

## 2017-01-25 DIAGNOSIS — E119 Type 2 diabetes mellitus without complications: Secondary | ICD-10-CM | POA: Diagnosis not present

## 2017-01-25 DIAGNOSIS — E559 Vitamin D deficiency, unspecified: Secondary | ICD-10-CM | POA: Diagnosis not present

## 2017-01-25 DIAGNOSIS — E7800 Pure hypercholesterolemia, unspecified: Secondary | ICD-10-CM | POA: Diagnosis not present

## 2017-01-25 DIAGNOSIS — R635 Abnormal weight gain: Secondary | ICD-10-CM | POA: Diagnosis not present

## 2017-01-25 DIAGNOSIS — E538 Deficiency of other specified B group vitamins: Secondary | ICD-10-CM | POA: Diagnosis not present

## 2017-01-25 NOTE — Patient Outreach (Signed)
Called [facility] member to schedule a follow up appointment, there was no answer, left message to call me back. 

## 2017-01-27 MED FILL — FREESTYLE LANCETS: 90 days supply | Qty: 300 | Fill #1

## 2017-01-27 MED FILL — metFORMIN HCL 500 MG TABS: 500 | 90 days supply | Qty: 360 | Fill #1

## 2017-01-27 MED FILL — CITALOPRAM HBR 10 MG TABLET: 10 | 90 days supply | Qty: 90 | Fill #1

## 2017-01-27 MED FILL — FREESTYLE LITE TEST STRIP: 90 days supply | Qty: 300 | Fill #1

## 2017-01-27 MED FILL — LISINOPRIL 2.5 MG TABLET: 2.5 | 90 days supply | Qty: 90 | Fill #1

## 2017-01-27 MED FILL — LANTUS SOLOSTAR 100 UNITS/M: 100 | 85 days supply | Qty: 60 | Fill #1

## 2017-01-28 DIAGNOSIS — E119 Type 2 diabetes mellitus without complications: Secondary | ICD-10-CM | POA: Diagnosis not present

## 2017-01-28 DIAGNOSIS — I10 Essential (primary) hypertension: Secondary | ICD-10-CM | POA: Diagnosis not present

## 2017-01-28 DIAGNOSIS — E559 Vitamin D deficiency, unspecified: Secondary | ICD-10-CM | POA: Diagnosis not present

## 2017-01-28 DIAGNOSIS — E7800 Pure hypercholesterolemia, unspecified: Secondary | ICD-10-CM | POA: Diagnosis not present

## 2017-01-28 MED FILL — HUMALOG 100 UNITS/ML KWIKPE: 100 | 90 days supply | Qty: 60 | Fill #0

## 2017-03-16 DIAGNOSIS — H40013 Open angle with borderline findings, low risk, bilateral: Secondary | ICD-10-CM | POA: Diagnosis not present

## 2017-03-16 DIAGNOSIS — H2513 Age-related nuclear cataract, bilateral: Secondary | ICD-10-CM | POA: Diagnosis not present

## 2017-03-16 DIAGNOSIS — E559 Vitamin D deficiency, unspecified: Secondary | ICD-10-CM | POA: Diagnosis not present

## 2017-03-16 DIAGNOSIS — E119 Type 2 diabetes mellitus without complications: Secondary | ICD-10-CM | POA: Diagnosis not present

## 2017-03-16 DIAGNOSIS — H04123 Dry eye syndrome of bilateral lacrimal glands: Secondary | ICD-10-CM | POA: Diagnosis not present

## 2017-03-16 DIAGNOSIS — E538 Deficiency of other specified B group vitamins: Secondary | ICD-10-CM | POA: Diagnosis not present

## 2017-03-16 DIAGNOSIS — H524 Presbyopia: Secondary | ICD-10-CM | POA: Diagnosis not present

## 2017-03-16 MED FILL — DOXYCYCLINE HYCLATE 100 MG: 100 | 90 days supply | Qty: 90 | Fill #0

## 2017-04-06 MED FILL — METOPROLOL TARTRATE 50 MG T: 50 | 90 days supply | Qty: 540 | Fill #4

## 2017-04-14 DIAGNOSIS — R05 Cough: Secondary | ICD-10-CM | POA: Diagnosis not present

## 2017-04-14 DIAGNOSIS — J069 Acute upper respiratory infection, unspecified: Secondary | ICD-10-CM | POA: Diagnosis not present

## 2017-04-14 MED FILL — HYDROCODONE-CHLORPHEN ER SU: 10-8 | 7 days supply | Qty: 70 | Fill #0

## 2017-04-14 MED FILL — AMOX-CLAV 875-125 MG TABLET: 875-125 | 10 days supply | Qty: 20 | Fill #0

## 2017-04-28 MED FILL — LISINOPRIL 2.5 MG TABLET: 2.5 | 90 days supply | Qty: 90 | Fill #2

## 2017-04-28 MED FILL — LANTUS SOLOSTAR 100 UNITS/M: 100 | 85 days supply | Qty: 60 | Fill #2

## 2017-04-28 MED FILL — CITALOPRAM HBR 10 MG TABLET: 10 | 90 days supply | Qty: 90 | Fill #2

## 2017-04-28 MED FILL — metFORMIN HCL 500 MG TABS: 500 | 90 days supply | Qty: 360 | Fill #2

## 2017-07-14 MED FILL — METOPROLOL TARTRATE 50 MG T: 50 | 90 days supply | Qty: 540 | Fill #0

## 2017-07-21 DIAGNOSIS — E538 Deficiency of other specified B group vitamins: Secondary | ICD-10-CM | POA: Diagnosis not present

## 2017-07-21 DIAGNOSIS — E7800 Pure hypercholesterolemia, unspecified: Secondary | ICD-10-CM | POA: Diagnosis not present

## 2017-07-21 DIAGNOSIS — I10 Essential (primary) hypertension: Secondary | ICD-10-CM | POA: Diagnosis not present

## 2017-07-21 DIAGNOSIS — E119 Type 2 diabetes mellitus without complications: Secondary | ICD-10-CM | POA: Diagnosis not present

## 2017-07-21 DIAGNOSIS — E559 Vitamin D deficiency, unspecified: Secondary | ICD-10-CM | POA: Diagnosis not present

## 2017-07-21 DIAGNOSIS — Z0000 Encounter for general adult medical examination without abnormal findings: Secondary | ICD-10-CM | POA: Diagnosis not present

## 2017-08-05 DIAGNOSIS — J019 Acute sinusitis, unspecified: Secondary | ICD-10-CM | POA: Diagnosis not present

## 2017-08-05 DIAGNOSIS — B009 Herpesviral infection, unspecified: Secondary | ICD-10-CM | POA: Diagnosis not present

## 2017-08-05 MED FILL — MUCINEX ER 600 MG TABLET: 600 | 15 days supply | Qty: 60 | Fill #0

## 2017-08-05 MED FILL — AMOX-CLAV 875-125 MG TABLET: 875-125 | 10 days supply | Qty: 20 | Fill #0

## 2017-08-05 MED FILL — VALACYCLOVIR HCL 500 MG TAB: 500 | 2 days supply | Qty: 8 | Fill #0

## 2017-08-26 DIAGNOSIS — E559 Vitamin D deficiency, unspecified: Secondary | ICD-10-CM | POA: Diagnosis not present

## 2017-08-26 DIAGNOSIS — E119 Type 2 diabetes mellitus without complications: Secondary | ICD-10-CM | POA: Diagnosis not present

## 2017-08-26 DIAGNOSIS — F329 Major depressive disorder, single episode, unspecified: Secondary | ICD-10-CM | POA: Diagnosis not present

## 2017-08-26 DIAGNOSIS — E7800 Pure hypercholesterolemia, unspecified: Secondary | ICD-10-CM | POA: Diagnosis not present

## 2017-08-26 DIAGNOSIS — I10 Essential (primary) hypertension: Secondary | ICD-10-CM | POA: Diagnosis not present

## 2017-08-26 MED FILL — LANTUS SOLOSTAR 100 UNITS/M: 100 | 85 days supply | Qty: 60 | Fill #3

## 2017-08-26 MED FILL — metFORMIN HCL 500 MG TABS: 500 | 90 days supply | Qty: 360 | Fill #3

## 2017-08-26 MED FILL — levoFLOXacin 500 MG TABS: 500 | 7 days supply | Qty: 7 | Fill #0

## 2017-08-26 MED FILL — CITALOPRAM HBR 20 MG TABLET: 20 | 90 days supply | Qty: 90 | Fill #0

## 2017-08-26 MED FILL — HUMALOG 100 UNITS/ML KWIKPE: 100 | 83 days supply | Qty: 75 | Fill #0

## 2017-08-26 MED FILL — VIT D2 1.25 MG (50,000 UNIT: 1.25 MG | 84 days supply | Qty: 12 | Fill #0

## 2017-08-26 MED FILL — LISINOPRIL 2.5 MG TABLET: 2.5 | 90 days supply | Qty: 90 | Fill #3

## 2017-09-08 DIAGNOSIS — H9201 Otalgia, right ear: Secondary | ICD-10-CM | POA: Diagnosis not present

## 2017-09-08 DIAGNOSIS — M25511 Pain in right shoulder: Secondary | ICD-10-CM | POA: Diagnosis not present

## 2017-09-08 MED FILL — CEFDINIR 300 MG CAPSULE: 300 | 10 days supply | Qty: 20 | Fill #0

## 2017-09-09 MED FILL — METHOCARBAMOL 500 MG TABS: 500 | 10 days supply | Qty: 40 | Fill #0

## 2017-09-16 MED FILL — FREESTYLE LIBRE 14 DAY READ: 1 days supply | Qty: 1 | Fill #0

## 2017-09-17 MED FILL — FREESTYLE LIBRE 14 DAY SENS: 84 days supply | Qty: 6 | Fill #0

## 2017-10-25 MED FILL — METOPROLOL TARTRATE 50 MG T: 50 | 90 days supply | Qty: 540 | Fill #1

## 2017-11-22 MED FILL — LANTUS SOLOSTAR 100 UNITS/M: 100 | 85 days supply | Qty: 60 | Fill #0

## 2017-11-22 MED FILL — HUMALOG 100 UNITS/ML KWIKPE: 100 | 83 days supply | Qty: 75 | Fill #1

## 2017-11-22 MED FILL — VIT D2 1.25 MG (50,000 UNIT: 1.25 MG | 84 days supply | Qty: 12 | Fill #1

## 2017-11-22 MED FILL — metFORMIN HCL 500 MG TABS: 500 | 90 days supply | Qty: 360 | Fill #0

## 2017-11-22 MED FILL — LISINOPRIL 2.5 MG TABLET: 2.5 | 90 days supply | Qty: 90 | Fill #0

## 2017-11-22 MED FILL — CITALOPRAM HBR 20 MG TABLET: 20 | 90 days supply | Qty: 90 | Fill #1

## 2017-12-01 DIAGNOSIS — R109 Unspecified abdominal pain: Secondary | ICD-10-CM | POA: Diagnosis not present

## 2017-12-01 DIAGNOSIS — J0110 Acute frontal sinusitis, unspecified: Secondary | ICD-10-CM | POA: Diagnosis not present

## 2017-12-01 DIAGNOSIS — E559 Vitamin D deficiency, unspecified: Secondary | ICD-10-CM | POA: Diagnosis not present

## 2017-12-01 DIAGNOSIS — E119 Type 2 diabetes mellitus without complications: Secondary | ICD-10-CM | POA: Diagnosis not present

## 2017-12-01 DIAGNOSIS — F329 Major depressive disorder, single episode, unspecified: Secondary | ICD-10-CM | POA: Diagnosis not present

## 2017-12-01 DIAGNOSIS — I10 Essential (primary) hypertension: Secondary | ICD-10-CM | POA: Diagnosis not present

## 2017-12-01 DIAGNOSIS — J302 Other seasonal allergic rhinitis: Secondary | ICD-10-CM | POA: Diagnosis not present

## 2017-12-01 DIAGNOSIS — M549 Dorsalgia, unspecified: Secondary | ICD-10-CM | POA: Diagnosis not present

## 2017-12-01 DIAGNOSIS — K760 Fatty (change of) liver, not elsewhere classified: Secondary | ICD-10-CM | POA: Diagnosis not present

## 2017-12-01 DIAGNOSIS — E7800 Pure hypercholesterolemia, unspecified: Secondary | ICD-10-CM | POA: Diagnosis not present

## 2017-12-01 MED FILL — CEFUROXIME AXETIL 250 MG TA: 250 | 10 days supply | Qty: 20 | Fill #0

## 2017-12-13 DIAGNOSIS — E559 Vitamin D deficiency, unspecified: Secondary | ICD-10-CM | POA: Diagnosis not present

## 2017-12-13 DIAGNOSIS — E7800 Pure hypercholesterolemia, unspecified: Secondary | ICD-10-CM | POA: Diagnosis not present

## 2017-12-13 DIAGNOSIS — E119 Type 2 diabetes mellitus without complications: Secondary | ICD-10-CM | POA: Diagnosis not present

## 2017-12-13 DIAGNOSIS — E038 Other specified hypothyroidism: Secondary | ICD-10-CM | POA: Diagnosis not present

## 2017-12-13 DIAGNOSIS — D509 Iron deficiency anemia, unspecified: Secondary | ICD-10-CM | POA: Diagnosis not present

## 2017-12-13 DIAGNOSIS — I10 Essential (primary) hypertension: Secondary | ICD-10-CM | POA: Diagnosis not present

## 2017-12-13 MED FILL — LEVOTHYROXINE 50 MCG TABLET: 50 | 30 days supply | Qty: 30 | Fill #0

## 2017-12-15 MED FILL — FREESTYLE LIBRE 14 DAY SENS: 84 days supply | Qty: 6 | Fill #1

## 2018-01-19 MED FILL — LEVOTHYROXINE 50 MCG TABLET: 50 | 30 days supply | Qty: 30 | Fill #1

## 2018-01-19 MED FILL — METOPROLOL TARTRATE 50 MG T: 50 | 90 days supply | Qty: 540 | Fill #2

## 2018-01-20 DIAGNOSIS — E119 Type 2 diabetes mellitus without complications: Secondary | ICD-10-CM | POA: Diagnosis not present

## 2018-01-20 DIAGNOSIS — Z0000 Encounter for general adult medical examination without abnormal findings: Secondary | ICD-10-CM | POA: Diagnosis not present

## 2018-01-20 DIAGNOSIS — E038 Other specified hypothyroidism: Secondary | ICD-10-CM | POA: Diagnosis not present

## 2018-01-25 DIAGNOSIS — M79672 Pain in left foot: Secondary | ICD-10-CM | POA: Diagnosis not present

## 2018-01-25 DIAGNOSIS — Z0000 Encounter for general adult medical examination without abnormal findings: Secondary | ICD-10-CM | POA: Diagnosis not present

## 2018-01-25 DIAGNOSIS — I10 Essential (primary) hypertension: Secondary | ICD-10-CM | POA: Diagnosis not present

## 2018-01-25 DIAGNOSIS — M25572 Pain in left ankle and joints of left foot: Secondary | ICD-10-CM | POA: Diagnosis not present

## 2018-01-25 DIAGNOSIS — E7800 Pure hypercholesterolemia, unspecified: Secondary | ICD-10-CM | POA: Diagnosis not present

## 2018-01-25 DIAGNOSIS — M7989 Other specified soft tissue disorders: Secondary | ICD-10-CM | POA: Diagnosis not present

## 2018-01-25 DIAGNOSIS — R5383 Other fatigue: Secondary | ICD-10-CM | POA: Diagnosis not present

## 2018-01-25 DIAGNOSIS — D509 Iron deficiency anemia, unspecified: Secondary | ICD-10-CM | POA: Diagnosis not present

## 2018-01-25 DIAGNOSIS — R7989 Other specified abnormal findings of blood chemistry: Secondary | ICD-10-CM | POA: Diagnosis not present

## 2018-01-25 DIAGNOSIS — K760 Fatty (change of) liver, not elsewhere classified: Secondary | ICD-10-CM | POA: Diagnosis not present

## 2018-01-25 DIAGNOSIS — E119 Type 2 diabetes mellitus without complications: Secondary | ICD-10-CM | POA: Diagnosis not present

## 2018-01-25 DIAGNOSIS — S99912A Unspecified injury of left ankle, initial encounter: Secondary | ICD-10-CM | POA: Diagnosis not present

## 2018-01-25 MED FILL — MELOXICAM 7.5 MG TABLET: 7.5 | 30 days supply | Qty: 30 | Fill #0

## 2018-01-25 MED FILL — LIDOCAINE 5 % OINT: 5 | 14 days supply | Qty: 35 | Fill #0

## 2018-01-28 DIAGNOSIS — S93409A Sprain of unspecified ligament of unspecified ankle, initial encounter: Secondary | ICD-10-CM | POA: Diagnosis not present

## 2018-01-31 DIAGNOSIS — M25562 Pain in left knee: Secondary | ICD-10-CM | POA: Diagnosis not present

## 2018-01-31 DIAGNOSIS — M25572 Pain in left ankle and joints of left foot: Secondary | ICD-10-CM | POA: Diagnosis not present

## 2018-02-07 DIAGNOSIS — M25572 Pain in left ankle and joints of left foot: Secondary | ICD-10-CM | POA: Diagnosis not present

## 2018-02-07 DIAGNOSIS — E038 Other specified hypothyroidism: Secondary | ICD-10-CM | POA: Diagnosis not present

## 2018-02-15 ENCOUNTER — Other Ambulatory Visit: Payer: Self-pay

## 2018-02-15 ENCOUNTER — Ambulatory Visit: Payer: 59 | Attending: Orthopedic Surgery | Admitting: Physical Therapy

## 2018-02-15 ENCOUNTER — Encounter: Payer: Self-pay | Admitting: Physical Therapy

## 2018-02-15 DIAGNOSIS — R262 Difficulty in walking, not elsewhere classified: Secondary | ICD-10-CM | POA: Diagnosis not present

## 2018-02-15 DIAGNOSIS — M25572 Pain in left ankle and joints of left foot: Secondary | ICD-10-CM | POA: Diagnosis not present

## 2018-02-15 DIAGNOSIS — M6281 Muscle weakness (generalized): Secondary | ICD-10-CM | POA: Insufficient documentation

## 2018-02-15 NOTE — Therapy (Signed)
[name] MAIN [facility] SERVICES [address] [place], [place], [address] Phone: [phone]   Fax:  [phone]  Physical Therapy Evaluation  Patient Details  Name: Alexis Campos MRN: [id number] Date of Birth: 20-Jan-1966 Referring Provider: Frederico Campos   Encounter Date: 02/15/2018  PT End of Session - 02/15/18 1136    Visit Number  1    Number of Visits  17    Date for PT Re-Evaluation  04/12/18    PT Start Time  1115    PT Stop Time  1215    PT Time Calculation (min)  60 min    Equipment Utilized During Treatment  Gait belt    Activity Tolerance  Patient tolerated treatment well    Behavior During Therapy  [facility] for tasks assessed/performed       Past Medical History:  Diagnosis Date  . Anemia   . Anemia 05/22/2014  . Blood transfusion   . Diabetes mellitus    diagnosed 1996-insulin and metformin  . Endometriosis   . Fibromyalgia   . GERD (gastroesophageal reflux disease)   . History of blood transfusion 12/09   [facility]  . Hyperlipidemia   . Hypertension   . Neuromuscular disorder (HCC)    fibromyalgia  . Seasonal allergies    recent bronchitis-chest xray/ct scan-granuloma noted present since 2008  . Sinusitis   . Splenomegaly 05/22/2014    Past Surgical History:  Procedure Laterality Date  . ABDOMINAL SURGERY     LOA for endomeriosis  . APPENDECTOMY  05/1995  . CESAREAN SECTION    . CHOLECYSTECTOMY  01/1995  . COLONOSCOPY    . lysis of adhesions  1996/ 2002  . TONSILLECTOMY  09/1994    There were no vitals filed for this visit.   Subjective Assessment - 02/15/18 1127    Subjective  Patient is having more pain after twisting her left ankle and is wearing a boot.  She is able to bear weight but it is tender.     Patient is accompained by:  Family member    Pertinent History  Patient twised her left ankle june 7th. She was able to work but then it began getting worse. She is wearing a boot on left foot and she  began wearing it on june 17th.  She has been taken out of work until July 9th . 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     How long can you stand comfortably?  with the boot she can stand for 30 mins but it swells    How long can you walk comfortably?  She can walk 10 mins slow and easy with boot    Patient Stated Goals  to get back to work 12 hour shifts and walk required distances    Currently in Pain?  Yes    Pain Score  1     Pain Location  Ankle    Pain Orientation  Left    Pain Descriptors / Indicators  Aching    Pain Onset  More than a month ago    Pain Frequency  Constant    Aggravating Factors   walking    Effect of Pain on Daily Activities  not able to walk very far without her left boot         [facility] PT Assessment - 02/15/18 1131      Assessment   Medical Diagnosis  ankle sprain    Referring Provider  Alexis Campos    Onset Date/Surgical Date  01/21/18    Hand Dominance  Left    Next MD Visit  02/22/18    Prior Therapy  no      Precautions   Precautions  None using left boot    Required Braces or Orthoses  -- left boot      Restrictions   Weight Bearing Restrictions  No      Balance Screen   Has the patient fallen in the past 6 months  No    Has the patient had a decrease in activity level because of a fear of falling?   Yes    Is the patient reluctant to leave their home because of a fear of falling?   No      Home Environment   Living Environment  Private residence    Available Help at Discharge  Family    Type of Home  House    Home Access  Stairs to enter    Entrance Stairs-Number of Steps  3    Entrance Stairs-Rails  None    Home Layout  One level    Home Equipment  None      Prior Function   Level of Independence  Independent with basic ADLs;Independent with household mobility without device;Independent    Vocation  Full time employment taken out of work until july 9 th    Vocation Requirements  standing for 12 hours    Leisure   walking, movies, swimming          PAIN: Patient has tenderness medial and lateral left ankle and pain ranges from 0- 5 left ankle with intermittent pain  Right knee intermittent  pain 6 /10 secondary to walking on the left boot   POSTURE: WNL   PROM/AROM:left ankle DF 5 deg AROM PF 20 deg , inversion 10 deg, eversion 0 deg   STRENGTH:  Graded on a 0-5 scale Muscle Group Left Right                          Hip Flex 5/5 5/5  Hip Abd 5/5 5/5  Hip Add 5/5 5/5  Hip Ext 5/5 5/5  Hip IR/ER 5/5 5/5  Knee Flex 5/5 5/5  Knee Ext 5/5 5/5  Ankle DF 2/5 5/5  Ankle PF 2/5 5/5   SENSATION: WFL L ankle  FUNCTIONAL MOBILITY: Independent transfers and   BALANCE: Impaired due to left boot and decreased WB L foot tolerance when not wearing left boot  GAIT: Patient ambulates with left boot without AD and decreased gait speed .75 m/sec for intermediate distances   OUTCOME MEASURES: TEST Outcome Interpretation  5 times sit<>stand 17.36 sec >60 yo, >15 sec indicates increased risk for falls  10 meter walk test    .75             m/s <1.0 m/s indicates increased risk for falls; limited community ambulator      6 minute walk test      510          Feet 1000 feet is community ambulator            Treatment: Heel cord stretch 30 sec x 3 with sheet   YTB PF x 15  AROM DR/ PF x 10   Patient has pain with all exercises that diminished after exercise stopped  and was educated with HEP       Objective measurements completed  on examination: See above findings.              PT Education - 02/15/18 1135    Education Details  plan of care    Person(s) Educated  Patient    Methods  Explanation    Comprehension  Verbalized understanding       PT Short Term Goals - 02/15/18 1350      PT SHORT TERM GOAL #1   Title  Patient will be independent in home exercise program to improve strength/mobility for better functional independence with ADLs.    Time  4    Period   Weeks    Status  New    Target Date  03/15/18      PT SHORT TERM GOAL #2   Title   Patient (< 79 years old) will complete five times sit to stand test in < 10 seconds indicating an increased LE strength and improved balance.    Time  4    Period  Weeks    Status  New    Target Date  03/15/18        PT Long Term Goals - 02/15/18 1351      PT LONG TERM GOAL #1   Title  Patient will increase six minute walk test distance to >1000 for progression to community ambulator and improve gait ability    Time  8    Period  Weeks    Status  New    Target Date  04/12/18      PT LONG TERM GOAL #2   Title  Patient will increase 10 meter walk test to >1.2m/s as to improve gait speed for better community ambulation and to reduce fall risk.    Time  8    Period  Weeks    Status  New    Target Date  04/12/18      PT LONG TERM GOAL #3   Title  Patient will report a worst pain of 3/10 on VAS in    left ankle     to improve tolerance with ADLs and reduced symptoms with activities.     Time  8    Period  Weeks    Status  New    Target Date  04/12/18      PT LONG TERM GOAL #4   Title  patient will be able to tolerate standing without the boot for 6-8 hours to be able to go back to work    Time  8    Period  Weeks    Status  New    Target Date  04/12/18             Plan - 02/15/18 1313    Clinical Impression Statement  Patient is 52 yr old female with recent left ankle sprain/strain. She is walking in a left ankle boot and is having intermittent pain to left ankle 0-5/10. She has tenderness to left ankle on lateral and medial side and anterior ankle. She does not have any swelling in her foot. She has decreased ROM left ankle limited by pain; decreased strength to left ankle 2/5 DF/PF , 1/5 eversion. She has decreased gait speed, 5 x sit to stand and 6 MW test. She has decreased standing time due to increased pain during standing and weight bearing. Patient will benefit from skilled PT to  improve strength and ROM to left ankle and return to prior level of mobility to be able to return to  work.     Clinical Presentation  Stable    Clinical Decision Making  Low    Rehab Potential  Good    PT Frequency  2x / week    PT Duration  8 weeks    PT Treatment/Interventions  Passive range of motion;Patient/family education;Therapeutic exercise;Therapeutic activities;Moist Heat;Electrical Stimulation;Cryotherapy;Ultrasound    PT Next Visit Plan  HEP review and progression of exercises    Consulted and Agree with Plan of Care  Patient       Patient will benefit from skilled therapeutic intervention in order to improve the following deficits and impairments:  Abnormal gait, Decreased balance, Difficulty walking, Decreased activity tolerance, Decreased strength, Impaired flexibility, Pain, Decreased range of motion  Visit Diagnosis: Difficulty in walking, not elsewhere classified  Pain in left ankle and joints of left foot  Muscle weakness (generalized)     Problem List Patient Active Problem List   Diagnosis Date Noted  . Splenomegaly 05/22/2014  . Anemia 05/22/2014  . Obesity (BMI 30-39.9) 02/26/2014  . Diabetes (HCC) 11/15/2012  . Abdominal pain, right side. 10/23/2011    Ezekiel Ina, PT DPT 02/15/2018, 2:03 PM  [facility] MAIN [facility] SERVICES [address] [place], [place], [address] Phone: [phone]   Fax:  [phone]  Name: Alexis Campos MRN: [id number] Date of Birth: April 09, 1966

## 2018-02-22 ENCOUNTER — Ambulatory Visit: Payer: 59 | Admitting: Physical Therapy

## 2018-02-22 ENCOUNTER — Encounter: Payer: Self-pay | Admitting: Physical Therapy

## 2018-02-22 DIAGNOSIS — M6281 Muscle weakness (generalized): Secondary | ICD-10-CM | POA: Diagnosis not present

## 2018-02-22 DIAGNOSIS — M25572 Pain in left ankle and joints of left foot: Secondary | ICD-10-CM | POA: Diagnosis not present

## 2018-02-22 DIAGNOSIS — R262 Difficulty in walking, not elsewhere classified: Secondary | ICD-10-CM | POA: Diagnosis not present

## 2018-02-22 NOTE — Therapy (Signed)
[facility] MAIN [facility] SERVICES [address] [place], [place], [address] Phone: [phone]   Fax:  [phone]  Physical Therapy Treatment  Patient Details  Name: Alexis Campos MRN: [id number] Date of Birth: [birth year] Referring Provider: Frederico Hamman   Encounter Date: 02/22/2018  PT End of Session - 02/22/18 0826    Visit Number  2    Number of Visits  17    Date for PT Re-Evaluation  04/12/18    PT Start Time  0803    PT Stop Time  0845    PT Time Calculation (min)  42 min    Equipment Utilized During Treatment  Gait belt    Activity Tolerance  Patient tolerated treatment well    Behavior During Therapy  [facility] for tasks assessed/performed       Past Medical History:  Diagnosis Date  . Anemia   . Anemia 05/22/2014  . Blood transfusion   . Diabetes mellitus    diagnosed 1996-insulin and metformin  . Endometriosis   . Fibromyalgia   . GERD (gastroesophageal reflux disease)   . History of blood transfusion 12/09   [facility]  . Hyperlipidemia   . Hypertension   . Neuromuscular disorder (HCC)    fibromyalgia  . Seasonal allergies    recent bronchitis-chest xray/ct scan-granuloma noted present since 2008  . Sinusitis   . Splenomegaly 05/22/2014    Past Surgical History:  Procedure Laterality Date  . ABDOMINAL SURGERY     LOA for endomeriosis  . APPENDECTOMY  05/1995  . CESAREAN SECTION    . CHOLECYSTECTOMY  01/1995  . COLONOSCOPY    . lysis of adhesions  1996/ 2002  . TONSILLECTOMY  09/1994    There were no vitals filed for this visit.  Subjective Assessment - 02/22/18 0816    Subjective  Patient is trying not to wear her large boot unless she leaves the house. Patient is wearing left brace and is wearing it during the day , standing for 2  hours and then needs to rest due to fatigue and weakness of left ankle. She has pain 3/10 left ankle.  Her range of pain is 3-5/10 intermittent.     Patient is accompained by:  Family  member    Pertinent History  Patient twised her left ankle june 7th. She was able to work but then it began getting worse. She is wearing a boot on left foot and she began wearing it on june 17th.  She has been taken out of work until July 9th . 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     How long can you stand comfortably?  with the boot she can stand for 30 mins but it swells    How long can you walk comfortably?  She can walk 10 mins slow and easy with boot    Patient Stated Goals  to get back to work 12 hour shifts and walk required distances    Currently in Pain?  Yes    Pain Score  3     Pain Location  Ankle    Pain Orientation  Left    Pain Descriptors / Indicators  Burning;Aching;Spasm tearing    Pain Type  Chronic pain    Pain Onset  More than a month ago       Treatment: AROM left ankle DF, PF x 10  AAROM  Left ankle inv/eve x 10 BAPS board CW, CCW x 20  seated DF stretch x 30 sec x 3  Instructed in how to don/doff ankle brace  Patient is wearing the ankle brace all day and is able to stand for 1-2 hours before needing a seated rest.  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                        PT Education - 02/22/18 0826    Education Details  HEP     Person(s) Educated  Patient    Methods  Explanation    Comprehension  Verbalized understanding;Returned demonstration       PT Short Term Goals - 02/15/18 1350      PT SHORT TERM GOAL #1   Title  Patient will be independent in home exercise program to improve strength/mobility for better functional independence with ADLs.    Time  4    Period  Weeks    Status  New    Target Date  03/15/18      PT SHORT TERM GOAL #2   Title   Patient (< 52 years old) will complete five times sit to stand test in < 10 seconds indicating an increased LE strength and improved balance.    Time  4     Period  Weeks    Status  New    Target Date  03/15/18        PT Long Term Goals - 02/15/18 1351      PT LONG TERM GOAL #1   Title  Patient will increase six minute walk test distance to >1000 for progression to community ambulator and improve gait ability    Time  8    Period  Weeks    Status  New    Target Date  04/12/18      PT LONG TERM GOAL #2   Title  Patient will increase 10 meter walk test to >1.732m/s as to improve gait speed for better community ambulation and to reduce fall risk.    Time  8    Period  Weeks    Status  New    Target Date  04/12/18      PT LONG TERM GOAL #3   Title  Patient will report a worst pain of 3/10 on VAS in    left ankle     to improve tolerance with ADLs and reduced symptoms with activities.     Time  8    Period  Weeks    Status  New    Target Date  04/12/18      PT LONG TERM GOAL #4   Title  patient will be able to tolerate standing without the boot for 6-8 hours to be able to go back to work    Time  8    Period  Weeks    Status  New    Target Date  04/12/18            Plan - 02/22/18 0827    Clinical Impression Statement  Patient is able to ambulate without brace but has foot rolled over to lateral side with foot in inversion with very slow gait speed. She is able to keep her left foot in neutral with left stretch brace but continues to have very slow gait speed. She has poor strength of left  ankle and decreased ROM to left ankle limited but pain. Patient performs AROM in limited painfree range to left ankle. She will continue to benefit from skilled PT to improve strength and ROM to left ankle.     Rehab Potential  Good    PT Frequency  2x / week    PT Duration  8 weeks    PT Treatment/Interventions  Passive range of motion;Patient/family education;Therapeutic exercise;Therapeutic activities;Moist Heat;Electrical Stimulation;Cryotherapy;Ultrasound    PT Next Visit Plan  HEP review and progression of exercises    Consulted  and Agree with Plan of Care  Patient       Patient will benefit from skilled therapeutic intervention in order to improve the following deficits and impairments:  Abnormal gait, Decreased balance, Difficulty walking, Decreased activity tolerance, Decreased strength, Impaired flexibility, Pain, Decreased range of motion  Visit Diagnosis: Difficulty in walking, not elsewhere classified  Pain in left ankle and joints of left foot  Muscle weakness (generalized)     Problem List Patient Active Problem List   Diagnosis Date Noted  . Splenomegaly 05/22/2014  . Anemia 05/22/2014  . Obesity (BMI 30-39.9) 02/26/2014  . Diabetes (HCC) 11/15/2012  . Abdominal pain, right side. 10/23/2011    Ezekiel Ina, PT DPT 02/22/2018, 8:56 AM  [facility] MAIN [facility] SERVICES [address] [place], [place], [address] Phone: [phone]   Fax:  [phone]  Name: Alexis Campos MRN: [id number] Date of Birth: 02-15-1966

## 2018-02-24 ENCOUNTER — Encounter: Payer: Self-pay | Admitting: Physical Therapy

## 2018-02-24 ENCOUNTER — Ambulatory Visit: Payer: 59 | Admitting: Physical Therapy

## 2018-02-24 DIAGNOSIS — R262 Difficulty in walking, not elsewhere classified: Secondary | ICD-10-CM

## 2018-02-24 DIAGNOSIS — M25572 Pain in left ankle and joints of left foot: Secondary | ICD-10-CM | POA: Diagnosis not present

## 2018-02-24 DIAGNOSIS — M6281 Muscle weakness (generalized): Secondary | ICD-10-CM

## 2018-02-24 NOTE — Therapy (Signed)
[facility] MAIN [facility] SERVICES [address] [place], [place], [address] Phone: [phone]   Fax:  [phone]  Physical Therapy Treatment  Patient Details  Name: Alexis Campos MRN: [id number] Date of Birth: 02-16-1966 Referring Provider: Frederico Hamman   Encounter Date: 02/24/2018  PT End of Session - 02/24/18 1110    Visit Number  3    Number of Visits  17    Date for PT Re-Evaluation  04/12/18    PT Start Time  1100    PT Stop Time  1138    PT Time Calculation (min)  38 min    Equipment Utilized During Treatment  Gait belt    Activity Tolerance  Patient tolerated treatment well    Behavior During Therapy  [facility] for tasks assessed/performed       Past Medical History:  Diagnosis Date  . Anemia   . Anemia 05/22/2014  . Blood transfusion   . Diabetes mellitus    diagnosed 1996-insulin and metformin  . Endometriosis   . Fibromyalgia   . GERD (gastroesophageal reflux disease)   . History of blood transfusion 12/09   [facility]  . Hyperlipidemia   . Hypertension   . Neuromuscular disorder (HCC)    fibromyalgia  . Seasonal allergies    recent bronchitis-chest xray/ct scan-granuloma noted present since 2008  . Sinusitis   . Splenomegaly 05/22/2014    Past Surgical History:  Procedure Laterality Date  . ABDOMINAL SURGERY     LOA for endomeriosis  . APPENDECTOMY  05/1995  . CESAREAN SECTION    . CHOLECYSTECTOMY  01/1995  . COLONOSCOPY    . lysis of adhesions  1996/ 2002  . TONSILLECTOMY  09/1994    There were no vitals filed for this visit.  Subjective Assessment - 02/24/18 1107    Subjective  Patient is trying to wear her left brace on her ankle and her ankle is turning over when she weight bears on the LLE.     Patient is accompained by:  Family member    Pertinent History  Patient twised her left ankle june 7th. She was able to work but then it began getting worse. She is wearing a boot on left foot and she began wearing  it on june 17th.  She has been taken out of work until July 9th . 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     How long can you stand comfortably?  with the boot she can stand for 30 mins but it swells    How long can you walk comfortably?  She can walk 10 mins slow and easy with boot    Patient Stated Goals  to get back to work 12 hour shifts and walk required distances    Currently in Pain?  Yes    Pain Score  3     Pain Location  Ankle    Pain Orientation  Left    Pain Descriptors / Indicators  Aching;Nagging;Burning tearing    Pain Type  Chronic pain    Pain Onset  More than a month ago    Pain Frequency  Constant    Aggravating Factors   walking    Effect of Pain on Daily Activities  unable to stand longer than 1 hour       Treatment: AROM left ankle DF, PF x 10  PROM  Left ankle inv/eve x 10 BAPS board CW, CCW x 20 seated DF stretch  x 30 sec x 3  Instructed in how to don/doff ankle brace , added ace wrap to assist with swelling and ankle position during WB Patient is wearing the ankle brace all day and is able to stand for 1 hour before needing a seated rest.  Patient was advised to go back to wearing the boot if she is walking outside the house due to rolling the left ankle over and the ankle brace not giving enough support.                        PT Education - 02/24/18 1109    Education Details  HEP    Person(s) Educated  Patient    Methods  Explanation    Comprehension  Verbalized understanding       PT Short Term Goals - 02/15/18 1350      PT SHORT TERM GOAL #1   Title  Patient will be independent in home exercise program to improve strength/mobility for better functional independence with ADLs.    Time  4    Period  Weeks    Status  New    Target Date  03/15/18      PT SHORT TERM GOAL #2   Title   Patient (52 years old) will complete five times sit to stand test in < 10 seconds indicating an increased LE strength and improved  balance.    Time  4    Period  Weeks    Status  New    Target Date  03/15/18        PT Long Term Goals - 02/15/18 1351      PT LONG TERM GOAL #1   Title  Patient will increase six minute walk test distance to >1000 for progression to community ambulator and improve gait ability    Time  8    Period  Weeks    Status  New    Target Date  04/12/18      PT LONG TERM GOAL #2   Title  Patient will increase 10 meter walk test to >1.47m/s as to improve gait speed for better community ambulation and to reduce fall risk.    Time  8    Period  Weeks    Status  New    Target Date  04/12/18      PT LONG TERM GOAL #3   Title  Patient will report a worst pain of 3/10 on VAS in    left ankle     to improve tolerance with ADLs and reduced symptoms with activities.     Time  8    Period  Weeks    Status  New    Target Date  04/12/18      PT LONG TERM GOAL #4   Title  patient will be able to tolerate standing without the boot for 6-8 hours to be able to go back to work    Time  8    Period  Weeks    Status  New    Target Date  04/12/18            Plan - 02/24/18 1110    Clinical Impression Statement  Patient presents with poor active movement in left ankle dF/PF/eve/inv and pain with DF stretch across dorsum of foot and has tenderness at base of 5th MT and + tuning fork response over 5th MT. She reports that she had an x-ray and it  was negative. Patients ankle was wrapped in ace bandage and then ankle brace was donned tightly for increased support. She is not able to ambulate with heel toe pattern and rolls her foot during WB and has difficulty with getting her left great toe on the ground for pushoff. She ambulates with step to gait pattern due to decreased WB time on left foot. She was educated in HEP for stretcning her left ankle and AROM DF/PF and ice to decrease swelling and tendernss. Patient will continue to benefit from skilled PT to reduce pain, improve ROM and strength and  improve gait.     Rehab Potential  Good    PT Frequency  2x / week    PT Duration  8 weeks    PT Treatment/Interventions  Passive range of motion;Patient/family education;Therapeutic exercise;Therapeutic activities;Moist Heat;Electrical Stimulation;Cryotherapy;Ultrasound    PT Next Visit Plan  HEP review and progression of exercises    Consulted and Agree with Plan of Care  Patient       Patient will benefit from skilled therapeutic intervention in order to improve the following deficits and impairments:  Abnormal gait, Decreased balance, Difficulty walking, Decreased activity tolerance, Decreased strength, Impaired flexibility, Pain, Decreased range of motion  Visit Diagnosis: Difficulty in walking, not elsewhere classified  Pain in left ankle and joints of left foot  Muscle weakness (generalized)     Problem List Patient Active Problem List   Diagnosis Date Noted  . Splenomegaly 05/22/2014  . Anemia 05/22/2014  . Obesity (BMI 30-39.9) 02/26/2014  . Diabetes (HCC) 11/15/2012  . Abdominal pain, right side. 10/23/2011    Ezekiel InaMansfield, Tinie Mcgloin S, PT DPT 02/24/2018, 11:51 AM  [place] [facility] MAIN [facility] SERVICES [address] [place], [place], [address] Phone: [phone]   Fax:  [phone]  Name: Duard BradyKimberly A Sonier MRN: [id number] Date of Birth: April 15, 1966

## 2018-02-25 MED FILL — VIT D2 1.25 MG (50,000 UNIT: 1.25 MG | 84 days supply | Qty: 12 | Fill #2

## 2018-02-25 MED FILL — metFORMIN HCL 500 MG TABS: 500 | 90 days supply | Qty: 360 | Fill #1

## 2018-02-25 MED FILL — LEVOTHYROXINE 50 MCG TABLET: 50 | 30 days supply | Qty: 30 | Fill #2

## 2018-02-25 MED FILL — LISINOPRIL 2.5 MG TABLET: 2.5 | 90 days supply | Qty: 90 | Fill #1

## 2018-02-25 MED FILL — CITALOPRAM HBR 20 MG TABLET: 20 | 90 days supply | Qty: 90 | Fill #2

## 2018-02-25 MED FILL — FREESTYLE LIBRE 14 DAY SENS: 84 days supply | Qty: 6 | Fill #2

## 2018-03-01 ENCOUNTER — Ambulatory Visit: Payer: 59 | Admitting: Physical Therapy

## 2018-03-01 ENCOUNTER — Encounter: Payer: Self-pay | Admitting: Physical Therapy

## 2018-03-01 DIAGNOSIS — M25572 Pain in left ankle and joints of left foot: Secondary | ICD-10-CM

## 2018-03-01 DIAGNOSIS — M6281 Muscle weakness (generalized): Secondary | ICD-10-CM | POA: Diagnosis not present

## 2018-03-01 DIAGNOSIS — R262 Difficulty in walking, not elsewhere classified: Secondary | ICD-10-CM

## 2018-03-01 NOTE — Therapy (Signed)
[facility] MAIN [facility] SERVICES [address] [place], [place], [address] Phone: [phone]   Fax:  [phone]  Physical Therapy Treatment  Patient Details  Name: ARMANDINA IMAN MRN: [id number] Date of Birth: 08-19-1965 Referring Provider: Frederico Hamman   Encounter Date: 03/01/2018  PT End of Session - 03/01/18 0810    Visit Number  4    Number of Visits  17    Date for PT Re-Evaluation  04/12/18    PT Start Time  0805    PT Stop Time  0845    PT Time Calculation (min)  40 min    Equipment Utilized During Treatment  Gait belt    Activity Tolerance  Patient tolerated treatment well    Behavior During Therapy  [facility] for tasks assessed/performed       Past Medical History:  Diagnosis Date  . Anemia   . Anemia 05/22/2014  . Blood transfusion   . Diabetes mellitus    diagnosed 1996-insulin and metformin  . Endometriosis   . Fibromyalgia   . GERD (gastroesophageal reflux disease)   . History of blood transfusion 12/09   [facility]  . Hyperlipidemia   . Hypertension   . Neuromuscular disorder (HCC)    fibromyalgia  . Seasonal allergies    recent bronchitis-chest xray/ct scan-granuloma noted present since 2008  . Sinusitis   . Splenomegaly 05/22/2014    Past Surgical History:  Procedure Laterality Date  . ABDOMINAL SURGERY     LOA for endomeriosis  . APPENDECTOMY  05/1995  . CESAREAN SECTION    . CHOLECYSTECTOMY  01/1995  . COLONOSCOPY    . lysis of adhesions  1996/ 2002  . TONSILLECTOMY  09/1994    There were no vitals filed for this visit.  Subjective Assessment - 03/01/18 0809    Subjective  Patient is now wearing the boot because her foot feels better and more supported in it. She has 2/10 pain left ankle.    Patient is accompained by:  Family member    Pertinent History  Patient twised her left ankle june 7th. She was able to work but then it began getting worse. She is wearing a boot on left foot and she began wearing  it on june 17th.  She has been taken out of work until July 9th . 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     How long can you stand comfortably?  with the boot she can stand for 30 mins but it swells    How long can you walk comfortably?  She can walk 10 mins slow and easy with boot    Patient Stated Goals  to get back to work 12 hour shifts and walk required distances    Pain Score  2     Pain Location  Ankle    Pain Orientation  Left    Pain Descriptors / Indicators  Aching    Pain Type  Chronic pain    Pain Onset  More than a month ago       Treatment: AROM left ankle DF, PF x 10 x 2 sets  PROM Left ankle inv/eve x 10 AAROM left ankle eversion x 5 with very limited , 5 degrees BAPS board CW, CCW x 20 seated DF stretch x 30 sec x 3  Instructed in how to don/doff ankle brace , added ace wrap to assist with swelling and ankle position during WB Patient is wearing the  ankle brace all day and is able to stand for 1 hour before needing a seated rest.  Patient was advised to go back to wearing the boot if she is walking outside the house due to rolling the left ankle over and the ankle brace not giving enough support.                         PT Education - 03/01/18 0810    Education Details  HEP    Person(s) Educated  Patient    Methods  Explanation    Comprehension  Verbalized understanding       PT Short Term Goals - 02/15/18 1350      PT SHORT TERM GOAL #1   Title  Patient will be independent in home exercise program to improve strength/mobility for better functional independence with ADLs.    Time  4    Period  Weeks    Status  New    Target Date  03/15/18      PT SHORT TERM GOAL #2   Title   Patient (< 52 years old) will complete five times sit to stand test in < 10 seconds indicating an increased LE strength and improved balance.    Time  4    Period  Weeks    Status  New    Target Date  03/15/18        PT Long Term Goals -  02/15/18 1351      PT LONG TERM GOAL #1   Title  Patient will increase six minute walk test distance to >1000 for progression to community ambulator and improve gait ability    Time  8    Period  Weeks    Status  New    Target Date  04/12/18      PT LONG TERM GOAL #2   Title  Patient will increase 10 meter walk test to >1.6445m/s as to improve gait speed for better community ambulation and to reduce fall risk.    Time  8    Period  Weeks    Status  New    Target Date  04/12/18      PT LONG TERM GOAL #3   Title  Patient will report a worst pain of 3/10 on VAS in    left ankle     to improve tolerance with ADLs and reduced symptoms with activities.     Time  8    Period  Weeks    Status  New    Target Date  04/12/18      PT LONG TERM GOAL #4   Title  patient will be able to tolerate standing without the boot for 6-8 hours to be able to go back to work    Time  8    Period  Weeks    Status  New    Target Date  04/12/18            Plan - 03/01/18 0811    Clinical Impression Statement  Patient presents with poor active movement in left ankle dF/PF/eve/inv and pain with DF stretch across dorsum of foot and has tenderness at base of 5th MT and + tuning fork response over 5th MT. She reports that she had an x-ray and it was negative. Patients ankle was wrapped in ace bandage and then ankle brace was donned tightly for increased support. She is not able to ambulate with heel toe pattern  and rolls her foot during WB and has difficulty with getting her left great toe on the ground for pushoff. She ambulates with step to gait pattern due to decreased WB time on left foot. She was educated in HEP for stretcning her left ankle and AROM DF/PF and ice to decrease swelling and tendernss. Patient will continue to benefit from skilled PT to reduce pain, improve ROM and strength and improve gait.    Rehab Potential  Good    PT Frequency  2x / week    PT Duration  8 weeks    PT  Treatment/Interventions  Passive range of motion;Patient/family education;Therapeutic exercise;Therapeutic activities;Moist Heat;Electrical Stimulation;Cryotherapy;Ultrasound    PT Next Visit Plan  HEP review and progression of exercises    Consulted and Agree with Plan of Care  Patient       Patient will benefit from skilled therapeutic intervention in order to improve the following deficits and impairments:  Abnormal gait, Decreased balance, Difficulty walking, Decreased activity tolerance, Decreased strength, Impaired flexibility, Pain, Decreased range of motion  Visit Diagnosis: Difficulty in walking, not elsewhere classified  Pain in left ankle and joints of left foot  Muscle weakness (generalized)     Problem List Patient Active Problem List   Diagnosis Date Noted  . Splenomegaly 05/22/2014  . Anemia 05/22/2014  . Obesity (BMI 30-39.9) 02/26/2014  . Diabetes (HCC) 11/15/2012  . Abdominal pain, right side. 10/23/2011    Ezekiel Ina, PT DPT 03/01/2018, 8:16 AM  [facility] MAIN [facility] SERVICES [address] [place], [place], [address] Phone: [phone]   Fax:  [phone]  Name: SHARETA FISHBAUGH MRN: [id number] Date of Birth: 12-06-1965

## 2018-03-03 ENCOUNTER — Ambulatory Visit: Payer: 59 | Admitting: Physical Therapy

## 2018-03-03 ENCOUNTER — Encounter: Payer: Self-pay | Admitting: Physical Therapy

## 2018-03-03 DIAGNOSIS — M6281 Muscle weakness (generalized): Secondary | ICD-10-CM | POA: Diagnosis not present

## 2018-03-03 DIAGNOSIS — R262 Difficulty in walking, not elsewhere classified: Secondary | ICD-10-CM

## 2018-03-03 DIAGNOSIS — M25572 Pain in left ankle and joints of left foot: Secondary | ICD-10-CM | POA: Diagnosis not present

## 2018-03-03 NOTE — Therapy (Signed)
[facility] MAIN [facility] SERVICES [address] [place], [place], [address] Phone: [phone]   Fax:  [phone]  Physical Therapy Treatment  Patient Details  Name: Alexis BradyKimberly A Campos MRN: [id number] Date of Birth: 21-Dec-1965 Referring Provider: Frederico HammanAFFREY, DANIEL   Encounter Date: 03/03/2018  PT End of Session - 03/03/18 1401    Visit Number  5    Number of Visits  17    Date for PT Re-Evaluation  04/12/18    PT Start Time  0145    PT Stop Time  0230    PT Time Calculation (min)  45 min    Equipment Utilized During Treatment  Gait belt    Activity Tolerance  Patient tolerated treatment well    Behavior During Therapy  [facility] for tasks assessed/performed       Past Medical History:  Diagnosis Date  . Anemia   . Anemia 05/22/2014  . Blood transfusion   . Diabetes mellitus    diagnosed 1996-insulin and metformin  . Endometriosis   . Fibromyalgia   . GERD (gastroesophageal reflux disease)   . History of blood transfusion 12/09   [facility]  . Hyperlipidemia   . Hypertension   . Neuromuscular disorder (HCC)    fibromyalgia  . Seasonal allergies    recent bronchitis-chest xray/ct scan-granuloma noted present since 2008  . Sinusitis   . Splenomegaly 05/22/2014    Past Surgical History:  Procedure Laterality Date  . ABDOMINAL SURGERY     LOA for endomeriosis  . APPENDECTOMY  05/1995  . CESAREAN SECTION    . CHOLECYSTECTOMY  01/1995  . COLONOSCOPY    . lysis of adhesions  1996/ 2002  . TONSILLECTOMY  09/1994    There were no vitals filed for this visit.  Subjective Assessment - 03/03/18 1357    Subjective  Patient is now wearing the boot because her foot feels better and more supported in it. She has 2/10 pain left ankle.    Patient is accompained by:  Family member    Pertinent History  Patient twised her left ankle june 7th. She was able to work but then it began getting worse. She is wearing a boot on left foot and she began wearing  it on june 17th.  She has been taken out of work until July 9th . 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     How long can you stand comfortably?  with the boot she can stand for 30 mins but it swells    How long can you walk comfortably?  She can walk 10 mins slow and easy with boot    Patient Stated Goals  to get back to work 12 hour shifts and walk required distances    Currently in Pain?  Yes    Pain Score  2     Pain Location  Ankle    Pain Orientation  Left    Pain Descriptors / Indicators  Aching    Pain Onset  More than a month ago       Treatment: AROM left ankle DF, PF x 10 x 2 sets  PROM Left ankle inv/eve x 10 AAROM left ankle eversion x 5 with very limited , 5 degrees BAPS board CW, CCW x 20 seated DF stretch x 30 sec x 3  Instructed in how to don/doff ankle brace, added ace wrap to assist with swelling and ankle position during WB Patient is wearing the boot  brace all day and is able to stand for several  hours before needing a seated rest.                         PT Education - 03/03/18 1359    Education Details  HEP    Person(s) Educated  Patient    Methods  Explanation    Comprehension  Verbalized understanding       PT Short Term Goals - 02/15/18 1350      PT SHORT TERM GOAL #1   Title  Patient will be independent in home exercise program to improve strength/mobility for better functional independence with ADLs.    Time  4    Period  Weeks    Status  New    Target Date  03/15/18      PT SHORT TERM GOAL #2   Title   Patient (< 4 years old) will complete five times sit to stand test in < 10 seconds indicating an increased LE strength and improved balance.    Time  4    Period  Weeks    Status  New    Target Date  03/15/18        PT Long Term Goals - 02/15/18 1351      PT LONG TERM GOAL #1   Title  Patient will increase six minute walk test distance to >1000 for progression to community ambulator and improve  gait ability    Time  8    Period  Weeks    Status  New    Target Date  04/12/18      PT LONG TERM GOAL #2   Title  Patient will increase 10 meter walk test to >1.61m/s as to improve gait speed for better community ambulation and to reduce fall risk.    Time  8    Period  Weeks    Status  New    Target Date  04/12/18      PT LONG TERM GOAL #3   Title  Patient will report a worst pain of 3/10 on VAS in    left ankle     to improve tolerance with ADLs and reduced symptoms with activities.     Time  8    Period  Weeks    Status  New    Target Date  04/12/18      PT LONG TERM GOAL #4   Title  patient will be able to tolerate standing without the boot for 6-8 hours to be able to go back to work    Time  8    Period  Weeks    Status  New    Target Date  04/12/18            Plan - 03/03/18 1438    Clinical Impression Statement  Patient continues to have decreased AROM, PROM to left ankle musculature iwth pain in AROM and PROM to left ankle. She has decreased gait speed and ambulates in boot for better comfort. She has poor strength to left andkle and motion is greatly reduced. Patiet will oontine to improve and benefti from skilled PT to improve mobility and strength to left ankle.     Rehab Potential  Good    PT Frequency  2x / week    PT Duration  8 weeks    PT Treatment/Interventions  Passive range of motion;Patient/family education;Therapeutic exercise;Therapeutic activities;Moist Heat;Electrical Stimulation;Cryotherapy;Ultrasound    PT  Next Visit Plan  HEP review and progression of exercises    Consulted and Agree with Plan of Care  Patient       Patient will benefit from skilled therapeutic intervention in order to improve the following deficits and impairments:  Abnormal gait, Decreased balance, Difficulty walking, Decreased activity tolerance, Decreased strength, Impaired flexibility, Pain, Decreased range of motion  Visit Diagnosis: Difficulty in walking, not  elsewhere classified  Pain in left ankle and joints of left foot  Muscle weakness (generalized)     Problem List Patient Active Problem List   Diagnosis Date Noted  . Splenomegaly 05/22/2014  . Anemia 05/22/2014  . Obesity (BMI 30-39.9) 02/26/2014  . Diabetes (HCC) 11/15/2012  . Abdominal pain, right side. 10/23/2011    Ezekiel Ina , PT DPT 03/03/2018, 2:43 PM   [facility] MAIN [facility] SERVICES [address] [place], [place], [address] Phone: [phone]   Fax:  [phone]  Name: NANDI TONNESEN MRN: [id number] Date of Birth: 25-Jan-1966

## 2018-03-07 ENCOUNTER — Ambulatory Visit: Payer: 59 | Admitting: Physical Therapy

## 2018-03-09 ENCOUNTER — Encounter: Payer: Self-pay | Admitting: Physical Therapy

## 2018-03-09 ENCOUNTER — Ambulatory Visit: Payer: 59 | Admitting: Physical Therapy

## 2018-03-09 DIAGNOSIS — M25572 Pain in left ankle and joints of left foot: Secondary | ICD-10-CM

## 2018-03-09 DIAGNOSIS — M6281 Muscle weakness (generalized): Secondary | ICD-10-CM

## 2018-03-09 DIAGNOSIS — R262 Difficulty in walking, not elsewhere classified: Secondary | ICD-10-CM

## 2018-03-09 NOTE — Therapy (Signed)
[facility] MAIN [facility] SERVICES [address] [place], [place], [address] Phone: [phone]   Fax:  [phone]  Physical Therapy Treatment  Patient Details  Name: Alexis Campos MRN: [id number] Date of Birth: 19-Jun-1966 Referring Provider: Frederico Hamman   Encounter Date: 03/09/2018  PT End of Session - 03/09/18 0815    Visit Number  6    Number of Visits  17    Date for PT Re-Evaluation  04/12/18    PT Start Time  0800    PT Stop Time  0840    PT Time Calculation (min)  40 min    Equipment Utilized During Treatment  Gait belt    Activity Tolerance  Patient tolerated treatment well    Behavior During Therapy  [facility] for tasks assessed/performed       Past Medical History:  Diagnosis Date  . Anemia   . Anemia 05/22/2014  . Blood transfusion   . Diabetes mellitus    diagnosed 1996-insulin and metformin  . Endometriosis   . Fibromyalgia   . GERD (gastroesophageal reflux disease)   . History of blood transfusion 12/09   [facility]  . Hyperlipidemia   . Hypertension   . Neuromuscular disorder (HCC)    fibromyalgia  . Seasonal allergies    recent bronchitis-chest xray/ct scan-granuloma noted present since 2008  . Sinusitis   . Splenomegaly 05/22/2014    Past Surgical History:  Procedure Laterality Date  . ABDOMINAL SURGERY     LOA for endomeriosis  . APPENDECTOMY  05/1995  . CESAREAN SECTION    . CHOLECYSTECTOMY  01/1995  . COLONOSCOPY    . lysis of adhesions  1996/ 2002  . TONSILLECTOMY  09/1994    There were no vitals filed for this visit.  Subjective Assessment - 03/09/18 0814    Subjective  Patient is now wearing the boot because her foot feels better and more supported in it. She has 2/10 pain left ankle.    Patient is accompained by:  Family member    Pertinent History  Patient twised her left ankle june 7th. She was able to work but then it began getting worse. She is wearing a boot on left foot and she began wearing  it on june 17th.  She has been taken out of work until July 9th . 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     How long can you stand comfortably?  with the boot she can stand for 30 mins but it swells    How long can you walk comfortably?  She can walk 10 mins slow and easy with boot    Patient Stated Goals  to get back to work 12 hour shifts and walk required distances    Pain Score  4     Pain Location  Ankle    Pain Orientation  Left    Pain Descriptors / Indicators  Aching    Pain Onset  More than a month ago       Treatment: AROM left ankle DF, PF x 10x 2 sets PROM Left ankle inv/eve x 10 AAROM left ankle eversion x 5 with very limited , 5 degrees BAPS board CW, CCW x 20 seated DF stretch x 30 sec x 3  Instructed in how to don/doff ankle brace, added ace wrap to assist with swelling and ankle position during WB Patient is wearing the boot brace all day and is able to stand for several  hours before needing a seated rest.                        PT Education - 03/09/18 0815    Education Details  HEP    Person(s) Educated  Patient    Methods  Explanation    Comprehension  Verbalized understanding       PT Short Term Goals - 02/15/18 1350      PT SHORT TERM GOAL #1   Title  Patient will be independent in home exercise program to improve strength/mobility for better functional independence with ADLs.    Time  4    Period  Weeks    Status  New    Target Date  03/15/18      PT SHORT TERM GOAL #2   Title   Patient (< 52 years old) will complete five times sit to stand test in < 10 seconds indicating an increased LE strength and improved balance.    Time  4    Period  Weeks    Status  New    Target Date  03/15/18        PT Long Term Goals - 02/15/18 1351      PT LONG TERM GOAL #1   Title  Patient will increase six minute walk test distance to >1000 for progression to community ambulator and improve gait ability    Time  8     Period  Weeks    Status  New    Target Date  04/12/18      PT LONG TERM GOAL #2   Title  Patient will increase 10 meter walk test to >1.4587m/s as to improve gait speed for better community ambulation and to reduce fall risk.    Time  8    Period  Weeks    Status  New    Target Date  04/12/18      PT LONG TERM GOAL #3   Title  Patient will report a worst pain of 3/10 on VAS in    left ankle     to improve tolerance with ADLs and reduced symptoms with activities.     Time  8    Period  Weeks    Status  New    Target Date  04/12/18      PT LONG TERM GOAL #4   Title  patient will be able to tolerate standing without the boot for 6-8 hours to be able to go back to work    Time  8    Period  Weeks    Status  New    Target Date  04/12/18            Plan - 03/09/18 0816    Clinical Impression Statement  Patient has 4/10 left ankle pain and -3/5 strength DF/PF and 2+/5 Eve/inv strength with decreased ROM 5 deg DF. Patient has decreased standing toleance of 2 hours wearing the boot, and decreased activty tolerance of 2 hours wearing the boot due to weakness and pain to left ankle. The left ankle is rolling laterally without support with weight bearing and continues to need the support of the boot.     Rehab Potential  Good    PT Frequency  2x / week    PT Duration  8 weeks    PT Treatment/Interventions  Passive range of motion;Patient/family education;Therapeutic exercise;Therapeutic activities;Moist Heat;Electrical Stimulation;Cryotherapy;Ultrasound    PT Next Visit Plan  HEP  review and progression of exercises    Consulted and Agree with Plan of Care  Patient       Patient will benefit from skilled therapeutic intervention in order to improve the following deficits and impairments:  Abnormal gait, Decreased balance, Difficulty walking, Decreased activity tolerance, Decreased strength, Impaired flexibility, Pain, Decreased range of motion  Visit Diagnosis: Difficulty in walking,  not elsewhere classified  Pain in left ankle and joints of left foot  Muscle weakness (generalized)     Problem List Patient Active Problem List   Diagnosis Date Noted  . Splenomegaly 05/22/2014  . Anemia 05/22/2014  . Obesity (BMI 30-39.9) 02/26/2014  . Diabetes (HCC) 11/15/2012  . Abdominal pain, right side. 10/23/2011    Ezekiel Ina, PT DPT 03/09/2018, 8:22 AM  [facility] [facility] MAIN [facility] SERVICES [address] [place], [place], [address] Phone: [phone]   Fax:  [phone]  Name: Alexis Campos MRN: [id number] Date of Birth: 1966/08/17

## 2018-03-10 ENCOUNTER — Ambulatory Visit: Payer: 59 | Admitting: Physical Therapy

## 2018-03-10 ENCOUNTER — Encounter: Payer: Self-pay | Admitting: Physical Therapy

## 2018-03-10 DIAGNOSIS — M6281 Muscle weakness (generalized): Secondary | ICD-10-CM | POA: Diagnosis not present

## 2018-03-10 DIAGNOSIS — M25572 Pain in left ankle and joints of left foot: Secondary | ICD-10-CM | POA: Diagnosis not present

## 2018-03-10 DIAGNOSIS — R262 Difficulty in walking, not elsewhere classified: Secondary | ICD-10-CM

## 2018-03-10 MED FILL — predniSONE 10 MG TABS: 10 | 12 days supply | Qty: 48 | Fill #0

## 2018-03-10 NOTE — Therapy (Signed)
[facility] MAIN [facility] SERVICES [address] [place], [place], [address] Phone: [phone]   Fax:  [phone]  Physical Therapy Treatment  Patient Details  Name: STEPHANIA MACFARLANE MRN: [id number] Date of Birth: 1966-03-05 Referring Provider: Frederico Hamman   Encounter Date: 03/10/2018  PT End of Session - 03/10/18 1313    Visit Number  7    Number of Visits  17    Date for PT Re-Evaluation  04/12/18    PT Start Time  0105    PT Stop Time  0145    PT Time Calculation (min)  40 min    Equipment Utilized During Treatment  Gait belt    Activity Tolerance  Patient tolerated treatment well    Behavior During Therapy  [facility] for tasks assessed/performed       Past Medical History:  Diagnosis Date  . Anemia   . Anemia 05/22/2014  . Blood transfusion   . Diabetes mellitus    diagnosed 1996-insulin and metformin  . Endometriosis   . Fibromyalgia   . GERD (gastroesophageal reflux disease)   . History of blood transfusion 12/09   [facility]  . Hyperlipidemia   . Hypertension   . Neuromuscular disorder (HCC)    fibromyalgia  . Seasonal allergies    recent bronchitis-chest xray/ct scan-granuloma noted present since 2008  . Sinusitis   . Splenomegaly 05/22/2014    Past Surgical History:  Procedure Laterality Date  . ABDOMINAL SURGERY     LOA for endomeriosis  . APPENDECTOMY  05/1995  . CESAREAN SECTION    . CHOLECYSTECTOMY  01/1995  . COLONOSCOPY    . lysis of adhesions  1996/ 2002  . TONSILLECTOMY  09/1994    There were no vitals filed for this visit.  Subjective Assessment - 03/10/18 1310    Subjective  Patient is now wearing the boot because her foot feels better and more supported in it. She has 2/10 pain left ankle.    Patient is accompained by:  Family member    Pertinent History  Patient twised her left ankle june 7th. She was able to work but then it began getting worse. She is wearing a boot on left foot and she began wearing  it on june 17th.  She has been taken out of work until July 9th . 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     How long can you stand comfortably?  with the boot she can stand for 30 mins but it swells    How long can you walk comfortably?  She can walk 10 mins slow and easy with boot    Patient Stated Goals  to get back to work 12 hour shifts and walk required distances    Currently in Pain?  Yes    Pain Score  2     Pain Location  Ankle    Pain Descriptors / Indicators  Aching    Pain Onset  More than a month ago       Treatment: AROM left ankle DF, PF x 10x 2 sets PROM Left ankle inv/eve, DF/ PF x 10 AAROM left ankle eversion x 5 with very limited , 5 degrees BAPS board CW, CCW x 20 seated DF stretch x 30 sec x 3  Instructed in how to don/doff ankle brace, added ace wrap to assist with swelling and ankle position during WB Patient is wearing thebootbrace all day and is able to stand for  severalhoursbefore needing a seated rest.                        PT Education - 03/10/18 1312    Education Details  HEP    Person(s) Educated  Patient    Methods  Explanation    Comprehension  Verbalized understanding;Returned demonstration       PT Short Term Goals - 02/15/18 1350      PT SHORT TERM GOAL #1   Title  Patient will be independent in home exercise program to improve strength/mobility for better functional independence with ADLs.    Time  4    Period  Weeks    Status  New    Target Date  03/15/18      PT SHORT TERM GOAL #2   Title   Patient (< 52 years old) will complete five times sit to stand test in < 10 seconds indicating an increased LE strength and improved balance.    Time  4    Period  Weeks    Status  New    Target Date  03/15/18        PT Long Term Goals - 02/15/18 1351      PT LONG TERM GOAL #1   Title  Patient will increase six minute walk test distance to >1000 for progression to community ambulator and improve  gait ability    Time  8    Period  Weeks    Status  New    Target Date  04/12/18      PT LONG TERM GOAL #2   Title  Patient will increase 10 meter walk test to >1.107m/s as to improve gait speed for better community ambulation and to reduce fall risk.    Time  8    Period  Weeks    Status  New    Target Date  04/12/18      PT LONG TERM GOAL #3   Title  Patient will report a worst pain of 3/10 on VAS in    left ankle     to improve tolerance with ADLs and reduced symptoms with activities.     Time  8    Period  Weeks    Status  New    Target Date  04/12/18      PT LONG TERM GOAL #4   Title  patient will be able to tolerate standing without the boot for 6-8 hours to be able to go back to work    Time  8    Period  Weeks    Status  New    Target Date  04/12/18            Plan - 03/10/18 1313    Clinical Impression Statement  Patient continues to have decreased AROM, PROM to left ankle musculature iwth pain in AROM and PROM to left ankle. She has decreased gait speed and ambulates in boot for better comfort. She has poor strength to left andkle and motion is greatly reduced. Patiet will oontine to improve and benefti from skilled PT to improve mobility and strength to left ankle    Rehab Potential  Good    PT Frequency  2x / week    PT Duration  8 weeks    PT Treatment/Interventions  Passive range of motion;Patient/family education;Therapeutic exercise;Therapeutic activities;Moist Heat;Electrical Stimulation;Cryotherapy;Ultrasound    PT Next Visit Plan  HEP review and progression of exercises  Consulted and Agree with Plan of Care  Patient       Patient will benefit from skilled therapeutic intervention in order to improve the following deficits and impairments:  Abnormal gait, Decreased balance, Difficulty walking, Decreased activity tolerance, Decreased strength, Impaired flexibility, Pain, Decreased range of motion  Visit Diagnosis: Difficulty in walking, not elsewhere  classified  Pain in left ankle and joints of left foot  Muscle weakness (generalized)     Problem List Patient Active Problem List   Diagnosis Date Noted  . Splenomegaly 05/22/2014  . Anemia 05/22/2014  . Obesity (BMI 30-39.9) 02/26/2014  . Diabetes (HCC) 11/15/2012  . Abdominal pain, right side. 10/23/2011    Ezekiel InaMansfield, Theona Muhs S, PT DPT 03/10/2018, 1:16 PM  [place] [facility] MAIN [facility] [address] [place], [place], [address] Phone: [phone]   Fax:  [phone]  Name: Duard BradyKimberly A Atilano MRN: [id number] Date of Birth: [birth year]

## 2018-03-14 ENCOUNTER — Ambulatory Visit: Payer: 59 | Admitting: Physical Therapy

## 2018-03-14 ENCOUNTER — Encounter: Payer: Self-pay | Admitting: Physical Therapy

## 2018-03-14 DIAGNOSIS — R262 Difficulty in walking, not elsewhere classified: Secondary | ICD-10-CM | POA: Diagnosis not present

## 2018-03-14 DIAGNOSIS — M25572 Pain in left ankle and joints of left foot: Secondary | ICD-10-CM | POA: Diagnosis not present

## 2018-03-14 DIAGNOSIS — M6281 Muscle weakness (generalized): Secondary | ICD-10-CM

## 2018-03-14 NOTE — Therapy (Signed)
[facility] MAIN [facility] SERVICES [address] [place], [place], [address] Phone: [phone]   Fax:  [phone]  Physical Therapy Treatment  Patient Details  Name: Alexis Campos MRN: [id number] Date of Birth: 26-Dec-1965 Referring Provider: Frederico Hamman   Encounter Date: 03/14/2018  PT End of Session - 03/14/18 0808    Visit Number  8    Number of Visits  17    Date for PT Re-Evaluation  04/12/18    PT Start Time  0800    PT Stop Time  0845    PT Time Calculation (min)  45 min    Equipment Utilized During Treatment  Gait belt    Activity Tolerance  Patient tolerated treatment well    Behavior During Therapy  [facility] for tasks assessed/performed       Past Medical History:  Diagnosis Date  . Anemia   . Anemia 05/22/2014  . Blood transfusion   . Diabetes mellitus    diagnosed 1996-insulin and metformin  . Endometriosis   . Fibromyalgia   . GERD (gastroesophageal reflux disease)   . History of blood transfusion 12/09   [facility]  . Hyperlipidemia   . Hypertension   . Neuromuscular disorder (HCC)    fibromyalgia  . Seasonal allergies    recent bronchitis-chest xray/ct scan-granuloma noted present since 2008  . Sinusitis   . Splenomegaly 05/22/2014    Past Surgical History:  Procedure Laterality Date  . ABDOMINAL SURGERY     LOA for endomeriosis  . APPENDECTOMY  05/1995  . CESAREAN SECTION    . CHOLECYSTECTOMY  01/1995  . COLONOSCOPY    . lysis of adhesions  1996/ 2002  . TONSILLECTOMY  09/1994    There were no vitals filed for this visit.  Subjective Assessment - 03/14/18 0806    Subjective  Patient is on a steroid and her hands are shaking from it and she is not abel to sleep.     Patient is accompained by:  Family member    Pertinent History  Patient twised her left ankle june 7th. She was able to work but then it began getting worse. She is wearing a boot on left foot and she began wearing it on june 17th.  She has been  taken out of work until July 9th . 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     How long can you stand comfortably?  with the boot she can stand for 30 mins but it swells    How long can you walk comfortably?  She can walk 10 mins slow and easy with boot    Patient Stated Goals  to get back to work 12 hour shifts and walk required distances    Currently in Pain?  Yes    Pain Score  3     Pain Location  Ankle    Pain Orientation  Left    Pain Descriptors / Indicators  -- trearing    Pain Type  Chronic pain    Pain Onset  More than a month ago    Multiple Pain Sites  No       Treatment: AROM left ankle DF, PF x 10x 2 sets PROM Left ankle inv/eve, DF/ PF x 10 AAROM left ankle eversion x 5 with very limited , 5 degrees BAPS board CW, CCW x 20 seated DF stretch x 30 sec x 3  Instructed in how to don/doff ankle brace, added ace  wrap to assist with swelling and ankle position during WB Patient is wearing thebootbrace all day and is able to stand for severalhoursbefore needing a seated rest.                         PT Education - 03/14/18 0808    Education Details  HEP    Person(s) Educated  Patient    Methods  Explanation    Comprehension  Verbalized understanding       PT Short Term Goals - 02/15/18 1350      PT SHORT TERM GOAL #1   Title  Patient will be independent in home exercise program to improve strength/mobility for better functional independence with ADLs.    Time  4    Period  Weeks    Status  New    Target Date  03/15/18      PT SHORT TERM GOAL #2   Title   Patient (< 14 years old) will complete five times sit to stand test in < 10 seconds indicating an increased LE strength and improved balance.    Time  4    Period  Weeks    Status  New    Target Date  03/15/18        PT Long Term Goals - 02/15/18 1351      PT LONG TERM GOAL #1   Title  Patient will increase six minute walk test distance to >1000 for progression  to community ambulator and improve gait ability    Time  8    Period  Weeks    Status  New    Target Date  04/12/18      PT LONG TERM GOAL #2   Title  Patient will increase 10 meter walk test to >1.63m/s as to improve gait speed for better community ambulation and to reduce fall risk.    Time  8    Period  Weeks    Status  New    Target Date  04/12/18      PT LONG TERM GOAL #3   Title  Patient will report a worst pain of 3/10 on VAS in    left ankle     to improve tolerance with ADLs and reduced symptoms with activities.     Time  8    Period  Weeks    Status  New    Target Date  04/12/18      PT LONG TERM GOAL #4   Title  patient will be able to tolerate standing without the boot for 6-8 hours to be able to go back to work    Time  8    Period  Weeks    Status  New    Target Date  04/12/18            Plan - 03/14/18 0809    Clinical Impression Statement  Patient has 3/10 pain to left ankle and decreased strength with slow gait speed and wearing the boot. She performs AROM on Baps board and PROM stretcing in DF with no increased pain. She will continue to benefit from skilled PT to improve strength and ROM to left ankle.     Rehab Potential  Good    PT Frequency  2x / week    PT Duration  8 weeks    PT Treatment/Interventions  Passive range of motion;Patient/family education;Therapeutic exercise;Therapeutic activities;Moist Heat;Electrical Stimulation;Cryotherapy;Ultrasound    PT Next Visit Plan  HEP review and progression of exercises    Consulted and Agree with Plan of Care  Patient       Patient will benefit from skilled therapeutic intervention in order to improve the following deficits and impairments:  Abnormal gait, Decreased balance, Difficulty walking, Decreased activity tolerance, Decreased strength, Impaired flexibility, Pain, Decreased range of motion  Visit Diagnosis: Difficulty in walking, not elsewhere classified  Pain in left ankle and joints of left  foot  Muscle weakness (generalized)     Problem List Patient Active Problem List   Diagnosis Date Noted  . Splenomegaly 05/22/2014  . Anemia 05/22/2014  . Obesity (BMI 30-39.9) 02/26/2014  . Diabetes (HCC) 11/15/2012  . Abdominal pain, right side. 10/23/2011    Ezekiel InaMansfield, Dea Bitting S, PT DPT 03/14/2018, 8:19 AM  [place] [facility] [address] [place], [place], [address] Phone: [phone]   Fax:  [phone]  Name: Alexis Campos MRN: [id number] Date of Birth: 09-08-1965

## 2018-03-16 ENCOUNTER — Encounter: Payer: Self-pay | Admitting: Physical Therapy

## 2018-03-16 ENCOUNTER — Ambulatory Visit: Payer: 59 | Admitting: Physical Therapy

## 2018-03-16 DIAGNOSIS — R262 Difficulty in walking, not elsewhere classified: Secondary | ICD-10-CM

## 2018-03-16 DIAGNOSIS — M25572 Pain in left ankle and joints of left foot: Secondary | ICD-10-CM | POA: Diagnosis not present

## 2018-03-16 DIAGNOSIS — M6281 Muscle weakness (generalized): Secondary | ICD-10-CM

## 2018-03-16 NOTE — Therapy (Signed)
[place] MAIN [facility] SERVICES [address] [place], [place], [address] Phone: [phone]   Fax:  [phone]  Physical Therapy Treatment  Patient Details  Name: Alexis Campos MRN: [id number] Date of Birth: 1966/03/18 Referring Provider: Earlie Server   Encounter Date: 03/16/2018  PT End of Session - 03/16/18 0847    Visit Number  9    Number of Visits  17    Date for PT Re-Evaluation  04/12/18    PT Start Time  0805    PT Stop Time  0844    PT Time Calculation (min)  39 min    Equipment Utilized During Treatment  Gait belt    Activity Tolerance  Patient tolerated treatment well    Behavior During Therapy  [facility] for tasks assessed/performed       Past Medical History:  Diagnosis Date  . Anemia   . Anemia 05/22/2014  . Blood transfusion   . Diabetes mellitus    diagnosed 1996-insulin and metformin  . Endometriosis   . Fibromyalgia   . GERD (gastroesophageal reflux disease)   . History of blood transfusion 12/09   [facility]  . Hyperlipidemia   . Hypertension   . Neuromuscular disorder (HCC)    fibromyalgia  . Seasonal allergies    recent bronchitis-chest xray/ct scan-granuloma noted present since 2008  . Sinusitis   . Splenomegaly 05/22/2014    Past Surgical History:  Procedure Laterality Date  . ABDOMINAL SURGERY     LOA for endomeriosis  . APPENDECTOMY  05/1995  . CESAREAN SECTION    . CHOLECYSTECTOMY  01/1995  . COLONOSCOPY    . lysis of adhesions  1996/ 2002  . TONSILLECTOMY  09/1994    There were no vitals filed for this visit.  Subjective Assessment - 03/16/18 0818    Subjective  Patient is on a steroid and her hands are shaking from it and she is not abel to sleep. Her pain level is low but she says her left ankle feels tight and itchy".    Patient is accompained by:  Family member    Pertinent History  Patient twised her left ankle june 7th. She was able to work but then it began getting worse. She is wearing  a boot on left foot and she began wearing it on june 17th.  She has been taken out of work until July 9th . 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     How long can you stand comfortably?  with the boot she can stand for 30 mins but it swells    How long can you walk comfortably?  She can walk 10 mins slow and easy with boot    Patient Stated Goals  to get back to work 12 hour shifts and walk required distances    Currently in Pain?  Yes    Pain Score  2     Pain Location  Ankle    Pain Orientation  Left    Pain Descriptors / Indicators  Aching    Pain Onset  More than a month ago       Treatment: AROM left ankle DF, PF x 10x 2 sets PROM Left ankle inv/eve, DF/ PFx 10 AAROM left ankle eversion x 5 with very limited , 5 degrees BAPS board CW, CCW DF/ PF x 20 seated DF stretch x 30 sec x 3   Patient is wearing thebootbrace all day and is able to  stand for severalhoursbefore needing a seated rest.                        PT Education - 03/16/18 0847    Education Details  HEP    Person(s) Educated  Patient    Methods  Explanation    Comprehension  Verbalized understanding       PT Short Term Goals - 02/15/18 1350      PT SHORT TERM GOAL #1   Title  Patient will be independent in home exercise program to improve strength/mobility for better functional independence with ADLs.    Time  4    Period  Weeks    Status  New    Target Date  03/15/18      PT SHORT TERM GOAL #2   Title   Patient (52 years old) will complete five times sit to stand test in < 10 seconds indicating an increased LE strength and improved balance.    Time  4    Period  Weeks    Status  New    Target Date  03/15/18        PT Long Term Goals - 03/16/18 0850      PT LONG TERM GOAL #1   Title  Patient will increase six minute walk test distance to >1000 for progression to community ambulator and improve gait ability    Time  8    Period  Weeks    Status   Partially Met    Target Date  04/12/18      PT LONG TERM GOAL #2   Title  Patient will increase 10 meter walk test to >1.37ms as to improve gait speed for better community ambulation and to reduce fall risk.    Time  8    Period  Weeks    Status  Partially Met    Target Date  04/12/18      PT LONG TERM GOAL #3   Title  Patient will report a worst pain of 3/10 on VAS in    left ankle     to improve tolerance with ADLs and reduced symptoms with activities.     Baseline  2/10     Time  8    Period  Weeks    Status  Partially Met    Target Date  04/12/18      PT LONG TERM GOAL #4   Title  patient will be able to tolerate standing without the boot for 6-8 hours to be able to go back to work    Baseline  2 hours standing    Time  8    Period  Weeks    Status  Partially Met    Target Date  04/12/18            Plan - 03/16/18 0849    Clinical Impression Statement  Patient continues to have decreased AROM, PROM to left ankle musculature iwth pain in AROM and PROM to left ankle. She has decreased gait speed and ambulates in boot for better comfort. She has poor strength to left andkle and motion is greatly reduced. Patient will oontine to improve and benefit from skilled PT to improve mobility and strength to left ankle , increase standing tolerance and return to work.      Rehab Potential  Good    PT Frequency  2x / week    PT Duration  8 weeks  PT Treatment/Interventions  Passive range of motion;Patient/family education;Therapeutic exercise;Therapeutic activities;Moist Heat;Electrical Stimulation;Cryotherapy;Ultrasound    PT Next Visit Plan  HEP review and progression of exercises    Consulted and Agree with Plan of Care  Patient       Patient will benefit from skilled therapeutic intervention in order to improve the following deficits and impairments:  Abnormal gait, Decreased balance, Difficulty walking, Decreased activity tolerance, Decreased strength, Impaired flexibility,  Pain, Decreased range of motion  Visit Diagnosis: Difficulty in walking, not elsewhere classified  Pain in left ankle and joints of left foot  Muscle weakness (generalized)     Problem List Patient Active Problem List   Diagnosis Date Noted  . Splenomegaly 05/22/2014  . Anemia 05/22/2014  . Obesity (BMI 30-39.9) 02/26/2014  . Diabetes ([place]) 11/15/2012  . Abdominal pain, right side. 10/23/2011    Alanson Puls, PT DPT 03/16/2018, 8:53 AM  [place] MAIN [facility] SERVICES [address] [place], [place], [address] Phone: [phone]   Fax:  [phone]  Name: TRYSTA SHOWMAN MRN: [id number] Date of Birth: 11-14-1965

## 2018-03-21 ENCOUNTER — Ambulatory Visit: Payer: 59 | Attending: Orthopedic Surgery | Admitting: Physical Therapy

## 2018-03-21 DIAGNOSIS — M25572 Pain in left ankle and joints of left foot: Secondary | ICD-10-CM | POA: Insufficient documentation

## 2018-03-21 DIAGNOSIS — M6281 Muscle weakness (generalized): Secondary | ICD-10-CM | POA: Insufficient documentation

## 2018-03-21 DIAGNOSIS — R262 Difficulty in walking, not elsewhere classified: Secondary | ICD-10-CM | POA: Insufficient documentation

## 2018-03-22 ENCOUNTER — Encounter: Payer: Self-pay | Admitting: Physical Therapy

## 2018-03-22 NOTE — Therapy (Signed)
[place] MAIN [facility] SERVICES [address] [place], [place], [address] Phone: [phone]   Fax:  [phone]  Physical Therapy Treatment  Patient Details  Name: Alexis Campos MRN: [id number] Date of Birth: [birth year] Referring Provider: Earlie Server   Encounter Date: 03/21/2018  PT End of Session - 03/22/18 0835    Visit Number  10    Number of Visits  17    Date for PT Re-Evaluation  04/12/18    PT Start Time  0415    PT Stop Time  0500    PT Time Calculation (min)  45 min    Equipment Utilized During Treatment  Gait belt    Activity Tolerance  Patient tolerated treatment well    Behavior During Therapy  [facility] for tasks assessed/performed       Past Medical History:  Diagnosis Date  . Anemia   . Anemia 05/22/2014  . Blood transfusion   . Diabetes mellitus    diagnosed 1996-insulin and metformin  . Endometriosis   . Fibromyalgia   . GERD (gastroesophageal reflux disease)   . History of blood transfusion 12/09   [facility]  . Hyperlipidemia   . Hypertension   . Neuromuscular disorder (HCC)    fibromyalgia  . Seasonal allergies    recent bronchitis-chest xray/ct scan-granuloma noted present since 2008  . Sinusitis   . Splenomegaly 05/22/2014    Past Surgical History:  Procedure Laterality Date  . ABDOMINAL SURGERY     LOA for endomeriosis  . APPENDECTOMY  05/1995  . CESAREAN SECTION    . CHOLECYSTECTOMY  01/1995  . COLONOSCOPY    . lysis of adhesions  1996/ 2002  . TONSILLECTOMY  09/1994    There were no vitals filed for this visit.  Subjective Assessment - 03/22/18 0833    Subjective  Patient continues to have 2/10 pain is continues to need to wear the boot due to her ankle rolling laterally without support.     Patient is accompained by:  Family member    Pertinent History  Patient twised her left ankle june 7th. She was able to work but then it began getting worse. She is wearing a boot on left foot and she began  wearing it on june 17th.  She has been taken out of work until July 9th . 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     How long can you stand comfortably?  with the boot she can stand for 30 mins but it swells    How long can you walk comfortably?  She can walk 10 mins slow and easy with boot    Patient Stated Goals  to get back to work 12 hour shifts and walk required distances    Currently in Pain?  Yes    Pain Score  2     Pain Location  Ankle    Pain Onset  More than a month ago       Treatment: Standing ankle heel cord stretch x 30 sec x 3 Standing Baps board with medium level DF, PF, INV/EVE AROM left ankle DF, PF x 10x 2 sets PROM Left ankle inv/eve, DF/ PFx 10 AAROM left ankle eversion x 5 with very limited , 5 degrees DF stretch x 30 sec x 3   Patient is wearing thebootbrace all day and is able to stand for severalhoursbefore needing a seated rest.  PT Education - 03/22/18 0834    Education Details  HEP    Person(s) Educated  Patient    Methods  Explanation    Comprehension  Verbalized understanding       PT Short Term Goals - 02/15/18 1350      PT SHORT TERM GOAL #1   Title  Patient will be independent in home exercise program to improve strength/mobility for better functional independence with ADLs.    Time  4    Period  Weeks    Status  New    Target Date  03/15/18      PT SHORT TERM GOAL #2   Title   Patient (< 73 years old) will complete five times sit to stand test in < 10 seconds indicating an increased LE strength and improved balance.    Time  4    Period  Weeks    Status  New    Target Date  03/15/18        PT Long Term Goals - 03/16/18 0850      PT LONG TERM GOAL #1   Title  Patient will increase six minute walk test distance to >1000 for progression to community ambulator and improve gait ability    Time  8    Period  Weeks    Status  Partially Met    Target Date  04/12/18       PT LONG TERM GOAL #2   Title  Patient will increase 10 meter walk test to >1.72ms as to improve gait speed for better community ambulation and to reduce fall risk.    Time  8    Period  Weeks    Status  Partially Met    Target Date  04/12/18      PT LONG TERM GOAL #3   Title  Patient will report a worst pain of 3/10 on VAS in    left ankle     to improve tolerance with ADLs and reduced symptoms with activities.     Baseline  2/10     Time  8    Period  Weeks    Status  Partially Met    Target Date  04/12/18      PT LONG TERM GOAL #4   Title  patient will be able to tolerate standing without the boot for 6-8 hours to be able to go back to work    Baseline  2 hours standing    Time  8    Period  Weeks    Status  Partially Met    Target Date  04/12/18            Plan - 03/22/18 0837    Clinical Impression Statement  Patient continues to have decreased AROM, PROM to left ankle musculature iwth pain in AROM and PROM to left ankle. She has decreased gait speed and ambulates in boot for better comfort. She has poor strength to left andkle and motion is greatly reduced. Patiet will oontine to improve and benefti from skilled PT to improve mobility and strength to left ankle    Rehab Potential  Good    PT Frequency  2x / week    PT Duration  8 weeks    PT Treatment/Interventions  Passive range of motion;Patient/family education;Therapeutic exercise;Therapeutic activities;Moist Heat;Electrical Stimulation;Cryotherapy;Ultrasound    PT Next Visit Plan  HEP review and progression of exercises    Consulted and Agree with Plan of Care  Patient  Patient will benefit from skilled therapeutic intervention in order to improve the following deficits and impairments:  Abnormal gait, Decreased balance, Difficulty walking, Decreased activity tolerance, Decreased strength, Impaired flexibility, Pain, Decreased range of motion  Visit Diagnosis: Difficulty in walking, not elsewhere  classified  Pain in left ankle and joints of left foot  Muscle weakness (generalized)     Problem List Patient Active Problem List   Diagnosis Date Noted  . Splenomegaly 05/22/2014  . Anemia 05/22/2014  . Obesity (BMI 30-39.9) 02/26/2014  . Diabetes ([place]) 11/15/2012  . Abdominal pain, right side. 10/23/2011    Alanson Puls, PT DPT 03/22/2018, 8:39 AM  [place] MAIN [facility] SERVICES [address] [place], [place], [address] Phone: [phone]   Fax:  [phone]  Name: CORETHA CRESWELL MRN: [id number] Date of Birth: Jul 06, 1966

## 2018-03-23 ENCOUNTER — Encounter: Payer: Self-pay | Admitting: Physical Therapy

## 2018-03-23 ENCOUNTER — Ambulatory Visit: Payer: 59 | Admitting: Physical Therapy

## 2018-03-23 DIAGNOSIS — R262 Difficulty in walking, not elsewhere classified: Secondary | ICD-10-CM | POA: Diagnosis not present

## 2018-03-23 DIAGNOSIS — M6281 Muscle weakness (generalized): Secondary | ICD-10-CM

## 2018-03-23 DIAGNOSIS — M25572 Pain in left ankle and joints of left foot: Secondary | ICD-10-CM

## 2018-03-23 NOTE — Therapy (Signed)
[place] MAIN [facility] SERVICES [address] [place], [place], [address] Phone: [phone]   Fax:  [phone]  Physical Therapy Treatment  Patient Details  Name: Alexis Campos MRN: [id number] Date of Birth: 1965/12/05 Referring Provider: Earlie Server   Encounter Date: 03/23/2018  PT End of Session - 03/23/18 0825    Visit Number  11    Number of Visits  17    Date for PT Re-Evaluation  04/12/18    PT Start Time  0815    PT Stop Time  0900    PT Time Calculation (min)  45 min    Equipment Utilized During Treatment  Gait belt    Activity Tolerance  Patient tolerated treatment well    Behavior During Therapy  [facility] for tasks assessed/performed       Past Medical History:  Diagnosis Date  . Anemia   . Anemia 05/22/2014  . Blood transfusion   . Diabetes mellitus    diagnosed 1996-insulin and metformin  . Endometriosis   . Fibromyalgia   . GERD (gastroesophageal reflux disease)   . History of blood transfusion 12/09   [facility]  . Hyperlipidemia   . Hypertension   . Neuromuscular disorder (HCC)    fibromyalgia  . Seasonal allergies    recent bronchitis-chest xray/ct scan-granuloma noted present since 2008  . Sinusitis   . Splenomegaly 05/22/2014    Past Surgical History:  Procedure Laterality Date  . ABDOMINAL SURGERY     LOA for endomeriosis  . APPENDECTOMY  05/1995  . CESAREAN SECTION    . CHOLECYSTECTOMY  01/1995  . COLONOSCOPY    . lysis of adhesions  1996/ 2002  . TONSILLECTOMY  09/1994    There were no vitals filed for this visit.  Subjective Assessment - 03/23/18 0867    Subjective  Patient continues to have 3/10 pain in ankle  and now her left knee is hurting after hurting it last night with a twist getting out of bed 2/10; and is continues to need to wear the boot due to her ankle rolling laterally without support.     Patient is accompained by:  Family member    Pertinent History  Patient twised her left ankle  june 7th. She was able to work but then it began getting worse. She is wearing a boot on left foot and she began wearing it on june 17th.  She has been taken out of work until July 9th . 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     How long can you stand comfortably?  with the boot she can stand for 30 mins but it swells    How long can you walk comfortably?  She can walk 10 mins slow and easy with boot    Patient Stated Goals  to get back to work 12 hour shifts and walk required distances    Currently in Pain?  Yes    Pain Score  3     Pain Location  Ankle    Pain Orientation  Left    Pain Descriptors / Indicators  Aching    Pain Type  Chronic pain    Pain Onset  More than a month ago    Pain Frequency  Constant    Aggravating Factors   walking    Pain Relieving Factors  rest and wearing the boot    Effect of Pain on Daily Activities  unable to stand for longer  than 2 hours and then needs to rest it    Multiple Pain Sites  -- right knee pain 2/10        Treatment: Standing ankle heel cord stretch in pro stretch  x 30 sec x 3 Standing Baps board with lowest level DF, PF, INV/EVE AROM left ankle DF, PF x 10x 2 sets PROM Left ankle inv/eve, DF/ PFx 10, DF PROM left ankle is 10 degrees, DF AROM is 5 degrees due to pain and weakness AAROM left ankle eversion x 5 with very limited , 5 degrees DF stretch x 30 sec x 3   Patient is wearing thebootbrace all day and is able to stand for severalhoursbefore needing a seated rest. Patient has new onset of right knee pain following a twist after getting up fast out of bed last night. Her right knee began to hurt as she tried to stand to do her left ankle strengthening exercises.                         PT Education - 03/23/18 0824    Education Details  ice to right knee, HEP for left ankle    Person(s) Educated  Patient    Methods  Explanation;Demonstration;Tactile cues    Comprehension  Verbalized  understanding;Returned demonstration;Verbal cues required       PT Short Term Goals - 02/15/18 1350      PT SHORT TERM GOAL #1   Title  Patient will be independent in home exercise program to improve strength/mobility for better functional independence with ADLs.    Time  4    Period  Weeks    Status  New    Target Date  03/15/18      PT SHORT TERM GOAL #2   Title   Patient (< 54 years old) will complete five times sit to stand test in < 10 seconds indicating an increased LE strength and improved balance.    Time  4    Period  Weeks    Status  New    Target Date  03/15/18        PT Long Term Goals - 03/16/18 0850      PT LONG TERM GOAL #1   Title  Patient will increase six minute walk test distance to >1000 for progression to community ambulator and improve gait ability    Time  8    Period  Weeks    Status  Partially Met    Target Date  04/12/18      PT LONG TERM GOAL #2   Title  Patient will increase 10 meter walk test to >1.82ms as to improve gait speed for better community ambulation and to reduce fall risk.    Time  8    Period  Weeks    Status  Partially Met    Target Date  04/12/18      PT LONG TERM GOAL #3   Title  Patient will report a worst pain of 3/10 on VAS in    left ankle     to improve tolerance with ADLs and reduced symptoms with activities.     Baseline  2/10     Time  8    Period  Weeks    Status  Partially Met    Target Date  04/12/18      PT LONG TERM GOAL #4   Title  patient will be able to tolerate standing without the  boot for 6-8 hours to be able to go back to work    Baseline  2 hours standing    Time  8    Period  Weeks    Status  Partially Met    Target Date  04/12/18            Plan - 03/23/18 0825    Clinical Impression Statement  Patient continues to have decreased AROM, PROM to left ankle musculature iwth pain in AROM and PROM to left ankle. She has decreased gait speed and ambulates in boot for better comfort. She has  poor strength to left andkle and motion is greatly reduced. Patiet will oontine to improve and benefti from skilled PT to improve mobility and strength to left ankle    Rehab Potential  Good    PT Frequency  2x / week    PT Duration  8 weeks    PT Treatment/Interventions  Passive range of motion;Patient/family education;Therapeutic exercise;Therapeutic activities;Moist Heat;Electrical Stimulation;Cryotherapy;Ultrasound    PT Next Visit Plan  HEP review and progression of exercises    Consulted and Agree with Plan of Care  Patient       Patient will benefit from skilled therapeutic intervention in order to improve the following deficits and impairments:  Abnormal gait, Decreased balance, Difficulty walking, Decreased activity tolerance, Decreased strength, Impaired flexibility, Pain, Decreased range of motion  Visit Diagnosis: Difficulty in walking, not elsewhere classified  Pain in left ankle and joints of left foot  Muscle weakness (generalized)     Problem List Patient Active Problem List   Diagnosis Date Noted  . Splenomegaly 05/22/2014  . Anemia 05/22/2014  . Obesity (BMI 30-39.9) 02/26/2014  . Diabetes ([place]) 11/15/2012  . Abdominal pain, right side. 10/23/2011    Alanson Puls, PT DPT 03/23/2018, 8:29 AM  [place] MAIN [facility] SERVICES [address] [place], [place], [address] Phone: [phone]   Fax:  [phone]  Name: Alexis Campos MRN: [id number] Date of Birth: 1965-10-06

## 2018-03-24 DIAGNOSIS — M25572 Pain in left ankle and joints of left foot: Secondary | ICD-10-CM | POA: Diagnosis not present

## 2018-03-30 ENCOUNTER — Ambulatory Visit: Payer: 59

## 2018-03-30 DIAGNOSIS — M25572 Pain in left ankle and joints of left foot: Secondary | ICD-10-CM | POA: Diagnosis not present

## 2018-04-04 ENCOUNTER — Encounter: Payer: Self-pay | Admitting: Physical Therapy

## 2018-04-04 ENCOUNTER — Ambulatory Visit: Payer: 59 | Admitting: Physical Therapy

## 2018-04-04 DIAGNOSIS — R262 Difficulty in walking, not elsewhere classified: Secondary | ICD-10-CM

## 2018-04-04 DIAGNOSIS — M25572 Pain in left ankle and joints of left foot: Secondary | ICD-10-CM

## 2018-04-04 DIAGNOSIS — M6281 Muscle weakness (generalized): Secondary | ICD-10-CM

## 2018-04-04 MED FILL — METOPROLOL TARTRATE 50 MG T: 50 | 90 days supply | Qty: 540 | Fill #3

## 2018-04-04 MED FILL — LEVOTHYROXINE 50 MCG TABLET: 50 | 30 days supply | Qty: 30 | Fill #3

## 2018-04-04 NOTE — Therapy (Signed)
[place] MAIN [facility] SERVICES [address] [place], [place], [address] Phone: [phone]   Fax:  [phone]  Physical Therapy Treatment  Patient Details  Name: Alexis Campos MRN: [id number] Date of Birth: 03-11-1966 Referring Provider: Earlie Server   Encounter Date: 04/04/2018  PT End of Session - 04/04/18 1700    Visit Number  12    Number of Visits  17    Date for PT Re-Evaluation  04/12/18    PT Start Time  0445    PT Stop Time  0525    PT Time Calculation (min)  40 min    Equipment Utilized During Treatment  Gait belt    Activity Tolerance  Patient tolerated treatment well    Behavior During Therapy  [facility] for tasks assessed/performed       Past Medical History:  Diagnosis Date  . Anemia   . Anemia 05/22/2014  . Blood transfusion   . Diabetes mellitus    diagnosed 1996-insulin and metformin  . Endometriosis   . Fibromyalgia   . GERD (gastroesophageal reflux disease)   . History of blood transfusion 12/09   [facility]  . Hyperlipidemia   . Hypertension   . Neuromuscular disorder (HCC)    fibromyalgia  . Seasonal allergies    recent bronchitis-chest xray/ct scan-granuloma noted present since 2008  . Sinusitis   . Splenomegaly 05/22/2014    Past Surgical History:  Procedure Laterality Date  . ABDOMINAL SURGERY     LOA for endomeriosis  . APPENDECTOMY  05/1995  . CESAREAN SECTION    . CHOLECYSTECTOMY  01/1995  . COLONOSCOPY    . lysis of adhesions  1996/ 2002  . TONSILLECTOMY  09/1994    There were no vitals filed for this visit.  Subjective Assessment - 04/04/18 1658    Subjective  Patient continues to have 410 pain in L  ankle    Patient is accompained by:  Family member    Pertinent History  Patient twised her left ankle june 7th. She was able to work but then it began getting worse. She is wearing a boot on left foot and she began wearing it on june 17th.  She has been taken out of work until July 9th . 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     How long can you stand comfortably?  with the boot she can stand for 30 mins but it swells    How long can you walk comfortably?  She can walk 10 mins slow and easy with boot    Patient Stated Goals  to get back to work 12 hour shifts and walk required distances    Currently in Pain?  Yes    Pain Score  4     Pain Location  Ankle    Pain Orientation  Left    Pain Descriptors / Indicators  Aching    Pain Onset  More than a month ago    Multiple Pain Sites  No       Treatment: Standing ankle heel cord stretch in pro stretch  x 30 sec x 3 Standing Baps board with lowest level DF, PF, INV/EVE AROM left ankle DF, PF x 10x 2 sets PROM Left ankle inv/eve, DF/ PFx 10, DF PROM left ankle is 10 degrees, DF AROM is 5 degrees due to pain and weakness AAROM left ankle eversion x 5 with very limited , 5 degrees DF stretch x 30  sec x 3   Patient is wearing the soft bootbrace all day and is able to stand for severalhoursbefore needing a seated rest, she wears the larger boot if she goes out of the house for outdoor ambulation and for more support.                        PT Education - 04/04/18 1700    Education Details  HEP    Person(s) Educated  Patient    Methods  Explanation;Demonstration;Tactile cues    Comprehension  Verbalized understanding       PT Short Term Goals - 02/15/18 1350      PT SHORT TERM GOAL #1   Title  Patient will be independent in home exercise program to improve strength/mobility for better functional independence with ADLs.    Time  4    Period  Weeks    Status  New    Target Date  03/15/18      PT SHORT TERM GOAL #2   Title   Patient (< 52 years old) will complete five times sit to stand test in < 10 seconds indicating an increased LE strength and improved balance.    Time  4    Period  Weeks    Status  New    Target Date  03/15/18        PT Long Term Goals - 03/16/18 0850       PT LONG TERM GOAL #1   Title  Patient will increase six minute walk test distance to >1000 for progression to community ambulator and improve gait ability    Time  8    Period  Weeks    Status  Partially Met    Target Date  04/12/18      PT LONG TERM GOAL #2   Title  Patient will increase 10 meter walk test to >1.6ms as to improve gait speed for better community ambulation and to reduce fall risk.    Time  8    Period  Weeks    Status  Partially Met    Target Date  04/12/18      PT LONG TERM GOAL #3   Title  Patient will report a worst pain of 3/10 on VAS in    left ankle     to improve tolerance with ADLs and reduced symptoms with activities.     Baseline  2/10     Time  8    Period  Weeks    Status  Partially Met    Target Date  04/12/18      PT LONG TERM GOAL #4   Title  patient will be able to tolerate standing without the boot for 6-8 hours to be able to go back to work    Baseline  2 hours standing    Time  8    Period  Weeks    Status  Partially Met    Target Date  04/12/18            Plan - 04/04/18 1701    Clinical Impression Statement  Patient continues to have decreased AROM, PROM to left ankle musculature with pain in AROM and PROM to left ankle. She is wearing her soft boot 3-4 hours while at home and needs to wear her larger boot if she ambulates outdoors.   She has decreased gait speed and ambulates in boot for better comfort. She has poor+ strength to  left ankle and motion is greatly reduced. Patient will contine to improve and benefit from skilled PT to improve mobility and strength to left ankle , increase standing tolerance and return to work    Rehab Potential  Good    PT Frequency  2x / week    PT Duration  8 weeks    PT Treatment/Interventions  Passive range of motion;Patient/family education;Therapeutic exercise;Therapeutic activities;Moist Heat;Electrical Stimulation;Cryotherapy;Ultrasound    PT Next Visit Plan  HEP review and progression of  exercises    Consulted and Agree with Plan of Care  Patient       Patient will benefit from skilled therapeutic intervention in order to improve the following deficits and impairments:  Abnormal gait, Decreased balance, Difficulty walking, Decreased activity tolerance, Decreased strength, Impaired flexibility, Pain, Decreased range of motion  Visit Diagnosis: Difficulty in walking, not elsewhere classified  Pain in left ankle and joints of left foot  Muscle weakness (generalized)     Problem List Patient Active Problem List   Diagnosis Date Noted  . Splenomegaly 05/22/2014  . Anemia 05/22/2014  . Obesity (BMI 30-39.9) 02/26/2014  . Diabetes ([place]) 11/15/2012  . Abdominal pain, right side. 10/23/2011    Alanson Puls, PT DPT 04/04/2018, 5:47 PM  [place] MAIN [facility] SERVICES [address] [place], [place], [address] Phone: [phone]   Fax:  [phone]  Name: KYNDELL ZEISER MRN: [id number] Date of Birth: [birth year]

## 2018-04-05 DIAGNOSIS — M25572 Pain in left ankle and joints of left foot: Secondary | ICD-10-CM | POA: Diagnosis not present

## 2018-04-05 MED FILL — MELOXICAM 15 MG TABLET: 15 | 30 days supply | Qty: 30 | Fill #0

## 2018-04-06 ENCOUNTER — Encounter: Payer: Self-pay | Admitting: Physical Therapy

## 2018-04-06 ENCOUNTER — Ambulatory Visit: Payer: 59 | Admitting: Physical Therapy

## 2018-04-06 DIAGNOSIS — M6281 Muscle weakness (generalized): Secondary | ICD-10-CM

## 2018-04-06 DIAGNOSIS — R262 Difficulty in walking, not elsewhere classified: Secondary | ICD-10-CM | POA: Diagnosis not present

## 2018-04-06 DIAGNOSIS — M25572 Pain in left ankle and joints of left foot: Secondary | ICD-10-CM | POA: Diagnosis not present

## 2018-04-06 NOTE — Therapy (Addendum)
[place] MAIN [facility] SERVICES [address] [place], [place], [address] Phone: [phone]   Fax:  [phone]  Physical Therapy Treatment  Patient Details  Name: Alexis Campos MRN: [id number] Date of Birth: [birth year] Referring Provider: Earlie Server   Encounter Date: 04/06/2018  PT End of Session - 04/06/18 1654    Visit Number  13    Number of Visits  17    Date for PT Re-Evaluation  04/12/18    PT Start Time  0445    PT Stop Time  0525    PT Time Calculation (min)  40 min    Equipment Utilized During Treatment  Gait belt    Activity Tolerance  Patient tolerated treatment well    Behavior During Therapy  [facility] for tasks assessed/performed       Past Medical History:  Diagnosis Date  . Anemia   . Anemia 05/22/2014  . Blood transfusion   . Diabetes mellitus    diagnosed 1996-insulin and metformin  . Endometriosis   . Fibromyalgia   . GERD (gastroesophageal reflux disease)   . History of blood transfusion 12/09   [facility]  . Hyperlipidemia   . Hypertension   . Neuromuscular disorder (HCC)    fibromyalgia  . Seasonal allergies    recent bronchitis-chest xray/ct scan-granuloma noted present since 2008  . Sinusitis   . Splenomegaly 05/22/2014    Past Surgical History:  Procedure Laterality Date  . ABDOMINAL SURGERY     LOA for endomeriosis  . APPENDECTOMY  05/1995  . CESAREAN SECTION    . CHOLECYSTECTOMY  01/1995  . COLONOSCOPY    . lysis of adhesions  1996/ 2002  . TONSILLECTOMY  09/1994    There were no vitals filed for this visit.  Subjective Assessment - 04/06/18 1651    Subjective  Patient continues to have 410 pain in L  ankle    Patient is accompained by:  Family member    Pertinent History  Patient twised her left ankle june 7th. She was able to work but then it began getting worse. She is wearing a boot on left foot and she began wearing it on june 17th.  She has been taken out of work until July 9th . 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     How long can you stand comfortably?  with the boot she can stand for 30 mins but it swells    How long can you walk comfortably?  She can walk 10 mins slow and easy with boot    Patient Stated Goals  to get back to work 12 hour shifts and walk required distances    Currently in Pain?  Yes    Pain Score  4     Pain Location  Ankle    Pain Orientation  Left    Pain Descriptors / Indicators  Aching;Burning    Pain Type  Chronic pain    Pain Onset  More than a month ago    Multiple Pain Sites  No       Treatment: Standing ankle heel cord stretchin pro stretchx 30 sec x 3 Standing Baps board withlowestlevel DF, PF, INV/EVE AROM left ankle DF, PF x 10x 2 sets PROM Left ankle inv/eve, DF/ PFx 10, DF PROM left ankle is 10 degrees, DF AROM is 5 degrees due to pain and weakness AAROM left ankle eversion x 5 with very limited , 5 degrees DF  stretch x 30 sec x 3 with Ionto with dexamethosone 1 ML to left lateral peroneal  muscle posterior to the fibula head and posterior to lateral malleolus  Patient is wearing the soft bootbrace all day and is able to stand for severalhoursbefore needing a seated rest, she wears the larger boot if she goes out of the house for outdoor ambulation and for more support                         PT Education - 04/06/18 1653    Education Details  HEP    Person(s) Educated  Patient    Methods  Explanation;Demonstration;Tactile cues    Comprehension  Verbalized understanding       PT Short Term Goals - 02/15/18 1350      PT SHORT TERM GOAL #1   Title  Patient will be independent in home exercise program to improve strength/mobility for better functional independence with ADLs.    Time  4    Period  Weeks    Status  New    Target Date  03/15/18      PT SHORT TERM GOAL #2   Title   Patient (< 67 years old) will complete five times sit to stand test in < 10 seconds indicating  an increased LE strength and improved balance.    Time  4    Period  Weeks    Status  New    Target Date  03/15/18        PT Long Term Goals - 03/16/18 0850      PT LONG TERM GOAL #1   Title  Patient will increase six minute walk test distance to >1000 for progression to community ambulator and improve gait ability    Time  8    Period  Weeks    Status  Partially Met    Target Date  04/12/18      PT LONG TERM GOAL #2   Title  Patient will increase 10 meter walk test to >1.32ms as to improve gait speed for better community ambulation and to reduce fall risk.    Time  8    Period  Weeks    Status  Partially Met    Target Date  04/12/18      PT LONG TERM GOAL #3   Title  Patient will report a worst pain of 3/10 on VAS in    left ankle     to improve tolerance with ADLs and reduced symptoms with activities.     Baseline  2/10     Time  8    Period  Weeks    Status  Partially Met    Target Date  04/12/18      PT LONG TERM GOAL #4   Title  patient will be able to tolerate standing without the boot for 6-8 hours to be able to go back to work    Baseline  2 hours standing    Time  8    Period  Weeks    Status  Partially Met    Target Date  04/12/18            Plan - 04/06/18 1655    Clinical Impression Statement  Patient continues to have decreased AROM, PROM to left ankle musculature Patient is able to tolerate ionto with dexamethasone. to lateral left ankle.   She has decreased gait speed and ambulates in boot for better  comfort. She has poor strength to left andkle and motion is greatly reduced. Patient will oontine to improve and benefit from skilled PT to improve mobility and strength to left ankle , increase standing tolerance and return to work    Rehab Potential  Good    PT Frequency  2x / week    PT Duration  8 weeks    PT Treatment/Interventions  Passive range of motion;Patient/family education;Therapeutic exercise;Therapeutic activities;Moist Heat;Electrical  Stimulation;Cryotherapy;Ultrasound    PT Next Visit Plan  HEP review and progression of exercises    Consulted and Agree with Plan of Care  Patient       Patient will benefit from skilled therapeutic intervention in order to improve the following deficits and impairments:  Abnormal gait, Decreased balance, Difficulty walking, Decreased activity tolerance, Decreased strength, Impaired flexibility, Pain, Decreased range of motion  Visit Diagnosis: Difficulty in walking, not elsewhere classified  Pain in left ankle and joints of left foot  Muscle weakness (generalized)     Problem List Patient Active Problem List   Diagnosis Date Noted  . Splenomegaly 05/22/2014  . Anemia 05/22/2014  . Obesity (BMI 30-39.9) 02/26/2014  . Diabetes ([place]) 11/15/2012  . Abdominal pain, right side. 10/23/2011    Alanson Puls, PT DPT 04/06/2018, 4:58 PM  [place] MAIN [facility] SERVICES [address] [place], [place], [address] Phone: [phone]   Fax:  [phone]  Name: Alexis Campos MRN: [id number] Date of Birth: 1966/03/10

## 2018-04-06 NOTE — Addendum Note (Signed)
Addended by: Ezekiel InaMANSFIELD, KRISTINE S on: 04/06/2018 05:56 PM   Modules accepted: Orders

## 2018-04-07 DIAGNOSIS — H6980 Other specified disorders of Eustachian tube, unspecified ear: Secondary | ICD-10-CM | POA: Diagnosis not present

## 2018-04-07 DIAGNOSIS — R0981 Nasal congestion: Secondary | ICD-10-CM | POA: Diagnosis not present

## 2018-04-07 DIAGNOSIS — L719 Rosacea, unspecified: Secondary | ICD-10-CM | POA: Diagnosis not present

## 2018-04-07 MED FILL — FLUTICASONE PROP 50 MCG SPR: 50 | 30 days supply | Qty: 16 | Fill #0

## 2018-04-07 MED FILL — DOXYCYCLINE HYCLATE 100 MG: 100 | 80 days supply | Qty: 90 | Fill #0

## 2018-04-11 ENCOUNTER — Ambulatory Visit: Payer: 59 | Admitting: Physical Therapy

## 2018-04-11 ENCOUNTER — Encounter: Payer: Self-pay | Admitting: Physical Therapy

## 2018-04-11 DIAGNOSIS — M6281 Muscle weakness (generalized): Secondary | ICD-10-CM | POA: Diagnosis not present

## 2018-04-11 DIAGNOSIS — M25572 Pain in left ankle and joints of left foot: Secondary | ICD-10-CM | POA: Diagnosis not present

## 2018-04-11 DIAGNOSIS — R262 Difficulty in walking, not elsewhere classified: Secondary | ICD-10-CM | POA: Diagnosis not present

## 2018-04-11 NOTE — Therapy (Signed)
[place] MAIN [facility] SERVICES [address] [place], [place], [address] Phone: [phone]   Fax:  [phone]  Physical Therapy Treatment  Patient Details  Name: Alexis Campos MRN: [id number] Date of Birth: May 03, 1966 Referring Provider: Earlie Server   Encounter Date: 04/11/2018  PT End of Session - 04/11/18 0900    Visit Number  14    Number of Visits  17    Date for PT Re-Evaluation  04/12/18    PT Start Time  0810    PT Stop Time  0848    PT Time Calculation (min)  38 min    Equipment Utilized During Treatment  Gait belt    Activity Tolerance  Patient tolerated treatment well    Behavior During Therapy  [facility] for tasks assessed/performed       Past Medical History:  Diagnosis Date  . Anemia   . Anemia 05/22/2014  . Blood transfusion   . Diabetes mellitus    diagnosed 1996-insulin and metformin  . Endometriosis   . Fibromyalgia   . GERD (gastroesophageal reflux disease)   . History of blood transfusion 12/09   [facility]  . Hyperlipidemia   . Hypertension   . Neuromuscular disorder (HCC)    fibromyalgia  . Seasonal allergies    recent bronchitis-chest xray/ct scan-granuloma noted present since 2008  . Sinusitis   . Splenomegaly 05/22/2014    Past Surgical History:  Procedure Laterality Date  . ABDOMINAL SURGERY     LOA for endomeriosis  . APPENDECTOMY  05/1995  . CESAREAN SECTION    . CHOLECYSTECTOMY  01/1995  . COLONOSCOPY    . lysis of adhesions  1996/ 2002  . TONSILLECTOMY  09/1994    There were no vitals filed for this visit.  Subjective Assessment - 04/11/18 0857    Subjective  Patient continues to have 410 pain in L  ankle, she twisted her ankle while shopping at a store and hit her head.     Patient is accompained by:  Family member    Pertinent History  Patient twised her left ankle june 7th. She was able to work but then it began getting worse. She is wearing a boot on left foot and she began wearing it  on june 17th.  She has been taken out of work until July 9th . 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     How long can you stand comfortably?  with the boot she can stand for 30 mins but it swells    How long can you walk comfortably?  She can walk 10 mins slow and easy with boot    Patient Stated Goals  to get back to work 12 hour shifts and walk required distances    Currently in Pain?  Yes    Pain Score  4     Pain Location  Ankle    Pain Orientation  Left    Pain Descriptors / Indicators  Aching    Pain Type  Chronic pain    Pain Onset  More than a month ago    Aggravating Factors   walking     Pain Relieving Factors  rest    Multiple Pain Sites  No        Treatment: Isometrics DF/PF/INV/EVE x 10sec x 5  Seated Baps board withlowestlevel DF, PF, INV/EVE AROM left ankle DF, PF x 10x 2 sets PROM Left ankle inv/eve, DF/ PFx 10, DF PROM  left ankle is 10 degrees, DF AROM is 5 degrees due to pain and weakness AAROM left ankle eversion x 5 with very limited , 5 degrees DF stretch x 30 sec x 3 with Ionto with dexamethosone 1 ML to left lateral peroneal  muscle posterior to the fibula head and posterior to lateral malleolus  Patient is wearing thesoftbootbrace all day  but while wearing it she twisted her foot and feel fwd and hit her head. She is now back to needing to wear her larger boot.                        PT Education - 04/11/18 0859    Education Details  HEP    Person(s) Educated  Patient    Methods  Explanation    Comprehension  Verbalized understanding       PT Short Term Goals - 02/15/18 1350      PT SHORT TERM GOAL #1   Title  Patient will be independent in home exercise program to improve strength/mobility for better functional independence with ADLs.    Time  4    Period  Weeks    Status  New    Target Date  03/15/18      PT SHORT TERM GOAL #2   Title   Patient (< 12 years old) will complete five times sit to stand  test in < 10 seconds indicating an increased LE strength and improved balance.    Time  4    Period  Weeks    Status  New    Target Date  03/15/18        PT Long Term Goals - 04/11/18 0902      PT LONG TERM GOAL #1   Title  Patient will increase six minute walk test distance to >1000 for progression to community ambulator and improve gait ability    Baseline  NT    Time  8    Period  Weeks    Status  Partially Met    Target Date  06/06/18      PT LONG TERM GOAL #2   Title  Patient will increase 10 meter walk test to >1.2ms as to improve gait speed for better community ambulation and to reduce fall risk.    Baseline  NT    Time  8    Period  Weeks    Status  Partially Met    Target Date  06/06/18      PT LONG TERM GOAL #3   Title  Patient will report a worst pain of 3/10 on VAS in    left ankle     to improve tolerance with ADLs and reduced symptoms with activities.     Baseline  2/10 , 4/10 right ankle    Time  8    Period  Weeks    Status  Partially Met    Target Date  06/06/18      PT LONG TERM GOAL #4   Title  patient will be able to tolerate standing without the boot for 6-8 hours to be able to go back to work    Baseline  2 hours standing, 4 hours standing    Time  8    Period  Weeks    Status  Partially Met    Target Date  06/06/18            Plan - 04/11/18 0901    Clinical  Impression Statement  Patient continues to have decreased AROM, PROM to left ankle musculature iwth pain in AROM and PROM to left ankle. She has decreased gait speed and ambulates in boot for better comfort.due to her twisting her left ankle during shopping and hit her head resulting in a large bruise on her forehead.  She has poor strength to left andkle and motion is greatly reduced. Patiet will oontine to improve and benefti from skilled PT to improve mobility and strength to left ankle    Rehab Potential  Good    PT Frequency  2x / week    PT Duration  8 weeks    PT  Treatment/Interventions  Passive range of motion;Patient/family education;Therapeutic exercise;Therapeutic activities;Moist Heat;Electrical Stimulation;Cryotherapy;Ultrasound;Iontophoresis 4mg/ml Dexamethasone    PT Next Visit Plan  HEP review and progression of exercises    Consulted and Agree with Plan of Care  Patient       Patient will benefit from skilled therapeutic intervention in order to improve the following deficits and impairments:  Abnormal gait, Decreased balance, Difficulty walking, Decreased activity tolerance, Decreased strength, Impaired flexibility, Pain, Decreased range of motion  Visit Diagnosis: Difficulty in walking, not elsewhere classified  Pain in left ankle and joints of left foot  Muscle weakness (generalized)     Problem List Patient Active Problem List   Diagnosis Date Noted  . Splenomegaly 05/22/2014  . Anemia 05/22/2014  . Obesity (BMI 30-39.9) 02/26/2014  . Diabetes (HCC) 11/15/2012  . Abdominal pain, right side. 10/23/2011    Mansfield, Kristine S, PT DPT 04/11/2018, 9:12 AM  [facility] MAIN REHAB SERVICES [address] [place], [place], [address] Phone: [phone]   Fax:  [phone]  Name: Alexis Campos MRN: [id number] Date of Birth: 12/10/1965   

## 2018-04-13 ENCOUNTER — Ambulatory Visit: Payer: 59 | Admitting: Physical Therapy

## 2018-04-19 DIAGNOSIS — M25572 Pain in left ankle and joints of left foot: Secondary | ICD-10-CM | POA: Diagnosis not present

## 2018-04-20 ENCOUNTER — Ambulatory Visit: Payer: 59 | Attending: Orthopedic Surgery

## 2018-04-20 DIAGNOSIS — M6281 Muscle weakness (generalized): Secondary | ICD-10-CM | POA: Insufficient documentation

## 2018-04-20 DIAGNOSIS — R262 Difficulty in walking, not elsewhere classified: Secondary | ICD-10-CM | POA: Insufficient documentation

## 2018-04-20 DIAGNOSIS — M25572 Pain in left ankle and joints of left foot: Secondary | ICD-10-CM | POA: Diagnosis not present

## 2018-04-20 NOTE — Therapy (Signed)
[place] MAIN [facility] SERVICES [address] [place], [place], [address] Phone: [phone]   Fax:  [phone]  Physical Therapy Treatment  Patient Details  Name: Alexis Campos MRN: [id number] Date of Birth: November 30, 1965 Referring Provider: Earlie Server   Encounter Date: 04/20/2018  PT End of Session - 04/20/18 0937    Visit Number  15    Number of Visits  17    Date for PT Re-Evaluation  06/06/18    PT Start Time  0801    PT Stop Time  0845    PT Time Calculation (min)  44 min    Equipment Utilized During Treatment  Gait belt    Activity Tolerance  Patient tolerated treatment well    Behavior During Therapy  [facility] for tasks assessed/performed       Past Medical History:  Diagnosis Date  . Anemia   . Anemia 05/22/2014  . Blood transfusion   . Diabetes mellitus    diagnosed 1996-insulin and metformin  . Endometriosis   . Fibromyalgia   . GERD (gastroesophageal reflux disease)   . History of blood transfusion 12/09   [facility]  . Hyperlipidemia   . Hypertension   . Neuromuscular disorder (HCC)    fibromyalgia  . Seasonal allergies    recent bronchitis-chest xray/ct scan-granuloma noted present since 2008  . Sinusitis   . Splenomegaly 05/22/2014    Past Surgical History:  Procedure Laterality Date  . ABDOMINAL SURGERY     LOA for endomeriosis  . APPENDECTOMY  05/1995  . CESAREAN SECTION    . CHOLECYSTECTOMY  01/1995  . COLONOSCOPY    . lysis of adhesions  1996/ 2002  . TONSILLECTOMY  09/1994    There were no vitals filed for this visit.  Subjective Assessment - 04/20/18 0801    Subjective  Patient denies pain upon arrival. HEP is going well. No specific questions or concerns at this time. She goes back to work this upcoming weekend.    Patient is accompained by:  Family member    Pertinent History  Patient twised her left ankle june 7th. She was able to work but then it began getting worse. She is wearing a boot on left  foot and she began wearing it on june 17th.  She has been taken out of work until July 9th . 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     How long can you stand comfortably?  with the boot she can stand for 30 mins but it swells    How long can you walk comfortably?  She can walk 10 mins slow and easy with boot    Patient Stated Goals  to get back to work 12 hour shifts and walk required distances    Currently in Pain?  No/denies         TREATMENT  Manual Therapy  Gentle L ankle stretches for DF/PF/INV/EVE 20s hold x 3 bouts in each direction; L ankle talocrural (TC) AP mobs at end range dorsiflexion grade I, 30s/bout x 3 bouts; L ankle TC distraction mobs grade II, 30s/bout x 3 bouts; L ankle subtalar distraction mobs, grade II, 30s/bout x 3 bouts;  Ther-ex  L ankle open chain AROM in all planes x multiple bouts in each direction; L ankle ABCs x 1 bout; Seated Baps board withlowestlevel DF/PF x 10, INV/EVE x 10, circles CW/CCW x 10 each; L ankle isometrics DF/PF/INV/EVE 5s hold x 5 each direction;  Iontophoresis patch applied to L ankle at end of session with 58m of dexamethosoneover peroneus longus and brevis tendons posterior to the lateral malleolus. Education provided to patient (Lysle Morales;                      PT Education - 04/20/18 0804    Education Details  exercise form/technique    Person(s) Educated  Patient    Methods  Explanation    Comprehension  Verbalized understanding       PT Short Term Goals - 02/15/18 1350      PT SHORT TERM GOAL #1   Title  Patient will be independent in home exercise program to improve strength/mobility for better functional independence with ADLs.    Time  4    Period  Weeks    Status  New    Target Date  03/15/18      PT SHORT TERM GOAL #2   Title   Patient (< 89years old) will complete five times sit to stand test in < 10 seconds indicating an increased LE strength and improved balance.    Time   4    Period  Weeks    Status  New    Target Date  03/15/18        PT Long Term Goals - 04/11/18 0902      PT LONG TERM GOAL #1   Title  Patient will increase six minute walk test distance to >1000 for progression to community ambulator and improve gait ability    Baseline  NT    Time  8    Period  Weeks    Status  Partially Met    Target Date  06/06/18      PT LONG TERM GOAL #2   Title  Patient will increase 10 meter walk test to >1.016m as to improve gait speed for better community ambulation and to reduce fall risk.    Baseline  NT    Time  8    Period  Weeks    Status  Partially Met    Target Date  06/06/18      PT LONG TERM GOAL #3   Title  Patient will report a worst pain of 3/10 on VAS in    left ankle     to improve tolerance with ADLs and reduced symptoms with activities.     Baseline  2/10 , 4/10 right ankle    Time  8    Period  Weeks    Status  Partially Met    Target Date  06/06/18      PT LONG TERM GOAL #4   Title  patient will be able to tolerate standing without the boot for 6-8 hours to be able to go back to work    Baseline  2 hours standing, 4 hours standing    Time  8    Period  Weeks    Status  Partially Met    Target Date  06/06/18            Plan - 04/20/18 0939    Clinical Impression Statement  Patient continues to have decreased L ankle AROM/PROM as well as pain. Strength remains impaired as well. Pt reports continued swelling but this is improving. She demonstrates improved ankle PROM following stretching and gentle manual ankle mobilizations. Encouraged pt to use supportive brace when returning to work as well as ice/elevate as frequently as possible. Patiet will contine to  improve and benefti from skilled PT to improve mobility and strength to left ankle.    Clinical Presentation  Stable    Rehab Potential  Good    PT Frequency  2x / week    PT Duration  8 weeks    PT Treatment/Interventions  Passive range of motion;Patient/family  education;Therapeutic exercise;Therapeutic activities;Moist Heat;Electrical Stimulation;Cryotherapy;Ultrasound;Iontophoresis '4mg'$ /ml Dexamethasone    PT Next Visit Plan  HEP review and progression of exercises    Consulted and Agree with Plan of Care  Patient       Patient will benefit from skilled therapeutic intervention in order to improve the following deficits and impairments:  Abnormal gait, Decreased balance, Difficulty walking, Decreased activity tolerance, Decreased strength, Impaired flexibility, Pain, Decreased range of motion  Visit Diagnosis: Difficulty in walking, not elsewhere classified  Pain in left ankle and joints of left foot  Muscle weakness (generalized)     Problem List Patient Active Problem List   Diagnosis Date Noted  . Splenomegaly 05/22/2014  . Anemia 05/22/2014  . Obesity (BMI 30-39.9) 02/26/2014  . Diabetes ([place]) 11/15/2012  . Abdominal pain, right side. 10/23/2011   Phillips Grout PT, DPT, GCS  Huprich,Jason 04/20/2018, 9:48 AM  [place] MAIN [facility] SERVICES [address] [place], [place], [address] Phone: [phone]   Fax:  [phone]  Name: Alexis Campos MRN: [id number] Date of Birth: Mar 31, 1966

## 2018-04-25 ENCOUNTER — Ambulatory Visit: Payer: 59 | Admitting: Physical Therapy

## 2018-04-25 ENCOUNTER — Encounter: Payer: 59 | Admitting: Physical Therapy

## 2018-04-26 MED FILL — LANTUS SOLOSTAR 100 UNITS/M: 100 | 85 days supply | Qty: 60 | Fill #1

## 2018-04-27 ENCOUNTER — Ambulatory Visit: Payer: 59 | Admitting: Physical Therapy

## 2018-04-27 ENCOUNTER — Encounter: Payer: Self-pay | Admitting: Physical Therapy

## 2018-04-27 DIAGNOSIS — M25572 Pain in left ankle and joints of left foot: Secondary | ICD-10-CM

## 2018-04-27 DIAGNOSIS — R262 Difficulty in walking, not elsewhere classified: Secondary | ICD-10-CM

## 2018-04-27 DIAGNOSIS — M6281 Muscle weakness (generalized): Secondary | ICD-10-CM

## 2018-04-27 NOTE — Therapy (Signed)
[place] MAIN [facility] SERVICES [address] [place], [place], [address] Phone: [phone]   Fax:  [phone]  Physical Therapy Treatment  Patient Details  Name: Alexis Campos MRN: [id number] Date of Birth: 1966-05-16 Referring Provider: Earlie Server   Encounter Date: 04/27/2018  PT End of Session - 04/27/18 0902    Visit Number  16    Number of Visits  17    Date for PT Re-Evaluation  06/06/18    PT Start Time  0845    PT Stop Time  0930    PT Time Calculation (min)  45 min    Equipment Utilized During Treatment  Gait belt    Activity Tolerance  Patient tolerated treatment well    Behavior During Therapy  [facility] for tasks assessed/performed       Past Medical History:  Diagnosis Date  . Anemia   . Anemia 05/22/2014  . Blood transfusion   . Diabetes mellitus    diagnosed 1996-insulin and metformin  . Endometriosis   . Fibromyalgia   . GERD (gastroesophageal reflux disease)   . History of blood transfusion 12/09   [facility]  . Hyperlipidemia   . Hypertension   . Neuromuscular disorder (HCC)    fibromyalgia  . Seasonal allergies    recent bronchitis-chest xray/ct scan-granuloma noted present since 2008  . Sinusitis   . Splenomegaly 05/22/2014    Past Surgical History:  Procedure Laterality Date  . ABDOMINAL SURGERY     LOA for endomeriosis  . APPENDECTOMY  05/1995  . CESAREAN SECTION    . CHOLECYSTECTOMY  01/1995  . COLONOSCOPY    . lysis of adhesions  1996/ 2002  . TONSILLECTOMY  09/1994    There were no vitals filed for this visit.  Subjective Assessment - 04/27/18 0859    Subjective  Patient is having much less pain to left ankle 1/10 .     Patient is accompained by:  Family member    Pertinent History  Patient twised her left ankle june 7th. She was able to work but then it began getting worse. She is wearing a boot on left foot and she began wearing it on june 17th.  She has been taken out of work until July 9th  . 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     How long can you stand comfortably?  with the boot she can stand for 30 mins but it swells    How long can you walk comfortably?  She can walk 10 mins slow and easy with boot    Patient Stated Goals  to get back to work 12 hour shifts and walk required distances    Currently in Pain?  Yes    Pain Score  1     Pain Location  Ankle    Pain Orientation  Left    Pain Descriptors / Indicators  Aching    Pain Onset  More than a month ago       Treatment: Toe scrunches sitting with wash cloth ABC ROM exercises Isometrics DF/PF/INV/EVE x 10sec x 5  Seated Baps board withlowestlevel DF, PF, INV/EVE Standing Baps board withlowestlevel DF, PF, INV/EVE AROM left ankle DF, PF x 10x 2 sets PROM Left ankle inv/eve, DF/ PFx 10, DF PROM left ankle is 10 degrees, DF AROM is 5 degrees due to pain and weakness AAROM left ankle eversion x 5 with very limited , 5 degrees DF stretch x  30 sec x 3with Ionto with dexamethosone1 ML toleftlateral peronealmuscle posterior to the fibula head and posterior to lateral malleolus  Patient reports 1/10 pain.                          PT Education - 04/27/18 0901    Education Details  HEP,     Person(s) Educated  Patient    Methods  Explanation;Demonstration;Tactile cues;Verbal cues    Comprehension  Verbalized understanding;Need further instruction       PT Short Term Goals - 02/15/18 1350      PT SHORT TERM GOAL #1   Title  Patient will be independent in home exercise program to improve strength/mobility for better functional independence with ADLs.    Time  4    Period  Weeks    Status  New    Target Date  03/15/18      PT SHORT TERM GOAL #2   Title   Patient (< 19 years old) will complete five times sit to stand test in < 10 seconds indicating an increased LE strength and improved balance.    Time  4    Period  Weeks    Status  New    Target Date  03/15/18         PT Long Term Goals - 04/11/18 0902      PT LONG TERM GOAL #1   Title  Patient will increase six minute walk test distance to >1000 for progression to community ambulator and improve gait ability    Baseline  NT    Time  8    Period  Weeks    Status  Partially Met    Target Date  06/06/18      PT LONG TERM GOAL #2   Title  Patient will increase 10 meter walk test to >1.8ms as to improve gait speed for better community ambulation and to reduce fall risk.    Baseline  NT    Time  8    Period  Weeks    Status  Partially Met    Target Date  06/06/18      PT LONG TERM GOAL #3   Title  Patient will report a worst pain of 3/10 on VAS in    left ankle     to improve tolerance with ADLs and reduced symptoms with activities.     Baseline  2/10 , 4/10 right ankle    Time  8    Period  Weeks    Status  Partially Met    Target Date  06/06/18      PT LONG TERM GOAL #4   Title  patient will be able to tolerate standing without the boot for 6-8 hours to be able to go back to work    Baseline  2 hours standing, 4 hours standing    Time  8    Period  Weeks    Status  Partially Met    Target Date  06/06/18            Plan - 04/27/18 0903    Clinical Impression Statement  Patient is improving with less pain, increased strength and increased ROM. She is able to perform AROM in sitting and standing. She is able to ambulate without brace for short distances. She was able to work 12 hour shifts but her left foot swelled and cut into her left calf. She will continue to benefit  from skilled PT to  improve strength and ROM.     Rehab Potential  Good    PT Frequency  2x / week    PT Duration  8 weeks    PT Treatment/Interventions  Passive range of motion;Patient/family education;Therapeutic exercise;Therapeutic activities;Moist Heat;Electrical Stimulation;Cryotherapy;Ultrasound;Iontophoresis 16m/ml Dexamethasone    PT Next Visit Plan  HEP review and progression of exercises     Consulted and Agree with Plan of Care  Patient       Patient will benefit from skilled therapeutic intervention in order to improve the following deficits and impairments:  Abnormal gait, Decreased balance, Difficulty walking, Decreased activity tolerance, Decreased strength, Impaired flexibility, Pain, Decreased range of motion  Visit Diagnosis: Difficulty in walking, not elsewhere classified  Pain in left ankle and joints of left foot  Muscle weakness (generalized)     Problem List Patient Active Problem List   Diagnosis Date Noted  . Splenomegaly 05/22/2014  . Anemia 05/22/2014  . Obesity (BMI 30-39.9) 02/26/2014  . Diabetes ([place] 11/15/2012  . Abdominal pain, right side. 10/23/2011    MAlanson Puls PT DPT 04/27/2018, 9:10 AM  [place] [facility] [address] [address]Phone: [phone]  Fax:  [phone] Name: Alexis TANGEMANMRN: [id number] of Birth: 405-22-1967

## 2018-05-02 ENCOUNTER — Ambulatory Visit: Payer: 59 | Admitting: Physical Therapy

## 2018-05-02 ENCOUNTER — Encounter: Payer: 59 | Admitting: Physical Therapy

## 2018-05-02 ENCOUNTER — Encounter: Payer: Self-pay | Admitting: Physical Therapy

## 2018-05-02 DIAGNOSIS — M25572 Pain in left ankle and joints of left foot: Secondary | ICD-10-CM | POA: Diagnosis not present

## 2018-05-02 DIAGNOSIS — M6281 Muscle weakness (generalized): Secondary | ICD-10-CM

## 2018-05-02 DIAGNOSIS — R262 Difficulty in walking, not elsewhere classified: Secondary | ICD-10-CM

## 2018-05-02 NOTE — Therapy (Signed)
[place] MAIN [facility] SERVICES [address] [place], [place], [address] Phone: [phone]   Fax:  [phone]  Physical Therapy Treatment  Patient Details  Name: Alexis Campos MRN: [id number] Date of Birth: 10-13-1965 Referring Provider: Earlie Server   Encounter Date: 05/02/2018  PT End of Session - 05/02/18 0920    Visit Number  17    Number of Visits  17    Date for PT Re-Evaluation  06/06/18    PT Start Time  0930    PT Stop Time  1015    PT Time Calculation (min)  45 min    Equipment Utilized During Treatment  Gait belt    Activity Tolerance  Patient tolerated treatment well    Behavior During Therapy  [facility] for tasks assessed/performed       Past Medical History:  Diagnosis Date  . Anemia   . Anemia 05/22/2014  . Blood transfusion   . Diabetes mellitus    diagnosed 1996-insulin and metformin  . Endometriosis   . Fibromyalgia   . GERD (gastroesophageal reflux disease)   . History of blood transfusion 12/09   [facility]  . Hyperlipidemia   . Hypertension   . Neuromuscular disorder (HCC)    fibromyalgia  . Seasonal allergies    recent bronchitis-chest xray/ct scan-granuloma noted present since 2008  . Sinusitis   . Splenomegaly 05/22/2014    Past Surgical History:  Procedure Laterality Date  . ABDOMINAL SURGERY     LOA for endomeriosis  . APPENDECTOMY  05/1995  . CESAREAN SECTION    . CHOLECYSTECTOMY  01/1995  . COLONOSCOPY    . lysis of adhesions  1996/ 2002  . TONSILLECTOMY  09/1994    There were no vitals filed for this visit.  Subjective Assessment - 05/02/18 0919    Subjective  Patient is having much less pain to left ankle 1/10 .     Patient is accompained by:  Family member    Pertinent History  Patient twised her left ankle june 7th. She was able to work but then it began getting worse. She is wearing a boot on left foot and she began wearing it on june 17th.  She has been taken out of work until July 9th  . 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     How long can you stand comfortably?  with the boot she can stand for 30 mins but it swells    How long can you walk comfortably?  She can walk 10 mins slow and easy with boot    Patient Stated Goals  to get back to work 12 hour shifts and walk required distances    Currently in Pain?  Yes    Pain Score  1     Pain Location  Ankle    Pain Orientation  Left    Pain Descriptors / Indicators  Aching    Pain Type  Chronic pain    Pain Onset  More than a month ago    Aggravating Factors   standing on it    Pain Relieving Factors  rest    Effect of Pain on Daily Activities  needs to rest more often    Multiple Pain Sites  No       Treatment: Toe scrunches sitting with wash cloth ABC ROM exercises Isometrics DF/PF/INV/EVE x 10sec x 5 SeatedBaps board withlowestlevel DF, PF, INV/EVE Standing Baps board withlowestlevel DF, PF, INV/EVE AROM  left ankle DF, PF x 10x 2 sets PROM Left ankle inv/eve, DF/ PFx 10, DF PROM left ankle is 10 degrees, DF AROM is 5 degrees due to pain and weakness AAROM left ankle eversion x 5 with very limited , 5 degrees DF stretch x 30 sec x 3with Ionto with dexamethosone1 ML toleftlateral peronealmuscle posterior to the fibula head and posterior to lateral malleolus  Patient reports 1/10 pain.                         PT Education - 05/02/18 0920    Education Details  HEP, plan of care    Person(s) Educated  Patient    Methods  Explanation;Demonstration;Verbal cues    Comprehension  Verbalized understanding;Returned demonstration;Verbal cues required       PT Short Term Goals - 02/15/18 1350      PT SHORT TERM GOAL #1   Title  Patient will be independent in home exercise program to improve strength/mobility for better functional independence with ADLs.    Time  4    Period  Weeks    Status  New    Target Date  03/15/18      PT SHORT TERM GOAL #2   Title    Patient (< 23 years old) will complete five times sit to stand test in < 10 seconds indicating an increased LE strength and improved balance.    Time  4    Period  Weeks    Status  New    Target Date  03/15/18        PT Long Term Goals - 04/11/18 0902      PT LONG TERM GOAL #1   Title  Patient will increase six minute walk test distance to >1000 for progression to community ambulator and improve gait ability    Baseline  NT    Time  8    Period  Weeks    Status  Partially Met    Target Date  06/06/18      PT LONG TERM GOAL #2   Title  Patient will increase 10 meter walk test to >1.51ms as to improve gait speed for better community ambulation and to reduce fall risk.    Baseline  NT    Time  8    Period  Weeks    Status  Partially Met    Target Date  06/06/18      PT LONG TERM GOAL #3   Title  Patient will report a worst pain of 3/10 on VAS in    left ankle     to improve tolerance with ADLs and reduced symptoms with activities.     Baseline  2/10 , 4/10 right ankle    Time  8    Period  Weeks    Status  Partially Met    Target Date  06/06/18      PT LONG TERM GOAL #4   Title  patient will be able to tolerate standing without the boot for 6-8 hours to be able to go back to work    Baseline  2 hours standing, 4 hours standing    Time  8    Period  Weeks    Status  Partially Met    Target Date  06/06/18            Plan - 05/02/18 02951   Clinical Impression Statement  Patient is improving with less pain, increased  strength and increased ROM. She is able to perform AROM in sitting and standing. She is able to ambulate without brace for short distances. She was able to work 12 hour shifts but her left foot swelled and cut into her left calf. She will continue to benefit from skilled PT to improve strength and ROM.    Rehab Potential  Good    PT Frequency  2x / week    PT Duration  8 weeks    PT Treatment/Interventions  Passive range of motion;Patient/family  education;Therapeutic exercise;Therapeutic activities;Moist Heat;Electrical Stimulation;Cryotherapy;Ultrasound;Iontophoresis 90m/ml Dexamethasone    PT Next Visit Plan  HEP review and progression of exercises    Consulted and Agree with Plan of Care  Patient       Patient will benefit from skilled therapeutic intervention in order to improve the following deficits and impairments:  Abnormal gait, Decreased balance, Difficulty walking, Decreased activity tolerance, Decreased strength, Impaired flexibility, Pain, Decreased range of motion  Visit Diagnosis: Difficulty in walking, not elsewhere classified  Pain in left ankle and joints of left foot  Muscle weakness (generalized)     Problem List Patient Active Problem List   Diagnosis Date Noted  . Splenomegaly 05/22/2014  . Anemia 05/22/2014  . Obesity (BMI 30-39.9) 02/26/2014  . Diabetes ([place] 11/15/2012  . Abdominal pain, right side. 10/23/2011    MAlanson Puls PT DPT 05/02/2018, 10:24 AM  [place] [facility] [address] [address]Phone: [phone]  Fax:  [phone] Name: Alexis PILLARDMRN: [id number] of Birth: 408-Nov-1967

## 2018-05-04 ENCOUNTER — Ambulatory Visit: Payer: 59 | Admitting: Physical Therapy

## 2018-05-06 MED FILL — LEVOTHYROXINE 50 MCG TABLET: 50 | 30 days supply | Qty: 30 | Fill #4

## 2018-05-09 ENCOUNTER — Ambulatory Visit: Payer: 59 | Admitting: Physical Therapy

## 2018-05-11 ENCOUNTER — Ambulatory Visit: Payer: 59 | Admitting: Physical Therapy

## 2018-05-12 DIAGNOSIS — J029 Acute pharyngitis, unspecified: Secondary | ICD-10-CM | POA: Diagnosis not present

## 2018-05-12 DIAGNOSIS — I10 Essential (primary) hypertension: Secondary | ICD-10-CM | POA: Diagnosis not present

## 2018-05-12 DIAGNOSIS — J302 Other seasonal allergic rhinitis: Secondary | ICD-10-CM | POA: Diagnosis not present

## 2018-05-12 DIAGNOSIS — E119 Type 2 diabetes mellitus without complications: Secondary | ICD-10-CM | POA: Diagnosis not present

## 2018-05-12 DIAGNOSIS — R05 Cough: Secondary | ICD-10-CM | POA: Diagnosis not present

## 2018-05-12 DIAGNOSIS — J0100 Acute maxillary sinusitis, unspecified: Secondary | ICD-10-CM | POA: Diagnosis not present

## 2018-05-12 MED FILL — AMOX-CLAV 875-125 MG TABLET: 875-125 | 7 days supply | Qty: 14 | Fill #0

## 2018-05-12 MED FILL — PROMETHAZINE 6.25 MG/5 ML S: 6.25 | 8 days supply | Qty: 345 | Fill #0

## 2018-05-17 ENCOUNTER — Ambulatory Visit: Payer: 59 | Attending: Orthopedic Surgery | Admitting: Physical Therapy

## 2018-05-19 ENCOUNTER — Ambulatory Visit: Payer: 59 | Admitting: Physical Therapy

## 2018-05-24 ENCOUNTER — Encounter: Payer: 59 | Admitting: Physical Therapy

## 2018-05-26 ENCOUNTER — Encounter: Payer: 59 | Admitting: Physical Therapy

## 2018-06-09 DIAGNOSIS — E119 Type 2 diabetes mellitus without complications: Secondary | ICD-10-CM | POA: Diagnosis not present

## 2018-06-09 DIAGNOSIS — E559 Vitamin D deficiency, unspecified: Secondary | ICD-10-CM | POA: Diagnosis not present

## 2018-06-09 DIAGNOSIS — E038 Other specified hypothyroidism: Secondary | ICD-10-CM | POA: Diagnosis not present

## 2018-06-09 DIAGNOSIS — E7800 Pure hypercholesterolemia, unspecified: Secondary | ICD-10-CM | POA: Diagnosis not present

## 2018-06-09 DIAGNOSIS — E538 Deficiency of other specified B group vitamins: Secondary | ICD-10-CM | POA: Diagnosis not present

## 2018-06-13 DIAGNOSIS — I10 Essential (primary) hypertension: Secondary | ICD-10-CM | POA: Diagnosis not present

## 2018-06-13 DIAGNOSIS — E7800 Pure hypercholesterolemia, unspecified: Secondary | ICD-10-CM | POA: Diagnosis not present

## 2018-06-13 DIAGNOSIS — E119 Type 2 diabetes mellitus without complications: Secondary | ICD-10-CM | POA: Diagnosis not present

## 2018-06-13 DIAGNOSIS — E559 Vitamin D deficiency, unspecified: Secondary | ICD-10-CM | POA: Diagnosis not present

## 2018-06-13 DIAGNOSIS — E038 Other specified hypothyroidism: Secondary | ICD-10-CM | POA: Diagnosis not present

## 2018-06-13 DIAGNOSIS — E538 Deficiency of other specified B group vitamins: Secondary | ICD-10-CM | POA: Diagnosis not present

## 2018-06-13 DIAGNOSIS — F329 Major depressive disorder, single episode, unspecified: Secondary | ICD-10-CM | POA: Diagnosis not present

## 2018-06-13 MED FILL — CITALOPRAM HBR 10 MG TABLET: 10 | 90 days supply | Qty: 90 | Fill #0

## 2018-06-13 MED FILL — VIT D2 1.25 MG (50,000 UNIT: 1.25 MG | 28 days supply | Qty: 8 | Fill #0

## 2018-06-20 MED FILL — FLUTICASONE PROP 50 MCG SPR: 50 | 30 days supply | Qty: 16 | Fill #1

## 2018-06-20 MED FILL — LEVOTHYROXINE 50 MCG TABLET: 50 | 30 days supply | Qty: 30 | Fill #5

## 2018-06-20 MED FILL — LISINOPRIL 2.5 MG TABLET: 2.5 | 90 days supply | Qty: 90 | Fill #2

## 2018-06-20 MED FILL — metFORMIN HCL 500 MG TABS: 500 | 90 days supply | Qty: 360 | Fill #2

## 2018-07-11 DIAGNOSIS — M549 Dorsalgia, unspecified: Secondary | ICD-10-CM | POA: Diagnosis not present

## 2018-07-11 DIAGNOSIS — N390 Urinary tract infection, site not specified: Secondary | ICD-10-CM | POA: Diagnosis not present

## 2018-07-11 DIAGNOSIS — R109 Unspecified abdominal pain: Secondary | ICD-10-CM | POA: Diagnosis not present

## 2018-07-11 DIAGNOSIS — I10 Essential (primary) hypertension: Secondary | ICD-10-CM | POA: Diagnosis not present

## 2018-07-12 MED FILL — ONDANSETRON HCL 4 MG TABLET: 4 | 14 days supply | Qty: 28 | Fill #0

## 2018-07-15 ENCOUNTER — Encounter: Payer: Self-pay | Admitting: Emergency Medicine

## 2018-07-15 ENCOUNTER — Ambulatory Visit
Admission: EM | Admit: 2018-07-15 | Discharge: 2018-07-15 | Disposition: A | Discharge status: Home / Self Care | Payer: 59 | Attending: Family Medicine | Admitting: Family Medicine

## 2018-07-15 ENCOUNTER — Ambulatory Visit (INDEPENDENT_AMBULATORY_CARE_PROVIDER_SITE_OTHER)
Admit: 2018-07-15 | Discharge: 2018-07-15 | Disposition: A | Discharge status: Home / Self Care | Payer: 59 | Attending: Family Medicine | Admitting: Family Medicine

## 2018-07-15 ENCOUNTER — Other Ambulatory Visit: Payer: Self-pay

## 2018-07-15 DIAGNOSIS — N200 Calculus of kidney: Secondary | ICD-10-CM | POA: Diagnosis not present

## 2018-07-15 DIAGNOSIS — R739 Hyperglycemia, unspecified: Secondary | ICD-10-CM

## 2018-07-15 DIAGNOSIS — R110 Nausea: Secondary | ICD-10-CM

## 2018-07-15 LAB — URINALYSIS, COMPLETE (UACMP) WITH MICROSCOPIC
BACTERIA UA: NONE SEEN
Bilirubin Urine: NEGATIVE
Glucose, UA: >1000 mg/dL — AB
Hgb urine dipstick: NEGATIVE
LEUKOCYTES UA: NEGATIVE
Nitrite: NEGATIVE
PH: 5.5 (ref 5.0–8.0)
Protein, ur: NEGATIVE mg/dL
RBC / HPF: NONE SEEN RBC/hpf (ref 0–5)
SPECIFIC GRAVITY, URINE: 1.015 (ref 1.005–1.030)

## 2018-07-15 LAB — CBC WITH DIFFERENTIAL/PLATELET
Abs Immature Granulocytes: 0.06 10*3/uL (ref 0.00–0.07)
Basophils Absolute: 0 10*3/uL (ref 0.0–0.1)
Basophils Relative: 1 %
EOS PCT: 1 %
Eosinophils Absolute: 0.1 10*3/uL (ref 0.0–0.5)
HEMATOCRIT: 35.3 % — AB (ref 36.0–46.0)
HEMOGLOBIN: 12.2 g/dL (ref 12.0–15.0)
Immature Granulocytes: 1 %
LYMPHS ABS: 1 10*3/uL (ref 0.7–4.0)
LYMPHS PCT: 17 %
MCH: 31 pg (ref 26.0–34.0)
MCHC: 34.6 g/dL (ref 30.0–36.0)
MCV: 89.8 fL (ref 80.0–100.0)
MONOS PCT: 7 %
Monocytes Absolute: 0.4 10*3/uL (ref 0.1–1.0)
NEUTROS PCT: 73 %
Neutro Abs: 4.4 10*3/uL (ref 1.7–7.7)
Platelets: 220 10*3/uL (ref 150–400)
RBC: 3.93 MIL/uL (ref 3.87–5.11)
RDW: 12.6 % (ref 11.5–15.5)
WBC: 6 10*3/uL (ref 4.0–10.5)
nRBC: 0 % (ref 0.0–0.2)

## 2018-07-15 LAB — COMPREHENSIVE METABOLIC PANEL
ALK PHOS: 89 U/L (ref 38–126)
ALT: 15 U/L (ref 0–44)
ANION GAP: 11 (ref 5–15)
AST: 16 U/L (ref 15–41)
Albumin: 3.8 g/dL (ref 3.5–5.0)
BILIRUBIN TOTAL: 0.6 mg/dL (ref 0.3–1.2)
BUN: 18 mg/dL (ref 6–20)
CO2: 26 mmol/L (ref 22–32)
Calcium: 9.1 mg/dL (ref 8.9–10.3)
Chloride: 96 mmol/L — ABNORMAL LOW (ref 98–111)
Creatinine, Ser: 0.92 mg/dL (ref 0.44–1.00)
GFR calc non Af Amer: >60 mL/min (ref >60)
GLUCOSE: 339 mg/dL — AB (ref 70–99)
Potassium: 4 mmol/L (ref 3.5–5.1)
Sodium: 133 mmol/L — ABNORMAL LOW (ref 135–145)
TOTAL PROTEIN: 7.7 g/dL (ref 6.5–8.1)

## 2018-07-15 MED ORDER — ONDANSETRON 4 MG PO TBDP: 4.0000 mg | ORAL_TABLET | Freq: Three times a day (TID) | ORAL | 0 refills | Status: DC | PRN

## 2018-07-15 MED ORDER — HYDROCODONE-ACETAMINOPHEN 5-325 MG PO TABS: 1.0000 | ORAL_TABLET | Freq: Three times a day (TID) | ORAL | 0 refills | Status: DC | PRN

## 2018-07-15 MED ORDER — SODIUM CHLORIDE 0.9 % IV BOLUS
1000.0000 mL | Freq: Once | INTRAVENOUS | Status: AC
  Administered 2018-07-15: 1000 mL via INTRAVENOUS

## 2018-07-15 MED ORDER — PROMETHAZINE HCL 25 MG PO TABS: 25.0000 mg | ORAL_TABLET | Freq: Four times a day (QID) | ORAL | 0 refills | Status: DC | PRN

## 2018-07-15 MED ORDER — ONDANSETRON 8 MG PO TBDP
8.0000 mg | ORAL_TABLET | Freq: Once | ORAL | Status: AC
  Administered 2018-07-15: 8 mg via ORAL

## 2018-07-15 NOTE — ED Provider Notes (Signed)
[facility]    CSN: [id number] Arrival date & time: 07/15/18  1616  History   Chief Complaint Chief Complaint  Patient presents with  . Dysuria  . Back Pain   HPI  52 year old female presents with flank pain, abdominal pain.  Patient states that she has had symptoms for the past week.  Saw her primary care physician on Monday.  Was placed on Macrobid.  Was also given Flomax and tramadol for possible stone.  She has a history of kidney stones.  Patient reports that she improved with treatment but then worsened again yesterday.  She states that she feels poorly currently.  She reports right flank pain as well as right lower quadrant pain.  Suspect feels fatigued.  Also feels nauseated.  Not having much dysuria.  No known exacerbating factors.  Pain is moderate.  No fever.  She has not been able to eat or drink much due to nausea.  No known relieving factors.  PMH, Surgical Hx, Family Hx, Social History reviewed and updated as below.  Past Medical History:  Diagnosis Date  . Anemia   . Anemia 05/22/2014  . Blood transfusion   . Diabetes mellitus    diagnosed 1996-insulin and metformin  . Endometriosis   . Fibromyalgia   . GERD (gastroesophageal reflux disease)   . History of blood transfusion 12/09   [facility]  . Hyperlipidemia   . Hypertension   . Neuromuscular disorder (HCC)    fibromyalgia  . Seasonal allergies    recent bronchitis-chest xray/ct scan-granuloma noted present since 2008  . Sinusitis   . Splenomegaly 05/22/2014  Kidney stone  Patient Active Problem List   Diagnosis Date Noted  . Splenomegaly 05/22/2014  . Anemia 05/22/2014  . Obesity (BMI 30-39.9) 02/26/2014  . Diabetes (HCC) 11/15/2012  . Abdominal pain, right side. 10/23/2011    Past Surgical History:  Procedure Laterality Date  . ABDOMINAL SURGERY     LOA for endomeriosis  . APPENDECTOMY  05/1995  . CESAREAN SECTION    . CHOLECYSTECTOMY  01/1995  . COLONOSCOPY    . lysis of adhesions   1996/ 2002  . TONSILLECTOMY  09/1994    OB History    Gravida  1   Para  1   Term  0   Preterm  1   AB  0   Living  1     SAB  0   TAB  0   Ectopic  0   Multiple  0   Live Births              Home Medications    Prior to Admission medications   Medication Sig Start Date End Date Taking? Authorizing Provider  ergocalciferol (VITAMIN D2) 50000 UNITS capsule Take 50,000 Units by mouth once a week. On Wednesdays.   Yes [provider]  Insulin Aspart (NOVOLOG [place]) Inject 2-25 Units into the skin 3 (three) times daily. Sliding scale insulin, depending on blood sugar reading & carbs eaten, and with snacks   Yes [provider]  insulin glargine (LANTUS) 100 UNIT/ML injection Inject 40 Units into the skin at bedtime.   Yes [provider]  metoprolol (LOPRESSOR) 100 MG tablet Take 150 mg by mouth 2 (two) times daily.   Yes [provider]  HYDROcodone-acetaminophen (NORCO/VICODIN) 5-325 MG tablet Take 1 tablet by mouth every 8 (eight) hours as needed. 07/15/18   Tommie Sams, DO  metFORMIN (GLUCOPHAGE) 1000 MG tablet Take 1,000 mg by  mouth 2 (two) times daily with a meal.    [provider]  ondansetron (ZOFRAN-ODT) 4 MG disintegrating tablet Take 1 tablet (4 mg total) by mouth every 8 (eight) hours as needed for nausea or vomiting. 07/15/18   Tommie Samsook, Samina Weekes G, DO  promethazine (PHENERGAN) 25 MG tablet Take 1 tablet (25 mg total) by mouth every 6 (six) hours as needed for nausea or vomiting. 07/15/18   Tommie Samsook, Brent Taillon G, DO   Family History Family History  Problem Relation Age of Onset  . Anemia Mother    Social History Social History   Tobacco Use  . Smoking status: Never Smoker  . Smokeless tobacco: Never Used  Substance Use Topics  . Alcohol use: No  . Drug use: No   Allergies   Codeine; Duloxetine hcl; and Metoclopramide hcl  Review of Systems Review of Systems  Constitutional: Positive for appetite change and  fatigue. Negative for fever.  Gastrointestinal: Positive for abdominal pain and nausea.  Genitourinary: Positive for flank pain.   Physical Exam Triage Vital Signs ED Triage Vitals  Enc Vitals Group     BP 07/15/18 1635 (!) 147/83     Pulse Rate 07/15/18 1635 85     Resp 07/15/18 1635 18     Temp 07/15/18 1635 98 F (36.7 C)     Temp Source 07/15/18 1635 Oral     SpO2 07/15/18 1635 99 %     Weight 07/15/18 1631 222 lb (100.7 kg)     Height 07/15/18 1631 5\' 10"  (1.778 m)     Head Circumference --      Peak Flow --      Pain Score 07/15/18 1630 5     Pain Loc --      Pain Edu? --      Excl. in GC? --    Updated Vital Signs BP (!) 147/83 (BP Location: Left Arm)   Pulse 85   Temp 98 F (36.7 C) (Oral)   Resp 18   Ht 5\' 10"  (1.778 m)   Wt 100.7 kg   LMP 06/14/2018 (Approximate)   SpO2 99%   BMI 31.85 kg/m   Visual Acuity Right Eye Distance:   Left Eye Distance:   Bilateral Distance:    Right Eye Near:   Left Eye Near:    Bilateral Near:     Physical Exam  Constitutional: She is oriented to person, place, and time. She appears well-developed. No distress.  HENT:  Head: Normocephalic and atraumatic.  Dry mouth.  Cardiovascular: Normal rate and regular rhythm.  Pulmonary/Chest: Effort normal and breath sounds normal. She has no wheezes. She has no rales.  Abdominal: Soft.  Right lower quadrant tenderness to palpation.  CVA tenderness on the right.  Neurological: She is alert and oriented to person, place, and time.  Psychiatric: She has a normal mood and affect. Her behavior is normal.  Nursing note and vitals reviewed.  UC Treatments / Results  Labs (all labs ordered are listed, but only abnormal results are displayed) Labs Reviewed  URINALYSIS, COMPLETE (UACMP) WITH MICROSCOPIC - Abnormal; Notable for the following components:      Result Value   Glucose, UA >1000 (*)    Ketones, ur TRACE (*)    All other components within normal limits  CBC WITH  DIFFERENTIAL/PLATELET - Abnormal; Notable for the following components:   HCT 35.3 (*)    All other components within normal limits  COMPREHENSIVE METABOLIC PANEL - Abnormal; Notable for the following  components:   Sodium 133 (*)    Chloride 96 (*)    Glucose, Bld 339 (*)    All other components within normal limits    EKG None  Radiology Ct Renal Stone Study  Result Date: 07/15/2018 CLINICAL DATA:  Back pain and dysuria for 1 week. History of urinary tract stones. EXAM: CT ABDOMEN AND PELVIS WITHOUT CONTRAST TECHNIQUE: Multidetector CT imaging of the abdomen and pelvis was performed following the standard protocol without IV contrast. COMPARISON:  CT abdomen and pelvis 09/25/2010. FINDINGS: Lower chest: The lung bases are clear. Heart size is normal. No pleural or pericardial effusion. Hepatobiliary: No focal liver abnormality is seen. No gallstones, gallbladder wall thickening, or biliary dilatation. Pancreas: Unremarkable. No pancreatic ductal dilatation or surrounding inflammatory changes. Spleen: Normal in size without focal abnormality. Adrenals/Urinary Tract: The adrenal glands appear normal. Two nonobstructing stones are seen in the lower pole of the right kidney. The larger measures 0.9 cm in diameter. Two punctate nonobstructing stones are seen in the lower pole of the left kidney. There is no hydronephrosis on the right or left. No ureteral or urinary bladder stones are identified. Stomach/Bowel: The stomach and small and large bowel appear normal. The patient is status post appendectomy. Vascular/Lymphatic: No significant vascular findings are present. No enlarged abdominal or pelvic lymph nodes. Reproductive: Uterus and bilateral adnexa are unremarkable. Other: No ascites. Musculoskeletal: No acute abnormality. Small foci of infiltration of subcutaneous fat in the anterior abdomen are likely related to insulin injection. IMPRESSION: The patient has 2 nonobstructing stones in each  kidney. Negative for hydronephrosis or ureteral stone. No acute abnormality. Status post appendectomy and cholecystectomy. Electronically Signed   By: Drusilla Kanner M.D.   On: 07/15/2018 18:05   Procedures Procedures (including critical care time)  Medications Ordered in UC Medications  sodium chloride 0.9 % bolus 1,000 mL (0 mLs Intravenous Stopped 07/15/18 1830)  ondansetron (ZOFRAN-ODT) disintegrating tablet 8 mg (8 mg Oral Given 07/15/18 1738)    Initial Impression / Assessment and Plan / UC Course  I have reviewed the triage vital signs and the nursing notes.  Pertinent labs & imaging results that were available during my care of the patient were reviewed by me and considered in my medical decision making (see chart for details).    52 year old female presents with flank pain, abdominal pain.  Recent hyperglycemia state.  Labs revealed mild hyponatremia.  Clinically dehydrated.  Patient was given IV fluids here.  CT was obtained to assess for stone.  Patient noted to have 2 nonobstructing stones.  Continue Flomax.  Vicodin for pain.  Zofran & Phenergan as needed.  Push fluids. Follow up with Urology.  Final Clinical Impressions(s) / UC Diagnoses   Final diagnoses:  Nephrolithiasis  Nausea without vomiting  Hyperglycemia     Discharge Instructions     Medications as prescribed.  Fluids.  Rest.  Call Urologist.  Take care  Dr. Adriana Simas    ED Prescriptions    Medication Sig Dispense Auth. Provider   ondansetron (ZOFRAN-ODT) 4 MG disintegrating tablet Take 1 tablet (4 mg total) by mouth every 8 (eight) hours as needed for nausea or vomiting. 20 tablet Roux Brandy G, DO   promethazine (PHENERGAN) 25 MG tablet Take 1 tablet (25 mg total) by mouth every 6 (six) hours as needed for nausea or vomiting. 30 tablet Carlis Blanchard G, DO   HYDROcodone-acetaminophen (NORCO/VICODIN) 5-325 MG tablet Take 1 tablet by mouth every 8 (eight) hours as needed. 10 tablet [place],  Verdis Frederickson, DO       Controlled Substance Prescriptions [place] Controlled Substance Registry consulted? Not Applicable   Tommie Sams, DO 07/15/18 2009

## 2018-07-15 NOTE — ED Triage Notes (Signed)
Pt c/o back pain, dysuria, nausea, dysuria. Started about a week ago and she went to her PCP. She was given Macrobid, Flomax, Ultram for a possible kidney stone. She was feeling better but then the sx came back yesterday.

## 2018-07-15 NOTE — Discharge Instructions (Signed)
Medications as prescribed.  Fluids.  Rest.  Call Urologist.  Take care  Dr. Adriana Simasook

## 2018-08-01 DIAGNOSIS — E559 Vitamin D deficiency, unspecified: Secondary | ICD-10-CM | POA: Diagnosis not present

## 2018-08-11 MED FILL — LANTUS SOLOSTAR 100 UNITS/M: 100 | 85 days supply | Qty: 60 | Fill #2

## 2018-08-11 MED FILL — VIT D2 1.25 MG (50,000 UNIT: 1.25 MG | 28 days supply | Qty: 8 | Fill #1

## 2018-08-11 MED FILL — LEVOTHYROXINE 50 MCG TABLET: 50 | 30 days supply | Qty: 30 | Fill #6

## 2018-08-15 MED FILL — METOPROLOL TARTRATE 50 MG T: 50 | 30 days supply | Qty: 180 | Fill #0

## 2018-08-30 DIAGNOSIS — E119 Type 2 diabetes mellitus without complications: Secondary | ICD-10-CM | POA: Diagnosis not present

## 2018-08-30 DIAGNOSIS — H25013 Cortical age-related cataract, bilateral: Secondary | ICD-10-CM | POA: Diagnosis not present

## 2018-08-30 DIAGNOSIS — H40013 Open angle with borderline findings, low risk, bilateral: Secondary | ICD-10-CM | POA: Diagnosis not present

## 2018-08-30 DIAGNOSIS — H524 Presbyopia: Secondary | ICD-10-CM | POA: Diagnosis not present

## 2018-08-30 DIAGNOSIS — H2513 Age-related nuclear cataract, bilateral: Secondary | ICD-10-CM | POA: Diagnosis not present

## 2018-09-07 DIAGNOSIS — J019 Acute sinusitis, unspecified: Secondary | ICD-10-CM | POA: Diagnosis not present

## 2018-09-07 DIAGNOSIS — R509 Fever, unspecified: Secondary | ICD-10-CM | POA: Diagnosis not present

## 2018-09-07 DIAGNOSIS — R7989 Other specified abnormal findings of blood chemistry: Secondary | ICD-10-CM | POA: Diagnosis not present

## 2018-09-07 DIAGNOSIS — N390 Urinary tract infection, site not specified: Secondary | ICD-10-CM | POA: Diagnosis not present

## 2018-09-07 DIAGNOSIS — R6889 Other general symptoms and signs: Secondary | ICD-10-CM | POA: Diagnosis not present

## 2018-09-07 DIAGNOSIS — E875 Hyperkalemia: Secondary | ICD-10-CM | POA: Diagnosis not present

## 2018-09-08 DIAGNOSIS — R7989 Other specified abnormal findings of blood chemistry: Secondary | ICD-10-CM | POA: Diagnosis not present

## 2018-09-08 DIAGNOSIS — E875 Hyperkalemia: Secondary | ICD-10-CM | POA: Diagnosis not present

## 2018-09-08 DIAGNOSIS — N390 Urinary tract infection, site not specified: Secondary | ICD-10-CM | POA: Diagnosis not present

## 2018-09-12 DIAGNOSIS — R748 Abnormal levels of other serum enzymes: Secondary | ICD-10-CM | POA: Diagnosis not present

## 2018-09-14 DIAGNOSIS — R7989 Other specified abnormal findings of blood chemistry: Secondary | ICD-10-CM | POA: Diagnosis not present

## 2018-09-14 DIAGNOSIS — R319 Hematuria, unspecified: Secondary | ICD-10-CM | POA: Diagnosis not present

## 2018-09-14 DIAGNOSIS — I10 Essential (primary) hypertension: Secondary | ICD-10-CM | POA: Diagnosis not present

## 2018-09-19 DIAGNOSIS — R7989 Other specified abnormal findings of blood chemistry: Secondary | ICD-10-CM | POA: Diagnosis not present

## 2018-09-19 DIAGNOSIS — R319 Hematuria, unspecified: Secondary | ICD-10-CM | POA: Diagnosis not present

## 2018-09-19 DIAGNOSIS — I10 Essential (primary) hypertension: Secondary | ICD-10-CM | POA: Diagnosis not present

## 2018-09-19 DIAGNOSIS — N390 Urinary tract infection, site not specified: Secondary | ICD-10-CM | POA: Diagnosis not present

## 2018-10-05 DIAGNOSIS — E7800 Pure hypercholesterolemia, unspecified: Secondary | ICD-10-CM | POA: Diagnosis not present

## 2018-10-05 DIAGNOSIS — E038 Other specified hypothyroidism: Secondary | ICD-10-CM | POA: Diagnosis not present

## 2018-10-05 DIAGNOSIS — E119 Type 2 diabetes mellitus without complications: Secondary | ICD-10-CM | POA: Diagnosis not present

## 2018-10-05 DIAGNOSIS — E559 Vitamin D deficiency, unspecified: Secondary | ICD-10-CM | POA: Diagnosis not present

## 2018-10-05 DIAGNOSIS — I10 Essential (primary) hypertension: Secondary | ICD-10-CM | POA: Diagnosis not present

## 2018-10-05 DIAGNOSIS — M546 Pain in thoracic spine: Secondary | ICD-10-CM | POA: Diagnosis not present

## 2018-10-05 MED FILL — predniSONE 10 MG TABS: 10 | 6 days supply | Qty: 21 | Fill #0

## 2018-10-05 MED FILL — tiZANidine HCL 4 MG TABS: 4 | 7 days supply | Qty: 21 | Fill #0

## 2018-10-17 DIAGNOSIS — E7800 Pure hypercholesterolemia, unspecified: Secondary | ICD-10-CM | POA: Diagnosis not present

## 2018-10-17 DIAGNOSIS — E559 Vitamin D deficiency, unspecified: Secondary | ICD-10-CM | POA: Diagnosis not present

## 2018-10-17 DIAGNOSIS — I10 Essential (primary) hypertension: Secondary | ICD-10-CM | POA: Diagnosis not present

## 2018-10-17 DIAGNOSIS — E038 Other specified hypothyroidism: Secondary | ICD-10-CM | POA: Diagnosis not present

## 2018-10-17 DIAGNOSIS — E119 Type 2 diabetes mellitus without complications: Secondary | ICD-10-CM | POA: Diagnosis not present

## 2018-10-17 DIAGNOSIS — R7989 Other specified abnormal findings of blood chemistry: Secondary | ICD-10-CM | POA: Diagnosis not present

## 2018-10-19 DIAGNOSIS — S29012A Strain of muscle and tendon of back wall of thorax, initial encounter: Secondary | ICD-10-CM | POA: Diagnosis not present

## 2018-10-19 DIAGNOSIS — I10 Essential (primary) hypertension: Secondary | ICD-10-CM | POA: Diagnosis not present

## 2018-10-19 DIAGNOSIS — M546 Pain in thoracic spine: Secondary | ICD-10-CM | POA: Diagnosis not present

## 2018-10-19 MED FILL — LEVOTHYROXINE 50 MCG TABLET: 50 | 90 days supply | Qty: 90 | Fill #0

## 2018-10-19 MED FILL — METOPROLOL TARTRATE 50 MG T: 50 | 30 days supply | Qty: 180 | Fill #1 | Status: TO

## 2018-10-19 MED FILL — LISINOPRIL 2.5 MG TABLET: 2.5 | 90 days supply | Qty: 90 | Fill #3

## 2018-10-19 MED FILL — VIT D2 1.25 MG (50,000 UNIT: 1.25 MG | 28 days supply | Qty: 8 | Fill #2 | Status: TO

## 2018-10-19 MED FILL — tiZANidine HCL 4 MG TABS: 4 | 14 days supply | Qty: 42 | Fill #0

## 2018-10-20 DIAGNOSIS — M546 Pain in thoracic spine: Secondary | ICD-10-CM | POA: Diagnosis not present

## 2018-12-05 MED FILL — LANTUS SOLOSTAR 100 UNITS/M: 100 | 90 days supply | Qty: 54 | Fill #0

## 2018-12-05 MED FILL — VIT D2 1.25 MG (50,000 UNIT: 1.25 MG | 28 days supply | Qty: 8 | Fill #0

## 2018-12-05 MED FILL — METOPROLOL TARTRATE 50 MG T: 50 | 30 days supply | Qty: 180 | Fill #0

## 2018-12-05 MED FILL — HUMALOG 100 UNITS/ML KWIKPE: 100 | 90 days supply | Qty: 81 | Fill #0

## 2018-12-07 MED FILL — FREESTYLE LIBRE 14 DAY SENS: 84 days supply | Qty: 6 | Fill #0

## 2018-12-26 DIAGNOSIS — E119 Type 2 diabetes mellitus without complications: Secondary | ICD-10-CM | POA: Diagnosis not present

## 2018-12-26 DIAGNOSIS — E7800 Pure hypercholesterolemia, unspecified: Secondary | ICD-10-CM | POA: Diagnosis not present

## 2019-01-11 DIAGNOSIS — N390 Urinary tract infection, site not specified: Secondary | ICD-10-CM | POA: Diagnosis not present

## 2019-01-11 DIAGNOSIS — E119 Type 2 diabetes mellitus without complications: Secondary | ICD-10-CM | POA: Diagnosis not present

## 2019-02-07 MED FILL — METOPROLOL TARTRATE 50 MG T: 50 | 30 days supply | Qty: 180 | Fill #0

## 2019-02-15 MED FILL — VIT D2 1.25 MG (50,000 UNIT: 1.25 MG | 28 days supply | Qty: 8 | Fill #0

## 2019-02-15 MED FILL — LEVOTHYROXINE 50 MCG TABLET: 50 | 90 days supply | Qty: 90 | Fill #1

## 2019-02-16 DIAGNOSIS — Z7189 Other specified counseling: Secondary | ICD-10-CM | POA: Diagnosis not present

## 2019-02-16 DIAGNOSIS — J0100 Acute maxillary sinusitis, unspecified: Secondary | ICD-10-CM | POA: Diagnosis not present

## 2019-02-16 DIAGNOSIS — R51 Headache: Secondary | ICD-10-CM | POA: Diagnosis not present

## 2019-02-16 MED FILL — CEFUROXIME AXETIL 500 MG TA: 500 | 7 days supply | Qty: 14 | Fill #0

## 2019-03-09 DIAGNOSIS — M797 Fibromyalgia: Secondary | ICD-10-CM | POA: Diagnosis not present

## 2019-03-09 DIAGNOSIS — E538 Deficiency of other specified B group vitamins: Secondary | ICD-10-CM | POA: Diagnosis not present

## 2019-03-09 DIAGNOSIS — E119 Type 2 diabetes mellitus without complications: Secondary | ICD-10-CM | POA: Diagnosis not present

## 2019-03-09 DIAGNOSIS — Z0000 Encounter for general adult medical examination without abnormal findings: Secondary | ICD-10-CM | POA: Diagnosis not present

## 2019-03-09 DIAGNOSIS — I10 Essential (primary) hypertension: Secondary | ICD-10-CM | POA: Diagnosis not present

## 2019-03-09 DIAGNOSIS — Z800 Family history of malignant neoplasm of digestive organs: Secondary | ICD-10-CM | POA: Diagnosis not present

## 2019-03-09 MED FILL — DULoxetine HCL 30 MG CPEP: 30 | 90 days supply | Qty: 90 | Fill #0

## 2019-03-15 MED FILL — HUMALOG 100 UNITS/ML KWIKPE: 100 | 90 days supply | Qty: 81 | Fill #0

## 2019-03-15 MED FILL — FREESTYLE LIBRE 14 DAY SENS: 84 days supply | Qty: 6 | Fill #0

## 2019-03-15 MED FILL — METOPROLOL TARTRATE 50 MG T: 50 | 30 days supply | Qty: 180 | Fill #1

## 2019-03-15 MED FILL — VIT D2 1.25 MG (50,000 UNIT: 1.25 MG | 28 days supply | Qty: 8 | Fill #1

## 2019-03-15 MED FILL — LANTUS SOLOSTAR 100 UNITS/M: 100 | 30 days supply | Qty: 18 | Fill #0

## 2019-04-12 DIAGNOSIS — H00012 Hordeolum externum right lower eyelid: Secondary | ICD-10-CM | POA: Diagnosis not present

## 2019-04-12 MED FILL — ERYTHROMYCIN EYE OINTMENT: 5 | 7 days supply | Qty: 4 | Fill #0

## 2019-04-21 MED FILL — METOPROLOL TARTRATE 50 MG T: 50 | 30 days supply | Qty: 180 | Fill #2

## 2019-05-02 DIAGNOSIS — E559 Vitamin D deficiency, unspecified: Secondary | ICD-10-CM | POA: Diagnosis not present

## 2019-05-02 DIAGNOSIS — E7800 Pure hypercholesterolemia, unspecified: Secondary | ICD-10-CM | POA: Diagnosis not present

## 2019-05-02 DIAGNOSIS — E119 Type 2 diabetes mellitus without complications: Secondary | ICD-10-CM | POA: Diagnosis not present

## 2019-05-02 DIAGNOSIS — E538 Deficiency of other specified B group vitamins: Secondary | ICD-10-CM | POA: Diagnosis not present

## 2019-05-02 DIAGNOSIS — R7989 Other specified abnormal findings of blood chemistry: Secondary | ICD-10-CM | POA: Diagnosis not present

## 2019-05-02 DIAGNOSIS — I10 Essential (primary) hypertension: Secondary | ICD-10-CM | POA: Diagnosis not present

## 2019-05-02 DIAGNOSIS — Z0000 Encounter for general adult medical examination without abnormal findings: Secondary | ICD-10-CM | POA: Diagnosis not present

## 2019-05-08 DIAGNOSIS — E119 Type 2 diabetes mellitus without complications: Secondary | ICD-10-CM | POA: Diagnosis not present

## 2019-05-08 DIAGNOSIS — E559 Vitamin D deficiency, unspecified: Secondary | ICD-10-CM | POA: Diagnosis not present

## 2019-05-08 DIAGNOSIS — E038 Other specified hypothyroidism: Secondary | ICD-10-CM | POA: Diagnosis not present

## 2019-05-08 DIAGNOSIS — E7800 Pure hypercholesterolemia, unspecified: Secondary | ICD-10-CM | POA: Diagnosis not present

## 2019-05-08 DIAGNOSIS — I10 Essential (primary) hypertension: Secondary | ICD-10-CM | POA: Diagnosis not present

## 2019-05-08 MED FILL — TRULICITY 1.5 MG/0.5 ML PEN: 1.5 | 28 days supply | Qty: 2 | Fill #0

## 2019-05-10 MED FILL — LANTUS SOLOSTAR 100 UNITS/M: 100 | 30 days supply | Qty: 18 | Fill #0

## 2019-05-10 MED FILL — VIT D2 1.25 MG (50,000 UNIT: 1.25 MG | 28 days supply | Qty: 8 | Fill #2

## 2019-05-29 MED FILL — METOPROLOL TARTRATE 50 MG T: 50 | 30 days supply | Qty: 180 | Fill #3

## 2019-06-07 MED FILL — LEVOTHYROXINE 50 MCG TABLET: 50 | 90 days supply | Qty: 90 | Fill #2

## 2019-06-07 MED FILL — LANTUS SOLOSTAR 100 UNITS/M: 100 | 30 days supply | Qty: 18 | Fill #1

## 2019-06-12 MED FILL — TRULICITY 1.5 MG/0.5 ML PEN: 1.5 | 28 days supply | Qty: 2 | Fill #1

## 2019-06-28 MED FILL — METOPROLOL TARTRATE 50 MG T: 50 | 30 days supply | Qty: 180 | Fill #0

## 2019-07-04 DIAGNOSIS — L719 Rosacea, unspecified: Secondary | ICD-10-CM | POA: Diagnosis not present

## 2019-07-04 DIAGNOSIS — E119 Type 2 diabetes mellitus without complications: Secondary | ICD-10-CM | POA: Diagnosis not present

## 2019-07-04 DIAGNOSIS — M109 Gout, unspecified: Secondary | ICD-10-CM | POA: Diagnosis not present

## 2019-07-04 DIAGNOSIS — L0390 Cellulitis, unspecified: Secondary | ICD-10-CM | POA: Diagnosis not present

## 2019-07-04 MED FILL — predniSONE 10 MG TABS: 10 | 6 days supply | Qty: 21 | Fill #0

## 2019-07-04 MED FILL — DOXYCYCLINE HYCLATE 100 MG: 100 | 6 days supply | Qty: 6 | Fill #0

## 2019-07-18 MED FILL — TRULICITY 1.5 MG/0.5 ML PEN: 1.5 | 28 days supply | Qty: 2 | Fill #2

## 2019-07-18 MED FILL — LANTUS SOLOSTAR 100 UNITS/M: 100 | 30 days supply | Qty: 18 | Fill #2

## 2019-07-25 DIAGNOSIS — E119 Type 2 diabetes mellitus without complications: Secondary | ICD-10-CM | POA: Diagnosis not present

## 2019-07-25 DIAGNOSIS — E559 Vitamin D deficiency, unspecified: Secondary | ICD-10-CM | POA: Diagnosis not present

## 2019-07-25 DIAGNOSIS — R7989 Other specified abnormal findings of blood chemistry: Secondary | ICD-10-CM | POA: Diagnosis not present

## 2019-07-25 DIAGNOSIS — E7800 Pure hypercholesterolemia, unspecified: Secondary | ICD-10-CM | POA: Diagnosis not present

## 2019-07-25 DIAGNOSIS — E038 Other specified hypothyroidism: Secondary | ICD-10-CM | POA: Diagnosis not present

## 2019-07-28 MED FILL — ONDANSETRON HCL 4 MG TABLET: 4 | 14 days supply | Qty: 14 | Fill #0

## 2019-07-28 MED FILL — METOPROLOL TARTRATE 50 MG T: 50 | 30 days supply | Qty: 180 | Fill #1

## 2019-07-28 MED FILL — DULoxetine HCL 60 MG CPEP: 60 | 90 days supply | Qty: 90 | Fill #0

## 2019-07-28 MED FILL — AZITHROMYCIN 250 MG TABLET: 250 | 5 days supply | Qty: 6 | Fill #0

## 2019-08-01 DIAGNOSIS — E119 Type 2 diabetes mellitus without complications: Secondary | ICD-10-CM | POA: Diagnosis not present

## 2019-08-01 DIAGNOSIS — I10 Essential (primary) hypertension: Secondary | ICD-10-CM | POA: Diagnosis not present

## 2019-08-01 DIAGNOSIS — Z0000 Encounter for general adult medical examination without abnormal findings: Secondary | ICD-10-CM | POA: Diagnosis not present

## 2019-08-01 DIAGNOSIS — E7800 Pure hypercholesterolemia, unspecified: Secondary | ICD-10-CM | POA: Diagnosis not present

## 2019-08-01 DIAGNOSIS — R7989 Other specified abnormal findings of blood chemistry: Secondary | ICD-10-CM | POA: Diagnosis not present

## 2019-08-01 DIAGNOSIS — E038 Other specified hypothyroidism: Secondary | ICD-10-CM | POA: Diagnosis not present

## 2019-08-01 DIAGNOSIS — E559 Vitamin D deficiency, unspecified: Secondary | ICD-10-CM | POA: Diagnosis not present

## 2019-08-01 MED FILL — VIT D2 1.25 MG (50,000 UNIT: 1.25 MG | 90 days supply | Qty: 24 | Fill #0

## 2019-08-01 MED FILL — ROSUVASTATIN CALCIUM 5 MG T: 5 | 90 days supply | Qty: 36 | Fill #0

## 2019-08-02 DIAGNOSIS — H40013 Open angle with borderline findings, low risk, bilateral: Secondary | ICD-10-CM | POA: Diagnosis not present

## 2019-08-02 DIAGNOSIS — E119 Type 2 diabetes mellitus without complications: Secondary | ICD-10-CM | POA: Diagnosis not present

## 2019-08-02 DIAGNOSIS — H524 Presbyopia: Secondary | ICD-10-CM | POA: Diagnosis not present

## 2019-08-02 DIAGNOSIS — H2513 Age-related nuclear cataract, bilateral: Secondary | ICD-10-CM | POA: Diagnosis not present

## 2019-08-02 DIAGNOSIS — H43811 Vitreous degeneration, right eye: Secondary | ICD-10-CM | POA: Diagnosis not present

## 2019-08-21 IMAGING — CT CT RENAL STONE PROTOCOL
1 of 2 series · 15 of 32 positions shown, 19 images · non-contrast
Comparison: CT abdomen and pelvis 09/25/2010.

CLINICAL DATA: Back pain and dysuria for 1 week. History of urinary
tract stones.

EXAM:
CT ABDOMEN AND PELVIS WITHOUT CONTRAST
TECHNIQUE: Multidetector CT imaging of the abdomen and pelvis was performed
following the standard protocol without IV contrast.

[Series 2: axial st · axial · 0.78mm/px · z∈[-1021,-541]mm · 15 of 106 slices shown, 19 images]
[im 5/106  soft-tissue]
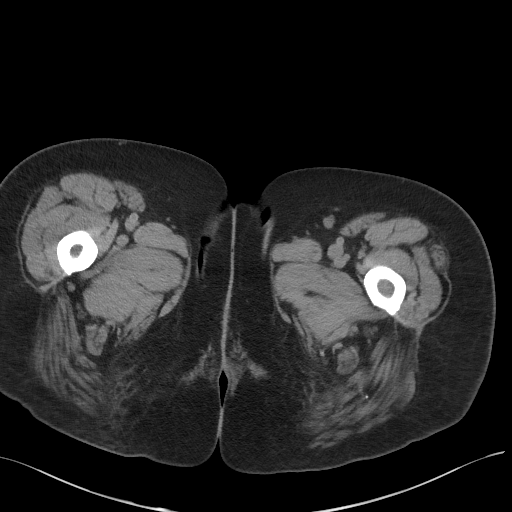
[im 5/106  bone]
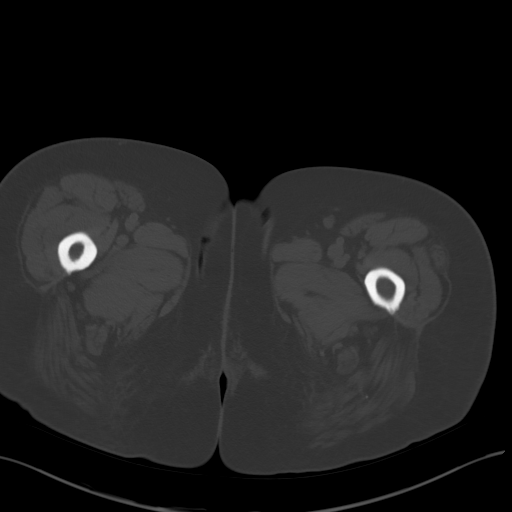
[im 14/106  soft-tissue]
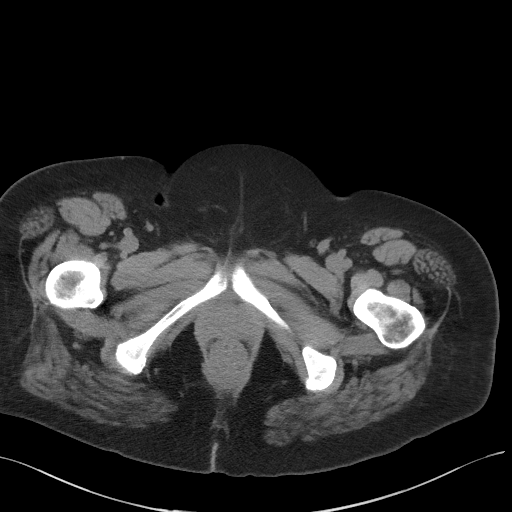
[im 22/106  soft-tissue]
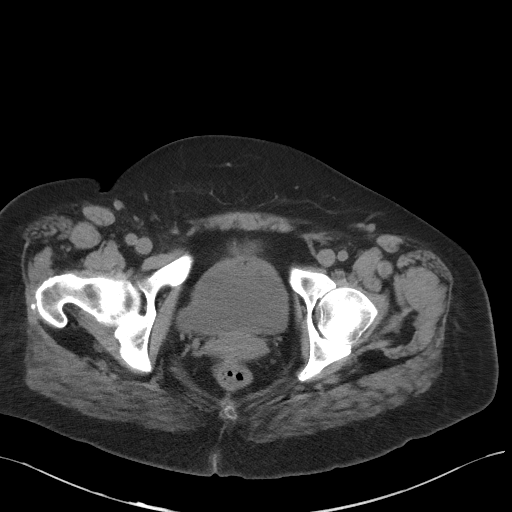
[im 31/106  soft-tissue]
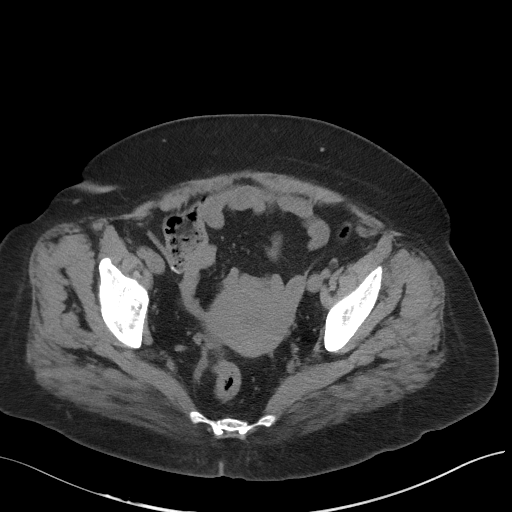
[im 36/106  soft-tissue]
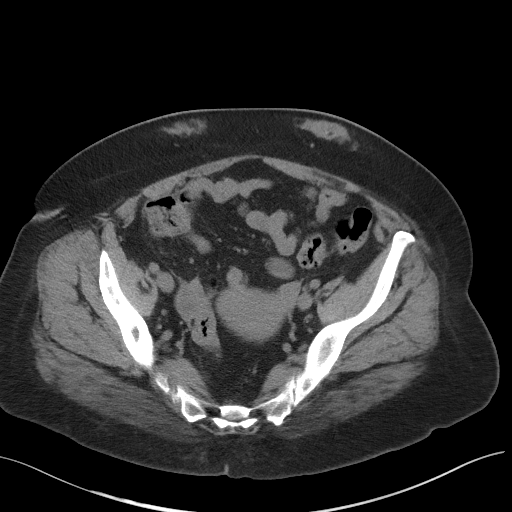
[im 44/106  soft-tissue]
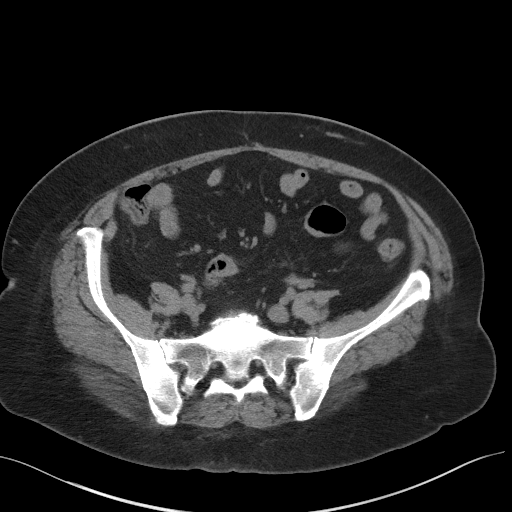
[im 53/106  soft-tissue]
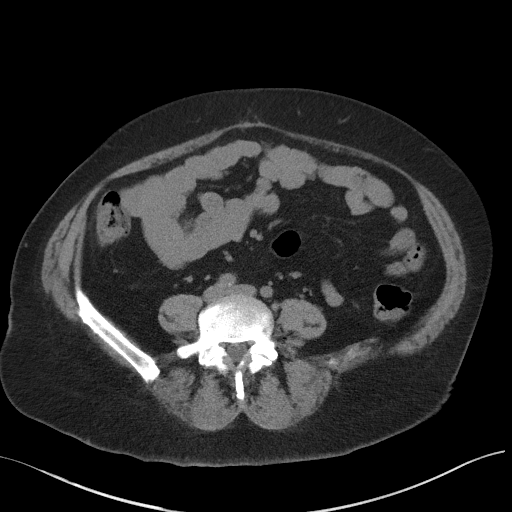
[im 62/106  soft-tissue]
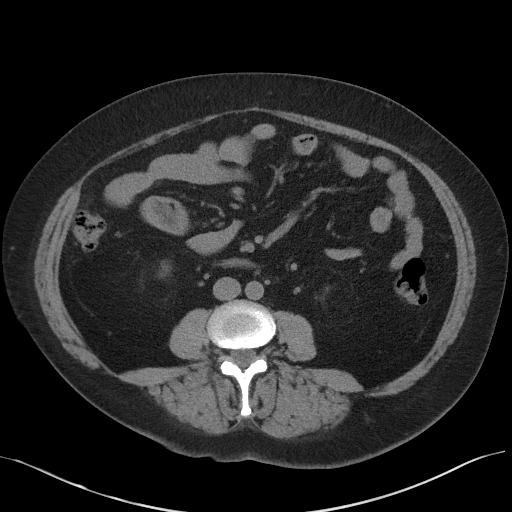
[im 71/106  soft-tissue]
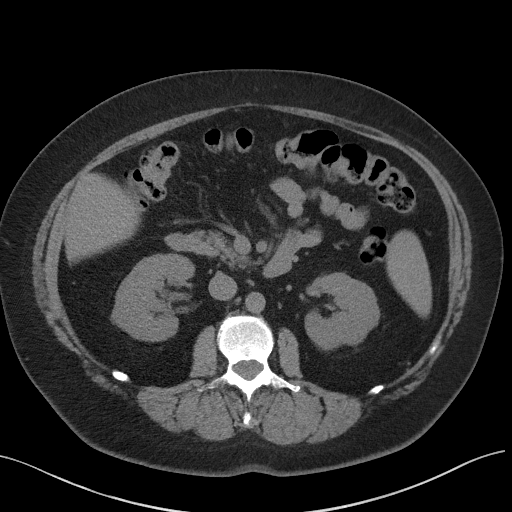
[im 71/106  bone]
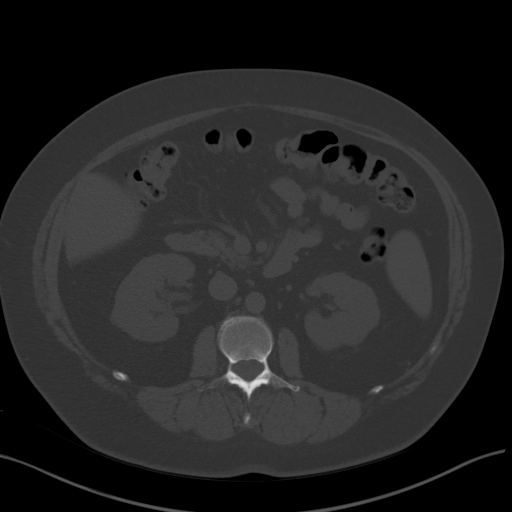
[im 75/106  soft-tissue]
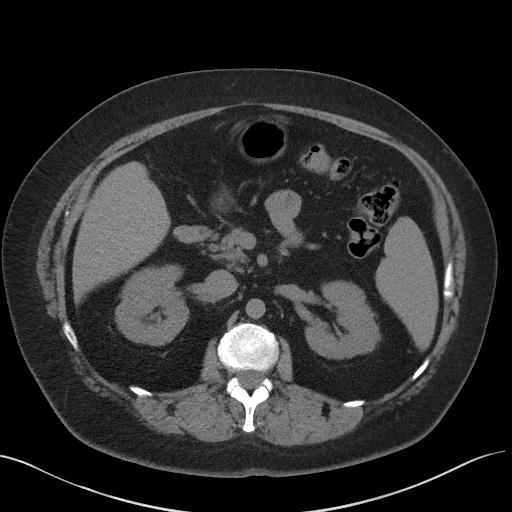
[im 84/106  soft-tissue]
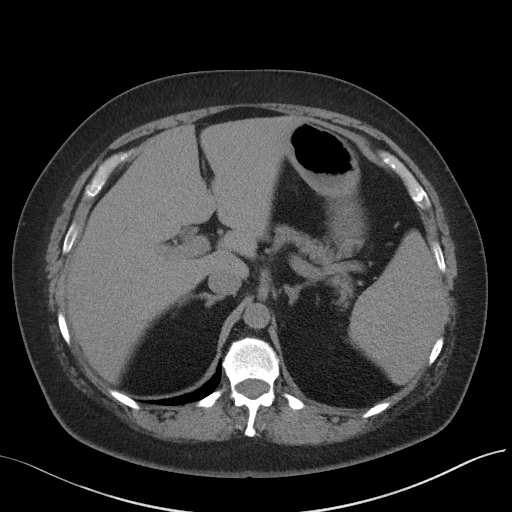
[im 88/106  lung]
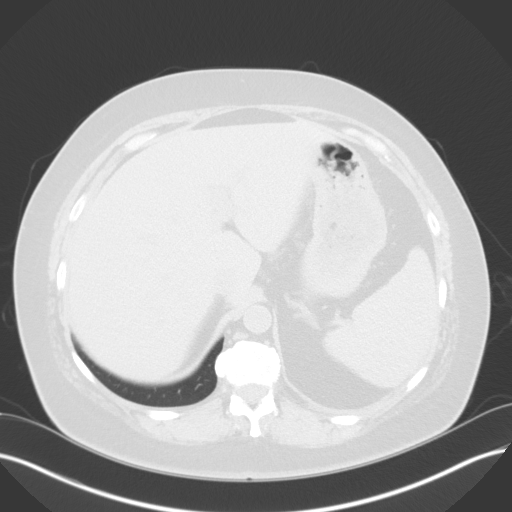
[im 92/106  soft-tissue]
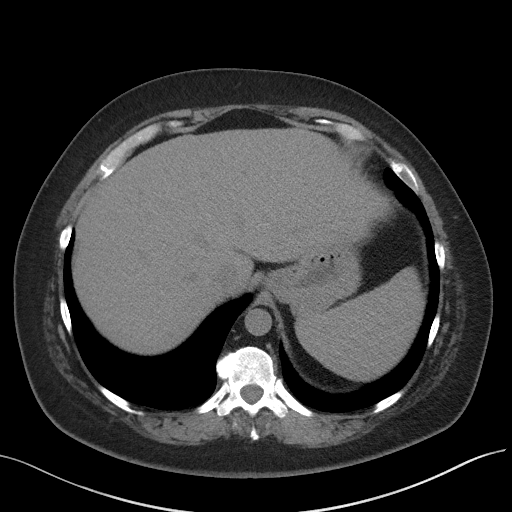
[im 92/106  lung]
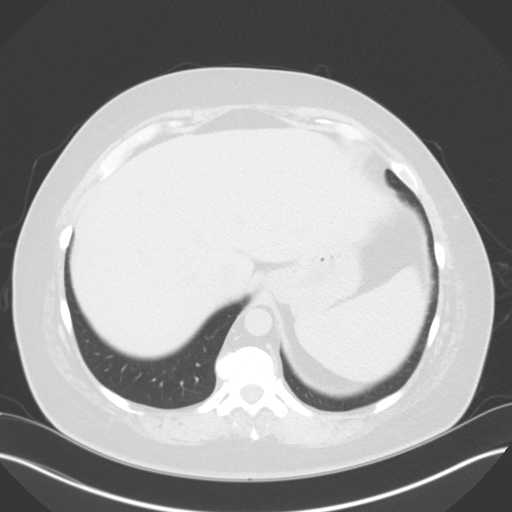
[im 97/106  lung]
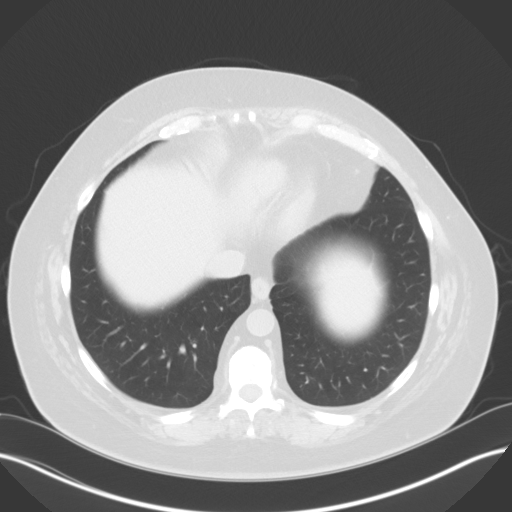
[im 101/106  soft-tissue]
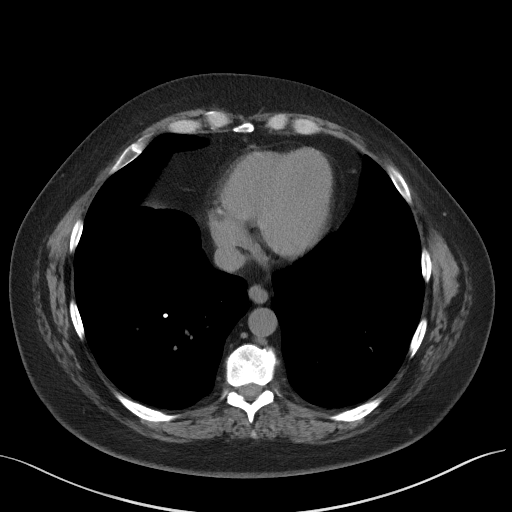
[im 101/106  lung]
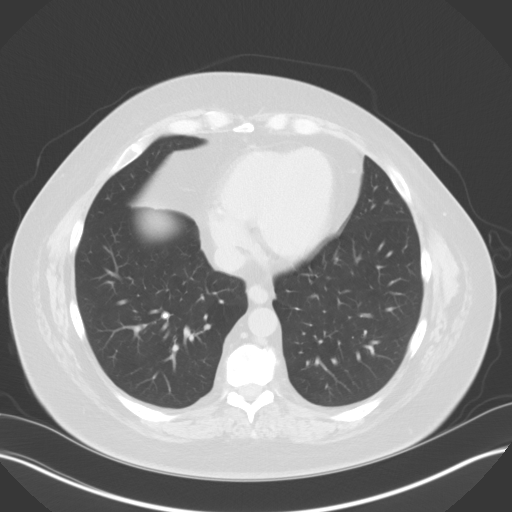

[15 of 32 positions shown; findings below may reference images not displayed]

FINDINGS: Lower chest: The lung bases are clear. Heart size is normal. No
pleural or pericardial effusion.

Hepatobiliary: No focal liver abnormality is seen. No gallstones,
gallbladder wall thickening, or biliary dilatation.

Pancreas: Unremarkable. No pancreatic ductal dilatation or
surrounding inflammatory changes.

Spleen: Normal in size without focal abnormality.

Adrenals/Urinary Tract: The adrenal glands appear normal. Two
nonobstructing stones are seen in the lower pole of the right
kidney. The larger measures 0.9 cm in diameter. Two punctate
nonobstructing stones are seen in the lower pole of the left kidney.
There is no hydronephrosis on the right or left. No ureteral or
urinary bladder stones are identified.

Stomach/Bowel: The stomach and small and large bowel appear normal.
The patient is status post appendectomy.

Vascular/Lymphatic: No significant vascular findings are present. No
enlarged abdominal or pelvic lymph nodes.

Reproductive: Uterus and bilateral adnexa are unremarkable.

Other: No ascites.

Musculoskeletal: No acute abnormality. Small foci of infiltration of
subcutaneous fat in the anterior abdomen are likely related to
insulin injection.
IMPRESSION: The patient has 2 nonobstructing stones in each kidney. Negative for
hydronephrosis or ureteral stone. No acute abnormality.

Status post appendectomy and cholecystectomy.

## 2019-08-23 MED FILL — METOPROLOL TARTRATE 50 MG T: 50 | 30 days supply | Qty: 180 | Fill #2

## 2019-08-23 MED FILL — LANTUS SOLOSTAR 100 UNITS/M: 100 | 30 days supply | Qty: 18 | Fill #3

## 2019-08-24 MED FILL — HUMALOG 100 UNITS/ML KWIKPE: 100 | 30 days supply | Qty: 27 | Fill #1

## 2019-08-24 MED FILL — DEXCOM G6 SENSOR MISC: 30 days supply | Qty: 3 | Fill #0

## 2019-08-24 MED FILL — DEXCOM G6 RECEIVER DEVI: 30 days supply | Qty: 1 | Fill #0

## 2019-08-24 MED FILL — DEXCOM G6 TRANSMITTER MISC: 90 days supply | Qty: 1 | Fill #0

## 2019-09-13 MED FILL — TRULICITY 1.5 MG/0.5 ML PEN: 1.5 | 28 days supply | Qty: 2 | Fill #3

## 2019-09-14 DIAGNOSIS — M898X1 Other specified disorders of bone, shoulder: Secondary | ICD-10-CM | POA: Diagnosis not present

## 2019-09-14 DIAGNOSIS — F329 Major depressive disorder, single episode, unspecified: Secondary | ICD-10-CM | POA: Diagnosis not present

## 2019-09-14 MED FILL — SERTRALINE HCL 50 MG TABLET: 50 | 30 days supply | Qty: 30 | Fill #0

## 2019-09-14 MED FILL — CYCLOBENZAPRINE 10 MG TAB: 10 | 10 days supply | Qty: 30 | Fill #0

## 2019-09-19 DIAGNOSIS — M25511 Pain in right shoulder: Secondary | ICD-10-CM | POA: Diagnosis not present

## 2019-09-27 MED FILL — HUMALOG 100 UNITS/ML KWIKPE: 100 | 30 days supply | Qty: 27 | Fill #2

## 2019-09-27 MED FILL — LANTUS SOLOSTAR 100 UNITS/M: 100 | 30 days supply | Qty: 18 | Fill #0

## 2019-10-04 MED FILL — DEXCOM G6 SENSOR MISC: 30 days supply | Qty: 3 | Fill #1

## 2019-10-04 MED FILL — METOPROLOL TARTRATE 50 MG T: 50 | 30 days supply | Qty: 180 | Fill #3

## 2019-11-10 MED FILL — LANTUS SOLOSTAR 100 UNITS/M: 100 | 30 days supply | Qty: 18 | Fill #1

## 2019-11-10 MED FILL — METOPROLOL TARTRATE 50 MG T: 50 | 30 days supply | Qty: 180 | Fill #4

## 2019-11-10 MED FILL — TRULICITY 1.5 MG/0.5 ML PEN: 1.5 | 28 days supply | Qty: 2 | Fill #4

## 2019-11-10 MED FILL — VIT D2 1.25 MG (50,000 UNIT: 1.25 MG | 90 days supply | Qty: 24 | Fill #1

## 2019-12-22 MED FILL — METOPROLOL TARTRATE 50 MG T: 50 | 30 days supply | Qty: 180 | Fill #5

## 2019-12-22 MED FILL — BASAGLAR 100 UNIT/ML KWIKPE: 100 | 30 days supply | Qty: 18 | Fill #0

## 2020-01-08 DIAGNOSIS — H16142 Punctate keratitis, left eye: Secondary | ICD-10-CM | POA: Diagnosis not present

## 2020-02-05 DIAGNOSIS — E119 Type 2 diabetes mellitus without complications: Secondary | ICD-10-CM | POA: Diagnosis not present

## 2020-02-05 DIAGNOSIS — E038 Other specified hypothyroidism: Secondary | ICD-10-CM | POA: Diagnosis not present

## 2020-02-05 DIAGNOSIS — I10 Essential (primary) hypertension: Secondary | ICD-10-CM | POA: Diagnosis not present

## 2020-02-05 DIAGNOSIS — Z0000 Encounter for general adult medical examination without abnormal findings: Secondary | ICD-10-CM | POA: Diagnosis not present

## 2020-02-05 DIAGNOSIS — E559 Vitamin D deficiency, unspecified: Secondary | ICD-10-CM | POA: Diagnosis not present

## 2020-02-05 DIAGNOSIS — E7800 Pure hypercholesterolemia, unspecified: Secondary | ICD-10-CM | POA: Diagnosis not present

## 2020-02-07 MED FILL — FLUTICASONE PROP 50 MCG SPR: 50 | 30 days supply | Qty: 16 | Fill #0

## 2020-02-20 MED FILL — BASAGLAR 100 UNIT/ML KWIKPE: 100 | 30 days supply | Qty: 18 | Fill #2

## 2020-02-20 MED FILL — DEXCOM G6 SENSOR MISC: 30 days supply | Qty: 3 | Fill #2

## 2020-02-20 MED FILL — FLUTICASONE PROP 50 MCG SPR: 50 | 30 days supply | Qty: 16 | Fill #0

## 2020-02-20 MED FILL — VIT D2 1.25 MG (50,000 UNIT: 1.25 MG | 90 days supply | Qty: 24 | Fill #2

## 2020-02-20 MED FILL — TRULICITY 1.5 MG/0.5 ML PEN: 1.5 | 28 days supply | Qty: 2 | Fill #6

## 2020-02-22 ENCOUNTER — Other Ambulatory Visit (HOSPITAL_COMMUNITY): Payer: Self-pay | Admitting: Internal Medicine

## 2020-02-22 MED FILL — METOPROLOL TARTRATE 50 MG T: 50 | 90 days supply | Qty: 270 | Fill #0

## 2020-02-22 MED FILL — LEVOTHYROXINE 50 MCG TABLET: 50 | 90 days supply | Qty: 90 | Fill #0

## 2020-03-13 DIAGNOSIS — E7800 Pure hypercholesterolemia, unspecified: Secondary | ICD-10-CM | POA: Diagnosis not present

## 2020-03-13 DIAGNOSIS — E538 Deficiency of other specified B group vitamins: Secondary | ICD-10-CM | POA: Diagnosis not present

## 2020-03-13 DIAGNOSIS — R790 Abnormal level of blood mineral: Secondary | ICD-10-CM | POA: Diagnosis not present

## 2020-03-13 DIAGNOSIS — Z0000 Encounter for general adult medical examination without abnormal findings: Secondary | ICD-10-CM | POA: Diagnosis not present

## 2020-03-13 DIAGNOSIS — Z23 Encounter for immunization: Secondary | ICD-10-CM | POA: Diagnosis not present

## 2020-03-13 DIAGNOSIS — D509 Iron deficiency anemia, unspecified: Secondary | ICD-10-CM | POA: Diagnosis not present

## 2020-03-13 DIAGNOSIS — E119 Type 2 diabetes mellitus without complications: Secondary | ICD-10-CM | POA: Diagnosis not present

## 2020-03-13 DIAGNOSIS — E559 Vitamin D deficiency, unspecified: Secondary | ICD-10-CM | POA: Diagnosis not present

## 2020-04-03 MED FILL — BASAGLAR 100 UNIT/ML KWIKPE: 100 | 30 days supply | Qty: 18 | Fill #3

## 2020-04-17 DIAGNOSIS — R300 Dysuria: Secondary | ICD-10-CM | POA: Diagnosis not present

## 2020-04-17 DIAGNOSIS — H6993 Unspecified Eustachian tube disorder, bilateral: Secondary | ICD-10-CM | POA: Diagnosis not present

## 2020-04-17 DIAGNOSIS — L719 Rosacea, unspecified: Secondary | ICD-10-CM | POA: Diagnosis not present

## 2020-04-17 DIAGNOSIS — Z87442 Personal history of urinary calculi: Secondary | ICD-10-CM | POA: Diagnosis not present

## 2020-04-17 DIAGNOSIS — J3489 Other specified disorders of nose and nasal sinuses: Secondary | ICD-10-CM | POA: Diagnosis not present

## 2020-04-17 MED FILL — AMOXICILLIN 875 MG TABS: 875 | 10 days supply | Qty: 20 | Fill #0

## 2020-04-17 MED FILL — metroNIDAZOLE 0.75 % GEL: 0.75 | 30 days supply | Qty: 45 | Fill #0

## 2020-04-24 ENCOUNTER — Other Ambulatory Visit (HOSPITAL_COMMUNITY): Payer: Self-pay | Admitting: Internal Medicine

## 2020-04-24 DIAGNOSIS — F329 Major depressive disorder, single episode, unspecified: Secondary | ICD-10-CM | POA: Diagnosis not present

## 2020-04-24 DIAGNOSIS — E038 Other specified hypothyroidism: Secondary | ICD-10-CM | POA: Diagnosis not present

## 2020-04-24 DIAGNOSIS — E559 Vitamin D deficiency, unspecified: Secondary | ICD-10-CM | POA: Diagnosis not present

## 2020-04-24 DIAGNOSIS — I10 Essential (primary) hypertension: Secondary | ICD-10-CM | POA: Diagnosis not present

## 2020-04-24 DIAGNOSIS — E119 Type 2 diabetes mellitus without complications: Secondary | ICD-10-CM | POA: Diagnosis not present

## 2020-04-24 DIAGNOSIS — E7800 Pure hypercholesterolemia, unspecified: Secondary | ICD-10-CM | POA: Diagnosis not present

## 2020-04-24 MED FILL — ROSUVASTATIN CALCIUM 5 MG T: 5 | 90 days supply | Qty: 90 | Fill #0

## 2020-04-24 MED FILL — CITALOPRAM HBR 10 MG TABLET: 10 | 90 days supply | Qty: 90 | Fill #0

## 2020-04-29 ENCOUNTER — Other Ambulatory Visit (HOSPITAL_COMMUNITY): Payer: Self-pay | Admitting: Endocrinology

## 2020-04-29 MED FILL — BASAGLAR 100 UNIT/ML KWIKPE: 100 | 30 days supply | Qty: 18 | Fill #4

## 2020-04-29 MED FILL — OMNIPOD DASH 5 PACK MISC: 30 days supply | Qty: 15 | Fill #0

## 2020-04-30 MED FILL — TRULICITY 1.5 MG/0.5 ML PEN: 1.5 | 28 days supply | Qty: 2 | Fill #0

## 2020-05-01 ENCOUNTER — Other Ambulatory Visit (HOSPITAL_COMMUNITY): Payer: Self-pay | Admitting: Endocrinology

## 2020-05-02 MED FILL — HumaLOG 100 UNIT/ML SOLN: 100 | 90 days supply | Qty: 90 | Fill #0

## 2020-05-03 MED FILL — FLUARIX QUADRIVALENT 0.5 ML: 0.5 | 1 days supply | Qty: 1 | Fill #0

## 2020-05-08 MED FILL — METOPROLOL TARTRATE 50 MG T: 50 | 90 days supply | Qty: 540 | Fill #0

## 2020-05-20 ENCOUNTER — Other Ambulatory Visit (HOSPITAL_COMMUNITY): Payer: Self-pay | Admitting: Endocrinology

## 2020-05-20 MED FILL — FREESTYLE LITE TEST STRIP: 83 days supply | Qty: 250 | Fill #0

## 2020-05-20 MED FILL — FREESTYLE LANCETS: 90 days supply | Qty: 300 | Fill #0

## 2020-05-23 MED FILL — VIT D2 1.25 MG (50,000 UNIT: 1.25 MG | 90 days supply | Qty: 24 | Fill #3

## 2020-05-23 MED FILL — LEVOTHYROXINE 50 MCG TABLET: 50 | 90 days supply | Qty: 90 | Fill #1

## 2020-05-23 MED FILL — OMNIPOD DASH 5 PACK MISC: 30 days supply | Qty: 15 | Fill #1

## 2020-05-23 MED FILL — TRULICITY 1.5 MG/0.5 ML PEN: 1.5 | 28 days supply | Qty: 2 | Fill #1

## 2020-05-28 ENCOUNTER — Other Ambulatory Visit: Payer: Self-pay | Admitting: Internal Medicine

## 2020-05-28 DIAGNOSIS — Z1231 Encounter for screening mammogram for malignant neoplasm of breast: Secondary | ICD-10-CM

## 2020-06-10 DIAGNOSIS — R790 Abnormal level of blood mineral: Secondary | ICD-10-CM | POA: Diagnosis not present

## 2020-06-10 DIAGNOSIS — E038 Other specified hypothyroidism: Secondary | ICD-10-CM | POA: Diagnosis not present

## 2020-06-10 DIAGNOSIS — E119 Type 2 diabetes mellitus without complications: Secondary | ICD-10-CM | POA: Diagnosis not present

## 2020-06-10 DIAGNOSIS — E538 Deficiency of other specified B group vitamins: Secondary | ICD-10-CM | POA: Diagnosis not present

## 2020-06-10 DIAGNOSIS — I10 Essential (primary) hypertension: Secondary | ICD-10-CM | POA: Diagnosis not present

## 2020-06-10 DIAGNOSIS — E7800 Pure hypercholesterolemia, unspecified: Secondary | ICD-10-CM | POA: Diagnosis not present

## 2020-06-10 DIAGNOSIS — E559 Vitamin D deficiency, unspecified: Secondary | ICD-10-CM | POA: Diagnosis not present

## 2020-06-10 DIAGNOSIS — R7989 Other specified abnormal findings of blood chemistry: Secondary | ICD-10-CM | POA: Diagnosis not present

## 2020-06-20 DIAGNOSIS — Z1231 Encounter for screening mammogram for malignant neoplasm of breast: Secondary | ICD-10-CM

## 2020-07-02 MED FILL — DEXCOM G6 SENSOR MISC: 30 days supply | Qty: 3 | Fill #3

## 2020-07-02 MED FILL — DEXCOM G6 TRANSMITTER MISC: 90 days supply | Qty: 1 | Fill #1

## 2020-07-02 MED FILL — OMNIPOD DASH 5 PACK MISC: 30 days supply | Qty: 15 | Fill #2

## 2020-07-16 MED FILL — METOPROLOL TARTRATE 50 MG T: 50 | 3 days supply | Qty: 18 | Fill #1

## 2020-07-19 MED FILL — METOPROLOL TARTRATE 50 MG T: 50 | 90 days supply | Qty: 540 | Fill #2

## 2020-07-30 ENCOUNTER — Other Ambulatory Visit (HOSPITAL_COMMUNITY): Payer: Self-pay | Admitting: Endocrinology

## 2020-07-30 DIAGNOSIS — Z9641 Presence of insulin pump (external) (internal): Secondary | ICD-10-CM | POA: Diagnosis not present

## 2020-07-30 DIAGNOSIS — E119 Type 2 diabetes mellitus without complications: Secondary | ICD-10-CM | POA: Diagnosis not present

## 2020-07-30 DIAGNOSIS — E559 Vitamin D deficiency, unspecified: Secondary | ICD-10-CM | POA: Diagnosis not present

## 2020-07-30 DIAGNOSIS — E038 Other specified hypothyroidism: Secondary | ICD-10-CM | POA: Diagnosis not present

## 2020-07-30 DIAGNOSIS — E7800 Pure hypercholesterolemia, unspecified: Secondary | ICD-10-CM | POA: Diagnosis not present

## 2020-07-30 DIAGNOSIS — I10 Essential (primary) hypertension: Secondary | ICD-10-CM | POA: Diagnosis not present

## 2020-07-30 MED FILL — TRULICITY 3 MG/0.5ML SOPN: 3 | 28 days supply | Qty: 2 | Fill #0

## 2020-08-01 DIAGNOSIS — H40013 Open angle with borderline findings, low risk, bilateral: Secondary | ICD-10-CM | POA: Diagnosis not present

## 2020-08-01 DIAGNOSIS — E119 Type 2 diabetes mellitus without complications: Secondary | ICD-10-CM | POA: Diagnosis not present

## 2020-08-01 DIAGNOSIS — H2513 Age-related nuclear cataract, bilateral: Secondary | ICD-10-CM | POA: Diagnosis not present

## 2020-08-01 DIAGNOSIS — H524 Presbyopia: Secondary | ICD-10-CM | POA: Diagnosis not present

## 2020-08-01 DIAGNOSIS — H25013 Cortical age-related cataract, bilateral: Secondary | ICD-10-CM | POA: Diagnosis not present

## 2020-08-12 ENCOUNTER — Other Ambulatory Visit (HOSPITAL_COMMUNITY): Payer: Self-pay | Admitting: Internal Medicine

## 2020-08-12 MED FILL — OMNIPOD DASH 5 PACK MISC: 30 days supply | Qty: 15 | Fill #3

## 2020-08-12 MED FILL — HumaLOG 100 UNIT/ML SOLN: 100 | 90 days supply | Qty: 90 | Fill #1

## 2020-08-12 MED FILL — SHINGRIX 50 MCG SUS: 50 | 1 days supply | Qty: 1 | Fill #0

## 2020-08-15 ENCOUNTER — Other Ambulatory Visit (HOSPITAL_COMMUNITY): Payer: Self-pay | Admitting: Endocrinology

## 2020-08-16 MED FILL — DEXCOM G6 SENSOR MISC: 30 days supply | Qty: 3 | Fill #0

## 2020-08-21 DIAGNOSIS — L84 Corns and callosities: Secondary | ICD-10-CM | POA: Diagnosis not present

## 2020-09-10 ENCOUNTER — Inpatient Hospital Stay: Admission: RE | Admit: 2020-09-10 | Payer: 59 | Source: Ambulatory Visit

## 2020-09-10 ENCOUNTER — Other Ambulatory Visit (HOSPITAL_COMMUNITY): Payer: Self-pay | Admitting: Internal Medicine

## 2020-09-25 MED FILL — LEVOTHYROXINE 50 MCG TABLET: 50 | 90 days supply | Qty: 90 | Fill #2

## 2020-09-25 MED FILL — TRULICITY 3 MG/0.5ML SOPN: 3 | 28 days supply | Qty: 2 | Fill #1

## 2020-09-25 MED FILL — OMNIPOD DASH 5 PACK MISC: 30 days supply | Qty: 15 | Fill #4

## 2020-09-25 MED FILL — DEXCOM G6 SENSOR MISC: 30 days supply | Qty: 3 | Fill #1

## 2020-10-17 MED FILL — METOPROLOL TARTRATE 50 MG T: 50 | 90 days supply | Qty: 540 | Fill #3

## 2020-10-22 ENCOUNTER — Other Ambulatory Visit (HOSPITAL_COMMUNITY): Payer: Self-pay | Admitting: Pharmacist

## 2020-10-22 MED FILL — CARESTART COVID-19 HOME TES: 4 days supply | Qty: 4 | Fill #0

## 2020-10-22 MED FILL — SHINGRIX 50 MCG SUS: 50 | 1 days supply | Qty: 1 | Fill #1

## 2020-11-07 DIAGNOSIS — E038 Other specified hypothyroidism: Secondary | ICD-10-CM | POA: Diagnosis not present

## 2020-11-07 DIAGNOSIS — E559 Vitamin D deficiency, unspecified: Secondary | ICD-10-CM | POA: Diagnosis not present

## 2020-11-07 DIAGNOSIS — E7800 Pure hypercholesterolemia, unspecified: Secondary | ICD-10-CM | POA: Diagnosis not present

## 2020-11-07 DIAGNOSIS — E119 Type 2 diabetes mellitus without complications: Secondary | ICD-10-CM | POA: Diagnosis not present

## 2020-11-12 ENCOUNTER — Other Ambulatory Visit (HOSPITAL_COMMUNITY): Payer: Self-pay | Admitting: Endocrinology

## 2020-11-12 DIAGNOSIS — E038 Other specified hypothyroidism: Secondary | ICD-10-CM | POA: Diagnosis not present

## 2020-11-12 DIAGNOSIS — E7800 Pure hypercholesterolemia, unspecified: Secondary | ICD-10-CM | POA: Diagnosis not present

## 2020-11-12 DIAGNOSIS — E538 Deficiency of other specified B group vitamins: Secondary | ICD-10-CM | POA: Diagnosis not present

## 2020-11-12 DIAGNOSIS — E559 Vitamin D deficiency, unspecified: Secondary | ICD-10-CM | POA: Diagnosis not present

## 2020-11-12 DIAGNOSIS — Z9641 Presence of insulin pump (external) (internal): Secondary | ICD-10-CM | POA: Diagnosis not present

## 2020-11-12 DIAGNOSIS — I10 Essential (primary) hypertension: Secondary | ICD-10-CM | POA: Diagnosis not present

## 2020-11-12 DIAGNOSIS — E119 Type 2 diabetes mellitus without complications: Secondary | ICD-10-CM | POA: Diagnosis not present

## 2020-11-12 MED FILL — FENOFIBRATE 145 MG TABS: 145 | 90 days supply | Qty: 90 | Fill #0

## 2020-11-12 MED FILL — ESCITALOPRAM 20 MG TABLET: 20 | 90 days supply | Qty: 90 | Fill #0

## 2020-11-12 MED FILL — TRULICITY 4.5 MG/0.5ML SOPN: 4.5 | 84 days supply | Qty: 6 | Fill #0

## 2020-11-12 MED FILL — VIT D2 1.25 MG (50,000 UNIT: 1.25 MG | 84 days supply | Qty: 12 | Fill #0

## 2020-11-12 MED FILL — TRESIBA FLEXTOUCH 100 UNITS: 100 | 40 days supply | Qty: 6 | Fill #0

## 2020-11-13 MED FILL — HumaLOG 100 UNIT/ML SOLN: 100 | 90 days supply | Qty: 90 | Fill #2

## 2020-11-14 ENCOUNTER — Telehealth: Payer: Self-pay

## 2020-11-14 NOTE — Telephone Encounter (Signed)
NOTES ON FILE FROM [facility] [phone], SENT REFERRAL TO SCHEDULING 

## 2020-11-19 ENCOUNTER — Other Ambulatory Visit (HOSPITAL_COMMUNITY): Payer: Self-pay

## 2020-11-20 ENCOUNTER — Other Ambulatory Visit (HOSPITAL_COMMUNITY): Payer: Self-pay

## 2020-11-20 DIAGNOSIS — J0100 Acute maxillary sinusitis, unspecified: Secondary | ICD-10-CM | POA: Diagnosis not present

## 2020-11-20 DIAGNOSIS — E119 Type 2 diabetes mellitus without complications: Secondary | ICD-10-CM | POA: Diagnosis not present

## 2020-11-20 DIAGNOSIS — R0981 Nasal congestion: Secondary | ICD-10-CM | POA: Diagnosis not present

## 2020-11-20 DIAGNOSIS — H9201 Otalgia, right ear: Secondary | ICD-10-CM | POA: Diagnosis not present

## 2020-11-20 DIAGNOSIS — R059 Cough, unspecified: Secondary | ICD-10-CM | POA: Diagnosis not present

## 2020-11-20 MED ORDER — AMOXICILLIN-POT CLAVULANATE 875-125 MG PO TABS
1.0000 | ORAL_TABLET | Freq: Two times a day (BID) | ORAL | 0 refills | Status: DC
  Filled 2020-11-20: qty 14, 7d supply, fill #0

## 2020-11-20 MED ORDER — FLUTICASONE PROPIONATE 50 MCG/ACT NA SUSP
1.0000 | Freq: Every day | NASAL | 1 refills | Status: DC
  Filled 2020-11-20: qty 16, 60d supply, fill #0

## 2020-11-20 MED ORDER — DEXCOM G6 TRANSMITTER MISC
0 refills | Status: DC
  Filled 2020-11-20: qty 1, 90d supply, fill #0

## 2020-12-27 ENCOUNTER — Other Ambulatory Visit (HOSPITAL_COMMUNITY): Payer: Self-pay

## 2020-12-27 MED ORDER — DEXCOM G6 TRANSMITTER MISC
0 refills | Status: DC
  Filled 2020-12-27: qty 1, 90d supply, fill #0

## 2020-12-28 ENCOUNTER — Other Ambulatory Visit (HOSPITAL_COMMUNITY): Payer: Self-pay

## 2020-12-30 ENCOUNTER — Other Ambulatory Visit (HOSPITAL_COMMUNITY): Payer: Self-pay

## 2020-12-30 MED ORDER — DEXCOM G6 TRANSMITTER MISC
4 refills | Status: DC
  Filled 2020-12-30 – 2021-01-21 (×2): qty 1, 90d supply, fill #0
  Filled 2021-05-27: qty 1, 90d supply, fill #1
  Filled 2021-09-18: qty 1, 90d supply, fill #2

## 2021-01-20 DIAGNOSIS — M25562 Pain in left knee: Secondary | ICD-10-CM | POA: Diagnosis not present

## 2021-01-21 ENCOUNTER — Other Ambulatory Visit (HOSPITAL_COMMUNITY): Payer: Self-pay

## 2021-01-21 MED ORDER — DEXCOM G6 TRANSMITTER MISC
5 refills | Status: DC
  Filled 2021-01-21: qty 1, 90d supply, fill #0

## 2021-01-21 MED ORDER — ONDANSETRON 8 MG PO TBDP
8.0000 mg | ORAL_TABLET | Freq: Three times a day (TID) | ORAL | 1 refills | Status: DC
  Filled 2021-01-21: qty 21, 7d supply, fill #0

## 2021-01-21 MED ORDER — CARESTART COVID-19 HOME TEST VI KIT
PACK | 0 refills | Status: DC
  Filled 2021-01-21: qty 4, 4d supply, fill #0

## 2021-01-21 MED ORDER — DEXCOM G6 SENSOR MISC
4 refills | Status: DC
  Filled 2021-01-21: qty 9, 90d supply, fill #0

## 2021-01-21 MED ORDER — DEXCOM G6 RECEIVER DEVI
Freq: Every day | 1 refills | Status: AC
  Filled 2021-01-21: qty 1, 90d supply, fill #0

## 2021-01-22 ENCOUNTER — Other Ambulatory Visit (HOSPITAL_COMMUNITY): Payer: Self-pay

## 2021-01-28 DIAGNOSIS — M25562 Pain in left knee: Secondary | ICD-10-CM | POA: Diagnosis not present

## 2021-01-29 DIAGNOSIS — I10 Essential (primary) hypertension: Secondary | ICD-10-CM | POA: Diagnosis not present

## 2021-01-29 DIAGNOSIS — Z9641 Presence of insulin pump (external) (internal): Secondary | ICD-10-CM | POA: Diagnosis not present

## 2021-01-29 DIAGNOSIS — E119 Type 2 diabetes mellitus without complications: Secondary | ICD-10-CM | POA: Diagnosis not present

## 2021-01-30 ENCOUNTER — Other Ambulatory Visit (HOSPITAL_COMMUNITY): Payer: Self-pay

## 2021-01-30 DIAGNOSIS — M25562 Pain in left knee: Secondary | ICD-10-CM | POA: Diagnosis not present

## 2021-01-30 MED ORDER — MELOXICAM 15 MG PO TABS
15.0000 mg | ORAL_TABLET | Freq: Every day | ORAL | 1 refills | Status: DC
  Filled 2021-01-30: qty 30, 30d supply, fill #0

## 2021-02-04 ENCOUNTER — Encounter: Payer: Self-pay | Admitting: Cardiovascular Disease

## 2021-02-04 NOTE — Progress Notes (Addendum)
Cardiology Office Note:    Date:  02/05/2021   ID:  Duard Brady, DOB [birth year], MRN [id number]  PCP:  Pearson Grippe, MD   [facility] Providers Cardiologist:  Eugenia Pancoast to update primary MD,subspecialty MD or APP then REFRESH:1}    Referring MD: Dorisann Frames, MD   Chief Complaint  Patient presents with   Hypertension    Hyperlipidemia      Hyperlipidemia    June 22 ,2022   Alexis Campos is a 55 y.o. female with a hx of HTN, HLD. 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 Does the best she can while working .  Is not exercising regularly .   Has occasional left upper arm pain Lasts for a few minutes  Does not seem to be related to exertion .  Very strong family hx of CAD Mother, and 2 brothers  had cad in their 34s-50s.  Mother had DM,  brothers did not have DM , Brothers smoked cigarettes.    Uses the OmniPod for glucose control.  Dexicom  She has occasional episodes of hear flutters, Might last for a few minutes.  No lightheadedness,  Suggested  Lourena Simmonds  She had a heart catheterization in June, 2001 by Dr. Lavonne Chick.  She had normal coronary arteries at that time.  She also recalls having a stress test several years later.  She had an episode of SVT during the stress test and she received adenosine for treatment for that. She has not had any recurrent episodes of SVT.  She has been taking metoprolol which seems to keep that under good control.   Past Medical History:  Diagnosis Date   Anemia    Anemia 05/22/2014   Blood transfusion    Diabetes mellitus    diagnosed 1996-insulin and metformin   Endometriosis    Fibromyalgia    GERD (gastroesophageal reflux disease)    History of blood transfusion 12/09   [facility]   Hyperlipidemia    Hypertension     Neuromuscular disorder (HCC)    fibromyalgia   Seasonal allergies    recent bronchitis-chest xray/ct scan-granuloma noted present since 2008   Sinusitis    Splenomegaly 05/22/2014    Past Surgical History:  Procedure Laterality Date   ABDOMINAL SURGERY     LOA for endomeriosis   APPENDECTOMY  05/1995   CESAREAN SECTION     CHOLECYSTECTOMY  01/1995   COLONOSCOPY     lysis of adhesions  1996/ 2002   TONSILLECTOMY  09/1994    Current Medications: Current Meds  Medication Sig   Continuous Blood Gluc Receiver (DEXCOM G6 RECEIVER) DEVI Use as directed.   Continuous Blood Gluc Sensor (DEXCOM G6 SENSOR) MISC USE AS DIRECTED EVERY 10 DAYS   Continuous Blood Gluc Transmit (DEXCOM G6 TRANSMITTER) MISC Use as directed   Dulaglutide 4.5 MG/0.5ML SOPN INJECT 4.5MG  UNDER THE SKIN ONCE A WEEK   ergocalciferol (VITAMIN D2) 50000 UNITS capsule Take 50,000 Units by mouth once a week. On Wednesdays.   escitalopram (LEXAPRO) 20 MG tablet TAKE 1 TABLET BY MOUTH ONCE DAILY   fenofibrate (TRICOR) 145 MG tablet TAKE 1 TABLET BY MOUTH ONCE DAILY   Insulin Aspart (NOVOLOG Calvin) Inject 2-25 Units into the skin 3 (three) times  daily. Sliding scale insulin, depending on blood sugar reading & carbs eaten, and with snacks   insulin degludec (TRESIBA) 100 UNIT/ML FlexTouch Pen INJECT 15 UNITS SUBCUTANEOUSLY ONCE DAILY IN THE EVENING   Insulin Disposable Pump (OMNIPOD DASH 5 PACK PODS) MISC USE AS DIRECTED EVERY 2 DAYS   insulin lispro (HUMALOG) 100 UNIT/ML injection INJECT 100U/DAY UNDER THE SKIN VIA PUMP   metoprolol tartrate (LOPRESSOR) 50 MG tablet TAKE 3 TABLETS BY MOUTH TWICE DAILY.   ondansetron (ZOFRAN-ODT) 4 MG disintegrating tablet Take 1 tablet (4 mg total) by mouth every 8 (eight) hours as needed for nausea or vomiting.   ondansetron (ZOFRAN-ODT) 8 MG disintegrating tablet Take 1 tablet (8 mg total) on the tounge and allow to dissolve as needed by mouth 3 (three) times daily.   [DISCONTINUED]  icosapent Ethyl (VASCEPA) 1 g capsule Take 2 capsules (2 g total) by mouth 2 (two) times daily.   [DISCONTINUED] Vitamin D, Ergocalciferol, (DRISDOL) 1.25 MG (50000 UNIT) CAPS capsule TAKE 1 CAPSULE BY MOUTH ONCE A WEEK     Allergies:   Codeine, Duloxetine hcl, Lisinopril, and Metoclopramide hcl   Social History   Socioeconomic History   Marital status: Single    Spouse name: Not on file   Number of children: Not on file   Years of education: Not on file   Highest education level: Not on file  Occupational History   Not on file  Tobacco Use   Smoking status: Never   Smokeless tobacco: Never  Vaping Use   Vaping Use: Never used  Substance and Sexual Activity   Alcohol use: No   Drug use: No   Sexual activity: Not on file  Other Topics Concern   Not on file  Social History Narrative   Not on file   Social Determinants of Health   Financial Resource Strain: Not on file  Food Insecurity: Not on file  Transportation Needs: Not on file  Physical Activity: Not on file  Stress: Not on file  Social Connections: Not on file     Family History: The patient's family history includes Anemia in her mother; CAD in her brother and father; Coronary artery disease in her mother.  ROS:   Please see the history of present illness.     All other systems reviewed and are negative.  EKGs/Labs/Other Studies Reviewed:    The following studies were reviewed today:      Recent Labs: No results found for requested labs within last 8760 hours.  Recent Lipid Panel No results found for: CHOL, TRIG, HDL, CHOLHDL, VLDL, LDLCALC, LDLDIRECT   Risk Assessment/Calculations:           Physical Exam:    VS:  BP 124/62   Pulse 76   Ht 5\' 10"  (1.778 m)   Wt 230 lb 12.8 oz (104.7 kg)   SpO2 99%   BMI 33.12 kg/m     Wt Readings from Last 3 Encounters:  02/05/21 230 lb 12.8 oz (104.7 kg)  07/15/18 222 lb (100.7 kg)  04/23/16 210 lb (95.3 kg)     GEN:  Well nourished, well  developed in no acute distress HEENT: Normal NECK: No JVD; No carotid bruits LYMPHATICS: No lymphadenopathy CARDIAC: RRR, no murmurs, rubs, gallops RESPIRATORY:  Clear to auscultation without rales, wheezing or rhonchi  ABDOMEN: Soft, non-tender, non-distended MUSCULOSKELETAL:  No edema; No deformity  SKIN: Warm and dry NEUROLOGIC:  Alert and oriented x 3 PSYCHIATRIC:  Normal affect   ECG: February 05, 2021: Normal sinus rhythm at 76.  No ST or T wave changes.  ASSESSMENT:    1. Precordial pain    PLAN:      Unstable angina: Selena BattenKim presents for further evaluation of symptoms that could be due to unstable angina.  She is having left-sided arm pain.  This pain comes and goes and last for several minutes.  It is described as an ache.  It does not necessarily brought on by exercise.  It occurs at any time including rest and exertion. She has a history of poorly controlled diabetes.  She also has a very strong family history of coronary artery disease.  I would like to get a coronary CT angiogram for further evaluation of her coronary artery disease.  2.  Hyperlipidemia: Her triglyceride levels are over 400.  She is on fenofibrate.  She admits that her diabetes has not been well controlled over the past year.  She has been working 12-hour shifts and really has not been exercising much.  Will start on Vasepa 2 g p.o. twice daily.  We will check lipids and basic metabolic profile in 3 months.  Here again in 3 to 4 months for follow-up visit.        Medication Adjustments/Labs and Tests Ordered: Current medicines are reviewed at length with the patient today.  Concerns regarding medicines are outlined above.  Orders Placed This Encounter  Procedures   CT CORONARY MORPH W/CTA COR W/SCORE W/CA W/CM &/OR WO/CM   Lipid panel   Basic metabolic panel   EKG 12-Lead   Meds ordered this encounter  Medications   DISCONTD: icosapent Ethyl (VASCEPA) 1 g capsule    Sig: Take 2 capsules (2 g  total) by mouth 2 (two) times daily.    Dispense:  120 capsule    Refill:  11   icosapent Ethyl (VASCEPA) 1 g capsule    Sig: Take 2 capsules (2 g total) by mouth 2 (two) times daily.    Dispense:  120 capsule    Refill:  11    Patient Instructions    Get the Kardia Monitor by [employer]  Medication Instructions:  Your physician has recommended you make the following change in your medication:   START Vasepa 2grams twice a day *If you need a refill on your cardiac medications before your next appointment, please call your pharmacy*   Lab Work: Lipids, BMET 05/19/2021  Your physician recommends that you return for lab work in: 3 months on the day of or a few days before your office visit with Dr. Elease HashimotoNahser.   Testing/Procedures:  Your physician recommends that you have a Coronary CT performed.  This will be sent to your insurance company.  Once approved, the scheduling team will be in contact to get you scheduled.  Please see instructions below.      Follow-Up:  At [facility], you and your health needs are our priority.  As part of our continuing mission to provide you with exceptional heart care, we have created designated Provider Care Teams.  These Care Teams include your primary Cardiologist (physician) and Advanced Practice Providers (APPs -  Physician Assistants and Nurse Practitioners) who all work together to provide you with the care you need, when you need it.  We recommend signing up for the patient portal called "MyChart".  Sign up information is provided on this After Visit Summary.  MyChart is used to connect with patients for Virtual Visits (Telemedicine).  Patients are able to view lab/test  results, encounter notes, upcoming appointments, etc.  Non-urgent messages can be sent to your provider as well.   To learn more about what you can do with MyChart, go to [email or web address].    Your next appointment:     05/19/2021 at 9:20am  The format for your next  appointment:   In Person  Provider:   Kristeen Miss, MD    Signed, Kristeen Miss, MD  02/05/2021 5:33 PM    [facility]

## 2021-02-05 ENCOUNTER — Encounter: Payer: Self-pay | Admitting: Cardiovascular Disease

## 2021-02-05 ENCOUNTER — Other Ambulatory Visit: Payer: Self-pay

## 2021-02-05 ENCOUNTER — Ambulatory Visit: Payer: 59 | Admitting: Cardiovascular Disease

## 2021-02-05 ENCOUNTER — Other Ambulatory Visit (HOSPITAL_COMMUNITY): Payer: Self-pay

## 2021-02-05 VITALS — BP 124/62 | HR 76 | Ht 70.0 in | Wt 230.8 lb

## 2021-02-05 DIAGNOSIS — R072 Precordial pain: Secondary | ICD-10-CM | POA: Diagnosis not present

## 2021-02-05 MED ORDER — ICOSAPENT ETHYL 1 G PO CAPS: 2.0000 g | ORAL_CAPSULE | Freq: Two times a day (BID) | ORAL | 11 refills | Status: DC

## 2021-02-05 MED ORDER — ICOSAPENT ETHYL 1 G PO CAPS
2.0000 g | ORAL_CAPSULE | Freq: Two times a day (BID) | ORAL | 11 refills | Status: DC
  Filled 2021-02-05 (×2): qty 120, 30d supply, fill #0
  Filled 2021-03-17: qty 360, 90d supply, fill #1
  Filled 2021-09-18: qty 360, 90d supply, fill #2

## 2021-02-05 NOTE — Patient Instructions (Signed)
   Get the Kardia Monitor by [employer]  Medication Instructions:  Your physician has recommended you make the following change in your medication:   START Vasepa 2grams twice a day *If you need a refill on your cardiac medications before your next appointment, please call your pharmacy*   Lab Work: Lipids, BMET 05/19/2021  Your physician recommends that you return for lab work in: 3 months on the day of or a few days before your office visit with Dr. Elease Hashimoto.   Testing/Procedures:  Your physician recommends that you have a Coronary CT performed.  This will be sent to your insurance company.  Once approved, the scheduling team will be in contact to get you scheduled.  Please see instructions below.      Follow-Up:  At [facility], you and your health needs are our priority.  As part of our continuing mission to provide you with exceptional heart care, we have created designated Provider Care Teams.  These Care Teams include your primary Cardiologist (physician) and Advanced Practice Providers (APPs -  Physician Assistants and Nurse Practitioners) who all work together to provide you with the care you need, when you need it.  We recommend signing up for the patient portal called "MyChart".  Sign up information is provided on this After Visit Summary.  MyChart is used to connect with patients for Virtual Visits (Telemedicine).  Patients are able to view lab/test results, encounter notes, upcoming appointments, etc.  Non-urgent messages can be sent to your provider as well.   To learn more about what you can do with MyChart, go to [email or web address].    Your next appointment:     05/19/2021 at 9:20am  The format for your next appointment:   In Person  Provider:   Kristeen Miss, MD

## 2021-02-06 ENCOUNTER — Other Ambulatory Visit (HOSPITAL_COMMUNITY): Payer: Self-pay

## 2021-02-06 MED ORDER — ERGOCALCIFEROL 1.25 MG (50000 UT) PO CAPS
1.0000 | ORAL_CAPSULE | ORAL | 0 refills | Status: DC
  Filled 2021-02-06: qty 4, 28d supply, fill #0

## 2021-02-06 MED FILL — Insulin Degludec Soln Pen-Injector 100 Unit/ML: SUBCUTANEOUS | 60 days supply | Qty: 9 | Fill #0 | Status: AC

## 2021-02-06 MED FILL — Insulin Infusion Disposable Pump Reservoir: 30 days supply | Qty: 15 | Fill #0 | Status: AC

## 2021-02-18 ENCOUNTER — Telehealth (HOSPITAL_COMMUNITY): Payer: Self-pay | Admitting: *Deleted

## 2021-02-18 ENCOUNTER — Other Ambulatory Visit (HOSPITAL_COMMUNITY): Payer: Self-pay | Admitting: *Deleted

## 2021-02-18 DIAGNOSIS — Z01812 Encounter for preprocedural laboratory examination: Secondary | ICD-10-CM

## 2021-02-18 DIAGNOSIS — R072 Precordial pain: Secondary | ICD-10-CM

## 2021-02-18 NOTE — Telephone Encounter (Signed)
Attempted to call patient regarding upcoming cardiac CT appointment. °Left message on voicemail with name and callback number ° °Shabre Kreher RN Navigator Cardiac Imaging °[facility] °[phone] Office °[phone] Cell ° °

## 2021-02-19 ENCOUNTER — Other Ambulatory Visit (HOSPITAL_COMMUNITY): Payer: Self-pay

## 2021-02-19 ENCOUNTER — Other Ambulatory Visit (HOSPITAL_COMMUNITY): Payer: Self-pay | Admitting: *Deleted

## 2021-02-19 ENCOUNTER — Telehealth (HOSPITAL_COMMUNITY): Payer: Self-pay | Admitting: *Deleted

## 2021-02-19 ENCOUNTER — Other Ambulatory Visit
Admission: RE | Admit: 2021-02-19 | Discharge: 2021-02-19 | Disposition: A | Discharge status: Home / Self Care | Payer: 59 | Attending: Cardiovascular Disease | Admitting: Cardiovascular Disease

## 2021-02-19 DIAGNOSIS — R072 Precordial pain: Secondary | ICD-10-CM | POA: Diagnosis not present

## 2021-02-19 DIAGNOSIS — Z01812 Encounter for preprocedural laboratory examination: Secondary | ICD-10-CM | POA: Diagnosis not present

## 2021-02-19 LAB — BASIC METABOLIC PANEL
Anion gap: 10 (ref 5–15)
BUN: 30 mg/dL — ABNORMAL HIGH (ref 6–20)
CO2: 25 mmol/L (ref 22–32)
Calcium: 10 mg/dL (ref 8.9–10.3)
Chloride: 104 mmol/L (ref 98–111)
Creatinine, Ser: 1.32 mg/dL — ABNORMAL HIGH (ref 0.44–1.00)
GFR, Estimated: 48 mL/min — ABNORMAL LOW (ref >60)
Glucose, Bld: 78 mg/dL (ref 70–99)
Potassium: 4.1 mmol/L (ref 3.5–5.1)
Sodium: 139 mmol/L (ref 135–145)

## 2021-02-19 MED ORDER — IVABRADINE HCL 5 MG PO TABS
ORAL_TABLET | ORAL | 0 refills | Status: DC
  Filled 2021-02-19: qty 3, 2d supply, fill #0

## 2021-02-19 NOTE — Telephone Encounter (Signed)
Patient returning call regarding upcoming cardiac imaging study; pt verbalizes understanding of appt date/time, parking situation and where to check in, pre-test NPO status and medications ordered, and verified current allergies; name and call back number provided for further questions should they arise  Larey Brick RN Navigator Cardiac Imaging [facility] [phone] office [phone] cell  Patient is aware to pick up 15mg  ivabradine for test and to take it in addition to her daily metoprolol two hours prior to cardiac CT.  Pt will also get blood work prior to CT scan.

## 2021-02-20 ENCOUNTER — Ambulatory Visit
Admission: RE | Admit: 2021-02-20 | Discharge: 2021-02-20 | Disposition: A | Discharge status: Home / Self Care | Payer: 59 | Source: Ambulatory Visit | Attending: Cardiovascular Disease | Admitting: Cardiovascular Disease

## 2021-02-20 ENCOUNTER — Other Ambulatory Visit: Payer: Self-pay

## 2021-02-20 ENCOUNTER — Telehealth: Payer: Self-pay | Admitting: *Deleted

## 2021-02-20 DIAGNOSIS — R072 Precordial pain: Secondary | ICD-10-CM | POA: Diagnosis not present

## 2021-02-20 MED ORDER — ROSUVASTATIN CALCIUM 10 MG PO TABS
10.0000 mg | ORAL_TABLET | Freq: Every day | ORAL | 4 refills | Status: DC
  Filled 2021-02-20: qty 30, 30d supply, fill #0
  Filled 2021-03-17: qty 90, 90d supply, fill #1
  Filled 2021-09-18: qty 30, 30d supply, fill #2

## 2021-02-20 MED ORDER — METOPROLOL TARTRATE 5 MG/5ML IV SOLN
5.0000 mg | Freq: Once | INTRAVENOUS | Status: AC
  Administered 2021-02-20: 5 mg via INTRAVENOUS

## 2021-02-20 MED ORDER — IOHEXOL 300 MG/ML  SOLN: 100.0000 mL | Freq: Once | INTRAMUSCULAR | Status: DC | PRN

## 2021-02-20 MED ORDER — NITROGLYCERIN 0.4 MG SL SUBL
0.8000 mg | SUBLINGUAL_TABLET | Freq: Once | SUBLINGUAL | Status: AC
  Administered 2021-02-20: 0.8 mg via SUBLINGUAL

## 2021-02-20 MED ORDER — IOHEXOL 350 MG/ML SOLN
100.0000 mL | Freq: Once | INTRAVENOUS | Status: AC | PRN
  Administered 2021-02-20: 100 mL via INTRAVENOUS

## 2021-02-20 NOTE — Telephone Encounter (Signed)
Pt informed of result/advised of MD recommendation. Pt agreeable to plan. She will come fasting to her follow up appt in October w/ Dr. Elease Hashimoto. She will contact office if issues arise after staring the Rosuvastatin. She appreciates the call

## 2021-02-20 NOTE — Telephone Encounter (Signed)
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 This was 92nd percentile for age and sex matched control. Her LDL is 169 ( goal is 50-70) We have added Vasepa 2gm BID Lets also add rosuvastatin 10 mg a day Check lipids , bmp, ALT  in 3 months  If she does not tolerate the rosuvastatin, I would have a low threshold to refer her to lipid clinic for consideration

## 2021-02-20 NOTE — Progress Notes (Signed)
Patient tolerated CT well. Gave a bottle of water to drink after. Vital signs stable encourage to drink water throughout day.Reasons explained and verbalized understanding. Ambulated steady gait.

## 2021-02-21 ENCOUNTER — Other Ambulatory Visit (HOSPITAL_COMMUNITY): Payer: Self-pay

## 2021-02-21 MED FILL — Fenofibrate Tab 145 MG: ORAL | 90 days supply | Qty: 90 | Fill #0 | Status: AC

## 2021-02-21 MED FILL — Metoprolol Tartrate Tab 50 MG: ORAL | 90 days supply | Qty: 540 | Fill #0 | Status: AC

## 2021-02-27 ENCOUNTER — Other Ambulatory Visit (HOSPITAL_COMMUNITY): Payer: Self-pay

## 2021-02-27 DIAGNOSIS — E538 Deficiency of other specified B group vitamins: Secondary | ICD-10-CM | POA: Diagnosis not present

## 2021-02-27 DIAGNOSIS — E038 Other specified hypothyroidism: Secondary | ICD-10-CM | POA: Diagnosis not present

## 2021-02-27 DIAGNOSIS — Z9641 Presence of insulin pump (external) (internal): Secondary | ICD-10-CM | POA: Diagnosis not present

## 2021-02-27 DIAGNOSIS — E119 Type 2 diabetes mellitus without complications: Secondary | ICD-10-CM | POA: Diagnosis not present

## 2021-02-27 DIAGNOSIS — E7800 Pure hypercholesterolemia, unspecified: Secondary | ICD-10-CM | POA: Diagnosis not present

## 2021-02-27 DIAGNOSIS — I10 Essential (primary) hypertension: Secondary | ICD-10-CM | POA: Diagnosis not present

## 2021-02-27 DIAGNOSIS — E559 Vitamin D deficiency, unspecified: Secondary | ICD-10-CM | POA: Diagnosis not present

## 2021-02-27 MED ORDER — OMNIPOD 5 DEXG7G6 PODS GEN 5 MISC
6 refills | Status: DC
  Filled 2021-02-27 – 2021-04-15 (×4): qty 15, 30d supply, fill #0

## 2021-02-27 MED ORDER — OMNIPOD 5 DEXG7G6 INTRO GEN 5 KIT
PACK | 0 refills | Status: DC
  Filled 2021-02-27 – 2021-03-26 (×2): qty 1, 30d supply, fill #0
  Filled 2021-03-31 – 2021-04-02 (×2): qty 1, 90d supply, fill #0
  Filled 2021-04-15: qty 1, 30d supply, fill #0

## 2021-02-28 ENCOUNTER — Other Ambulatory Visit (HOSPITAL_COMMUNITY): Payer: Self-pay

## 2021-03-13 ENCOUNTER — Ambulatory Visit
Admission: EM | Admit: 2021-03-13 | Discharge: 2021-03-13 | Disposition: A | Discharge status: Home / Self Care | Payer: 59 | Attending: Emergency Medicine | Admitting: Emergency Medicine

## 2021-03-13 ENCOUNTER — Ambulatory Visit (INDEPENDENT_AMBULATORY_CARE_PROVIDER_SITE_OTHER): Payer: 59

## 2021-03-13 ENCOUNTER — Encounter: Payer: Self-pay | Admitting: Emergency Medicine

## 2021-03-13 ENCOUNTER — Other Ambulatory Visit: Payer: Self-pay

## 2021-03-13 DIAGNOSIS — R0602 Shortness of breath: Secondary | ICD-10-CM | POA: Diagnosis not present

## 2021-03-13 DIAGNOSIS — R059 Cough, unspecified: Secondary | ICD-10-CM

## 2021-03-13 DIAGNOSIS — U099 Post covid-19 condition, unspecified: Secondary | ICD-10-CM | POA: Diagnosis not present

## 2021-03-13 MED ORDER — BENZONATATE 100 MG PO CAPS: 100.0000 mg | ORAL_CAPSULE | Freq: Three times a day (TID) | ORAL | 0 refills | Status: DC

## 2021-03-13 MED ORDER — ALBUTEROL SULFATE HFA 108 (90 BASE) MCG/ACT IN AERS: 1.0000 | INHALATION_SPRAY | Freq: Four times a day (QID) | RESPIRATORY_TRACT | 0 refills | Status: DC | PRN

## 2021-03-13 NOTE — ED Triage Notes (Signed)
Pt presents today with continued SOB and cough. She reports that she was dx with Covid on 03/05/21 and has been taken out of work and is required to be seen by provider before returning. PCP will not see her and she she does not want to go to Emergency Room. She is also a diabetic, blood sugar now 119 via Omni pump.

## 2021-03-13 NOTE — ED Provider Notes (Signed)
CHIEF COMPLAINT:   Chief Complaint  Patient presents with   Shortness of Breath   Cough     SUBJECTIVE/HPI:   Shortness of Breath Associated symptoms: cough   Cough Associated symptoms: shortness of breath   A very pleasant 55 y.o.Female presents today with shortness of breath and cough after testing positive for COVID-19 on 03/05/2021.  Patient reports that last night she went to work and felt very tired.  Patient states that she felt worse after working.  Patient does work as a Engineer, civil (consulting).  Patient states that with her diabetes it has been difficult to control while having COVID and now as well.  Patient states that sometimes her glucose will increase to 400.  Patient states that her last A1c was 6.3.  Patient reports that cough is coming fits and states that she has had some fevers at night.  Patient reports throat soreness, cough and weakness at times.  Patient states "there is no way I can work like this".  Patient states that she is now on a leave of absence from work, but states that she tried to make an appointment with her PCP office who told her they were unable to see her in clinic and advised her to be seen in the urgent care setting or the emergency department.  Patient states that she did not feel as if she was acute enough to need to go to the emergency department, but does need medical forms filled out to help with her short-term disability as she does not feel like she is able to work at this time.  Not reporting me chest pain, dizziness, visual changes or severe abdominal pain.  She also does not report any unilateral weakness.   has a past medical history of Anemia, Anemia (05/22/2014), Blood transfusion, Diabetes mellitus, Endometriosis, Fibromyalgia, GERD (gastroesophageal reflux disease), History of blood transfusion (12/09), Hyperlipidemia, Hypertension, Neuromuscular disorder (HCC), Seasonal allergies, Sinusitis, and Splenomegaly (05/22/2014).  ROS:  Review of Systems   Respiratory:  Positive for cough and shortness of breath.   See Subjective/HPI Medications, Allergies and Problem List personally reviewed in Epic today OBJECTIVE:   Vitals:   03/13/21 1339  BP: (!) 150/83  Pulse: 77  Resp: 20  Temp: 98.4 F (36.9 C)  SpO2: 95%    Physical Exam   General: Appears well-developed and well-nourished. No acute distress.  HEENT Head: Normocephalic and atraumatic.   Ears: Hearing grossly intact, no drainage or visible deformity.  Nose: No nasal deviation.   Mouth/Throat: No stridor or tracheal deviation.   Eyes: Conjunctivae and EOM are normal. No eye drainage or scleral icterus bilaterally.  Neck: Normal range of motion, neck is supple.  Cardiovascular: Normal rate. Regular rhythm; no murmurs, gallops, or rubs.  Pulm/Chest: No respiratory distress. Breath sounds normal bilaterally without wheezes, rhonchi, or rales.  Neurological: Alert and oriented to person, place, and time.  Skin: Skin is warm and dry.  No rashes, lesions, abrasions or bruising noted to skin.   Psychiatric: Normal mood, affect, behavior, and thought content.   Vital signs and nursing note reviewed.   Patient stable and cooperative with examination. PROCEDURES:    LABS/X-RAYS/EKG/MEDS:   CLINICAL DATA:  Shortness of breath and productive cough.   EXAM: CHEST - 2 VIEW   COMPARISON:  April 30, 2012 and recent CT of the chest.   FINDINGS: Trachea midline. Cardiomediastinal contours and hilar structures are normal.   Granuloma in the LEFT mid chest as on recent cardiac evaluation.  No sign of pleural effusion.   No lobar consolidative changes.   On limited assessment no acute skeletal process.   IMPRESSION: No active cardiopulmonary disease.     Electronically Signed   By: Donzetta Kohut M.D.   On: 03/13/2021 15:19 MEDICAL DECISION MAKING:   Patient presents with shortness of breath and cough after testing positive for COVID-19 on 03/05/2021.  Patient  reports that last night she went to work and felt very tired.  Patient states that she felt worse after working.  Patient does work as a Engineer, civil (consulting).  Patient states that with her diabetes it has been difficult to control while having COVID and now as well.  Patient states that sometimes her glucose will increase to 400.  Patient states that her last A1c was 6.3.  Patient reports that cough is coming fits and states that she has had some fevers at night.  Patient reports throat soreness, cough and weakness at times.  Patient states "there is no way I can work like this".  Patient states that she is now on a leave of absence from work, but states that she tried to make an appointment with her PCP office who told her they were unable to see her in clinic and advised her to be seen in the urgent care setting or the emergency department.  Patient states that she did not feel as if she was acute enough to need to go to the emergency department, but does need medical forms filled out to help with her short-term disability as she does not feel like she is able to work at this time.  Not reporting me chest pain, dizziness, visual changes or severe abdominal pain.  She also does not report any unilateral weakness.  Chart review completed.  Chest x-ray reveals no pneumonia or pneumothorax.  Patient is stable in clinic today.  While patient does appear that she feels unwell, her examination was reassuring.  Patient may need longer to rest while recovering from COVID-19 given her history of immunocompromising conditions, but explained to the patient that we are unable to fill out short-term disability forms.  I personally tried to contact her PCP office on multiple occasions today and am still awaiting a callback to discuss further treatment options for the patient.  She states that she will also try to call her PCP office as well.  Return as needed.  Rx'd ProAir and Tessalon Perles to the patient's preferred pharmacy to help with  symptom management.  Strict emergency department precautions discussed.  Patient verbalized understanding and agreed with treatment plan.  Patient stable upon discharge. ASSESSMENT/PLAN:  1. Post-COVID syndrome  Meds ordered this encounter  Medications   benzonatate (TESSALON) 100 MG capsule    Sig: Take 1 capsule (100 mg total) by mouth every 8 (eight) hours.    Dispense:  21 capsule    Refill:  0    Order Specific Question:   Supervising Provider    Answer:   Loreli Dollar   albuterol (VENTOLIN HFA) 108 (90 Base) MCG/ACT inhaler    Sig: Inhale 1-2 puffs into the lungs every 6 (six) hours as needed (cough).    Dispense:  6.7 g    Refill:  0    Order Specific Question:   Supervising Provider    Answer:   Merrilee Jansky [id number]    Instructions about new medications and side effects provided.  Plan:   Discharge Instructions      Follow up with  your PCP with continued symptoms. If symptoms acutely worsen go to the ED immediately.       A copy of these instructions have been given to the patient or responsible adult who demonstrated the ability to learn, asked appropriate questions, and verbalized understanding of the plan of care.  There were no barriers to learning identified.    Amalia Greenhouse, FNP-C 03/13/21  This note was partially made with the aid of speech-to-text dictation; typographical errors are not intentional.    Amalia Greenhouse, FNP 03/13/21 1545

## 2021-03-13 NOTE — Discharge Instructions (Addendum)
Follow up with your PCP with continued symptoms. If symptoms acutely worsen go to the ED immediately.

## 2021-03-17 ENCOUNTER — Other Ambulatory Visit (HOSPITAL_COMMUNITY): Payer: Self-pay

## 2021-03-17 MED FILL — Insulin Infusion Disposable Pump Reservoir: 30 days supply | Qty: 15 | Fill #1 | Status: AC

## 2021-03-17 MED FILL — Escitalopram Oxalate Tab 20 MG (Base Equiv): ORAL | 90 days supply | Qty: 90 | Fill #0 | Status: AC

## 2021-03-20 ENCOUNTER — Other Ambulatory Visit (HOSPITAL_COMMUNITY): Payer: Self-pay

## 2021-03-26 ENCOUNTER — Other Ambulatory Visit (HOSPITAL_COMMUNITY): Payer: Self-pay

## 2021-03-31 ENCOUNTER — Other Ambulatory Visit (HOSPITAL_COMMUNITY): Payer: Self-pay

## 2021-03-31 DIAGNOSIS — E1165 Type 2 diabetes mellitus with hyperglycemia: Secondary | ICD-10-CM | POA: Diagnosis not present

## 2021-03-31 DIAGNOSIS — R0602 Shortness of breath: Secondary | ICD-10-CM | POA: Diagnosis not present

## 2021-03-31 DIAGNOSIS — I10 Essential (primary) hypertension: Secondary | ICD-10-CM | POA: Diagnosis not present

## 2021-03-31 DIAGNOSIS — E538 Deficiency of other specified B group vitamins: Secondary | ICD-10-CM | POA: Diagnosis not present

## 2021-03-31 DIAGNOSIS — Z9641 Presence of insulin pump (external) (internal): Secondary | ICD-10-CM | POA: Diagnosis not present

## 2021-03-31 DIAGNOSIS — E119 Type 2 diabetes mellitus without complications: Secondary | ICD-10-CM | POA: Diagnosis not present

## 2021-03-31 DIAGNOSIS — E038 Other specified hypothyroidism: Secondary | ICD-10-CM | POA: Diagnosis not present

## 2021-03-31 DIAGNOSIS — E7800 Pure hypercholesterolemia, unspecified: Secondary | ICD-10-CM | POA: Diagnosis not present

## 2021-03-31 DIAGNOSIS — E559 Vitamin D deficiency, unspecified: Secondary | ICD-10-CM | POA: Diagnosis not present

## 2021-03-31 MED ORDER — TRESIBA FLEXTOUCH 100 UNIT/ML ~~LOC~~ SOPN
15.0000 [IU] | PEN_INJECTOR | Freq: Every day | SUBCUTANEOUS | 6 refills | Status: DC
  Filled 2021-03-31: qty 9, 60d supply, fill #0
  Filled 2021-09-18: qty 9, 60d supply, fill #1

## 2021-04-02 ENCOUNTER — Other Ambulatory Visit (HOSPITAL_COMMUNITY): Payer: Self-pay

## 2021-04-03 ENCOUNTER — Other Ambulatory Visit (HOSPITAL_COMMUNITY): Payer: Self-pay

## 2021-04-04 ENCOUNTER — Other Ambulatory Visit (HOSPITAL_COMMUNITY): Payer: Self-pay

## 2021-04-06 ENCOUNTER — Other Ambulatory Visit (HOSPITAL_COMMUNITY): Payer: Self-pay

## 2021-04-10 ENCOUNTER — Other Ambulatory Visit (HOSPITAL_COMMUNITY): Payer: Self-pay

## 2021-04-11 ENCOUNTER — Other Ambulatory Visit (HOSPITAL_COMMUNITY): Payer: Self-pay

## 2021-04-14 ENCOUNTER — Other Ambulatory Visit (HOSPITAL_COMMUNITY): Payer: Self-pay

## 2021-04-14 MED FILL — Insulin Lispro Inj Soln 100 Unit/ML: INTRAMUSCULAR | 90 days supply | Qty: 90 | Fill #0 | Status: AC

## 2021-04-15 ENCOUNTER — Other Ambulatory Visit (HOSPITAL_COMMUNITY): Payer: Self-pay

## 2021-04-15 MED FILL — Insulin Infusion Disposable Pump Reservoir: 30 days supply | Qty: 15 | Fill #2 | Status: CN

## 2021-04-16 ENCOUNTER — Other Ambulatory Visit (HOSPITAL_COMMUNITY): Payer: Self-pay

## 2021-04-16 MED FILL — Dulaglutide Soln Auto-injector 4.5 MG/0.5ML: SUBCUTANEOUS | 84 days supply | Qty: 6 | Fill #0 | Status: AC

## 2021-05-17 ENCOUNTER — Encounter: Payer: Self-pay | Admitting: Cardiovascular Disease

## 2021-05-17 NOTE — Progress Notes (Signed)
Cardiology Office Note:    Date:  05/19/2021   ID:  Alexis Campos, DOB Oct 17, 1965, MRN [id number]  PCP:  Jani Gravel, MD   [facility] Providers Cardiologist:  Kathrynn Humble to update primary MD,subspecialty MD or APP then REFRESH:1}    Referring MD: Jani Gravel, MD   Chief Complaint  Patient presents with   Chest Pain    June 22 ,2022   Alexis Campos is a 55 y.o. female with a hx of HTN, HLD. 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 Does the best she can while working .  Is not exercising regularly .   Has occasional left upper arm pain Lasts for a few minutes  Does not seem to be related to exertion .  Very strong family hx of CAD Mother, and 2 brothers  had cad in their 41s-50s.  Mother had DM,  brothers did not have DM , Brothers smoked cigarettes.    Uses the OmniPod for glucose control.  Dexicom  She has occasional episodes of hear flutters, Might last for a few minutes.  No lightheadedness,  Suggested  Jodelle Red  She had a heart catheterization in June, 2001 by Dr. Janene Madeira.  She had normal coronary arteries at that time.  She also recalls having a stress test several years later.  She had an episode of SVT during the stress test and she received adenosine for treatment for that. She has not had any recurrent episodes of SVT.  She has been taking metoprolol which seems to keep that under good control.   Oct. 3, 2022 Alexis Campos is seen for follow up of an episode of chest pain several months ago. Coronary CT angio revealed : Coronary calcium score of 59.8. This was 92nd percentile for age and sex matched control.  LM: normal LAD :  mild calcified plaque LCx: normal RCA :  mild stenosis  We discussed lipid control and diabetic control.  Lipids  She is  on crestor, fenofibrate, and vasepa   She is starting a very intense diet, exercise, weight loss program with a Physiological scientist.  She is looking forward to seeing how her lipids improve over the next 3 months.  We will plan on checking her lipids again in 3 months from now.   Developed Covid in July. Taste is still not normal .  Was out of work for 6 weeks.  Had lots of hyperglycemia     Past Medical History:  Diagnosis Date   Anemia    Anemia 05/22/2014   Blood transfusion    Diabetes mellitus    diagnosed 1996-insulin and metformin   Endometriosis    Fibromyalgia    GERD (gastroesophageal reflux disease)    History of blood transfusion 12/09   [facility]   Hyperlipidemia    Hypertension    Neuromuscular disorder ([place])    fibromyalgia   Seasonal allergies    recent bronchitis-chest xray/ct scan-granuloma noted present since 2008   Sinusitis    Splenomegaly 05/22/2014    Past Surgical History:  Procedure Laterality Date   ABDOMINAL SURGERY     LOA for endomeriosis   APPENDECTOMY  05/1995   CESAREAN SECTION     CHOLECYSTECTOMY  01/1995   COLONOSCOPY  lysis of adhesions  1996/ 2002   TONSILLECTOMY  09/1994    Current Medications: Current Meds  Medication Sig   Continuous Blood Gluc Receiver (DEXCOM G6 RECEIVER) DEVI Use as directed.   Continuous Blood Gluc Sensor (DEXCOM G6 SENSOR) MISC USE AS DIRECTED EVERY 10 DAYS   Continuous Blood Gluc Transmit (DEXCOM G6 TRANSMITTER) MISC Use as directed   Dulaglutide 4.5 MG/0.5ML SOPN INJECT 4.$RemoveBefor'5MG'gxUvlLZToEMy$  UNDER THE SKIN ONCE A WEEK   ergocalciferol (VITAMIN D2) 50000 UNITS capsule Take 50,000 Units by mouth once a week. On Wednesdays.   escitalopram (LEXAPRO) 20 MG tablet TAKE 1 TABLET BY MOUTH ONCE DAILY   fenofibrate (TRICOR) 145 MG tablet TAKE 1 TABLET BY MOUTH ONCE DAILY   icosapent Ethyl (VASCEPA) 1 g capsule Take 2 capsules (2 g total) by mouth 2 (two) times daily.   insulin degludec (TRESIBA FLEXTOUCH) 100 UNIT/ML FlexTouch Pen  Inject 15 Units into the skin daily.   insulin degludec (TRESIBA) 100 UNIT/ML FlexTouch Pen INJECT 15 UNITS SUBCUTANEOUSLY ONCE DAILY IN THE EVENING   Insulin Disposable Pump (OMNIPOD 5 G6 INTRO, GEN 5,) KIT Use as directed   Insulin Disposable Pump (OMNIPOD 5 G6 POD, GEN 5,) MISC Use as directed every 2 days   insulin lispro (HUMALOG) 100 UNIT/ML injection Inject up to 100 units/day under the skin via pump.   metoprolol tartrate (LOPRESSOR) 50 MG tablet TAKE 3 TABLETS BY MOUTH TWICE DAILY.   ondansetron (ZOFRAN-ODT) 4 MG disintegrating tablet Take 1 tablet (4 mg total) by mouth every 8 (eight) hours as needed for nausea or vomiting.   rosuvastatin (CRESTOR) 10 MG tablet Take 1 tablet (10 mg total) by mouth daily.     Allergies:   Codeine, Duloxetine hcl, Lisinopril, and Metoclopramide hcl   Social History   Socioeconomic History   Marital status: Single    Spouse name: Not on file   Number of children: Not on file   Years of education: Not on file   Highest education level: Not on file  Occupational History   Not on file  Tobacco Use   Smoking status: Never   Smokeless tobacco: Never  Vaping Use   Vaping Use: Never used  Substance and Sexual Activity   Alcohol use: No   Drug use: No   Sexual activity: Not on file  Other Topics Concern   Not on file  Social History Narrative   Not on file   Social Determinants of Health   Financial Resource Strain: Not on file  Food Insecurity: Not on file  Transportation Needs: Not on file  Physical Activity: Not on file  Stress: Not on file  Social Connections: Not on file     Family History: The patient's family history includes Anemia in her mother; CAD in her brother and father; Coronary artery disease in her mother.  ROS:   Please see the history of present illness.     All other systems reviewed and are negative.  EKGs/Labs/Other Studies Reviewed:    The following studies were reviewed today:   Recent Labs: 02/19/2021:  BUN 30; Creatinine, Ser 1.32; Potassium 4.1; Sodium 139  Recent Lipid Panel No results found for: CHOL, TRIG, HDL, CHOLHDL, VLDL, LDLCALC, LDLDIRECT   Risk Assessment/Calculations:           Physical Exam:     Physical Exam: Blood pressure 138/72, pulse 71, height $RemoveBe'5\' 10"'vPQUKwbjV$  (1.778 m), weight 227 lb 4.8 oz (103.1 kg), last menstrual period 06/14/2018, SpO2 97 %.  GEN:  mildly obese female, NAD  HEENT: Normal NECK: No JVD; No carotid bruits LYMPHATICS: No lymphadenopathy CARDIAC: RRR , no murmurs, rubs, gallops RESPIRATORY:  Clear to auscultation without rales, wheezing or rhonchi  ABDOMEN: Soft, non-tender, non-distended MUSCULOSKELETAL:  No edema; No deformity  SKIN: Warm and dry NEUROLOGIC:  Alert and oriented x 3    ECG:    ASSESSMENT:    1. Precordial pain   2. Hyperlipidemia, unspecified hyperlipidemia type     PLAN:      Chest pain: She had a coronary CT angiogram which revealed only mild irregularities.  She does have some coronary calcium.  We will be aggressive with her lipid-lowering and weight loss program.  2.  Hyperlipidemia: We have drawn lipids and ALT today.  Anticipate rechecking again in 3 months.  She is planning on getting a personal trainer to help improve her diet and exercise program.  I will see her again in 6 months for follow-up visit.       Medication Adjustments/Labs and Tests Ordered: Current medicines are reviewed at length with the patient today.  Concerns regarding medicines are outlined above.  Orders Placed This Encounter  Procedures   Lipid panel   ALT    No orders of the defined types were placed in this encounter.   Patient Instructions  Medication Instructions:  Your physician recommends that you continue on your current medications as directed. Please refer to the Current Medication list given to you today.  *If you need a refill on your cardiac medications before your next appointment, please call your  pharmacy*   Lab Work: IN 3 MONTHS: Fasting lipid panel and ALT If you have labs (blood work) drawn today and your tests are completely normal, you will receive your results only by: [place] (if you have MyChart) OR A paper copy in the mail If you have any lab test that is abnormal or we need to change your treatment, we will call you to review the results.   Testing/Procedures: NONE   Follow-Up: At [facility], you and your health needs are our priority.  As part of our continuing mission to provide you with exceptional heart care, we have created designated Provider Care Teams.  These Care Teams include your primary Cardiologist (physician) and Advanced Practice Providers (APPs -  Physician Assistants and Nurse Practitioners) who all work together to provide you with the care you need, when you need it.   Your next appointment:   6 month(s)  The format for your next appointment:   In Person  Provider:   You may see Mertie Moores, MD or one of the following Advanced Practice Providers on your designated Care Team:   Richardson Dopp, PA-C Robbie Lis, [place]    Signed, Mertie Moores, MD  05/19/2021 10:01 AM    [place]

## 2021-05-19 ENCOUNTER — Ambulatory Visit: Payer: 59 | Admitting: Cardiovascular Disease

## 2021-05-19 ENCOUNTER — Other Ambulatory Visit: Payer: 59 | Admitting: *Deleted

## 2021-05-19 ENCOUNTER — Other Ambulatory Visit: Payer: Self-pay

## 2021-05-19 ENCOUNTER — Encounter: Payer: Self-pay | Admitting: Cardiovascular Disease

## 2021-05-19 VITALS — BP 138/72 | HR 71 | Ht 70.0 in | Wt 227.3 lb

## 2021-05-19 DIAGNOSIS — R072 Precordial pain: Secondary | ICD-10-CM | POA: Diagnosis not present

## 2021-05-19 DIAGNOSIS — E785 Hyperlipidemia, unspecified: Secondary | ICD-10-CM | POA: Diagnosis not present

## 2021-05-19 LAB — LIPID PANEL
Chol/HDL Ratio: 7 ratio — ABNORMAL HIGH (ref 0.0–4.4)
Cholesterol, Total: 202 mg/dL — ABNORMAL HIGH (ref 100–199)
HDL: 29 mg/dL — ABNORMAL LOW (ref >39)
LDL Chol Calc (NIH): 118 mg/dL — ABNORMAL HIGH (ref 0–99)
Triglycerides: 314 mg/dL — ABNORMAL HIGH (ref 0–149)
VLDL Cholesterol Cal: 55 mg/dL — ABNORMAL HIGH (ref 5–40)

## 2021-05-19 LAB — BASIC METABOLIC PANEL
BUN/Creatinine Ratio: 24 — ABNORMAL HIGH (ref 9–23)
BUN: 30 mg/dL — ABNORMAL HIGH (ref 6–24)
CO2: 21 mmol/L (ref 20–29)
Calcium: 9.1 mg/dL (ref 8.7–10.2)
Chloride: 96 mmol/L (ref 96–106)
Creatinine, Ser: 1.26 mg/dL — ABNORMAL HIGH (ref 0.57–1.00)
Glucose: 390 mg/dL — ABNORMAL HIGH (ref 70–99)
Potassium: 4.3 mmol/L (ref 3.5–5.2)
Sodium: 132 mmol/L — ABNORMAL LOW (ref 134–144)
eGFR: 50 mL/min/{1.73_m2} — ABNORMAL LOW (ref >59)

## 2021-05-19 NOTE — Patient Instructions (Signed)
Medication Instructions:  Your physician recommends that you continue on your current medications as directed. Please refer to the Current Medication list given to you today.  *If you need a refill on your cardiac medications before your next appointment, please call your pharmacy*   Lab Work: IN 3 MONTHS: Fasting lipid panel and ALT If you have labs (blood work) drawn today and your tests are completely normal, you will receive your results only by: MyChart Message (if you have MyChart) OR A paper copy in the mail If you have any lab test that is abnormal or we need to change your treatment, we will call you to review the results.   Testing/Procedures: NONE   Follow-Up: At [facility], you and your health needs are our priority.  As part of our continuing mission to provide you with exceptional heart care, we have created designated Provider Care Teams.  These Care Teams include your primary Cardiologist (physician) and Advanced Practice Providers (APPs -  Physician Assistants and Nurse Practitioners) who all work together to provide you with the care you need, when you need it.   Your next appointment:   6 month(s)  The format for your next appointment:   In Person  Provider:   You may see Kristeen Miss, MD or one of the following Advanced Practice Providers on your designated Care Team:   Tereso Newcomer, PA-C [place], [place]

## 2021-05-27 ENCOUNTER — Other Ambulatory Visit (HOSPITAL_COMMUNITY): Payer: Self-pay

## 2021-05-27 MED ORDER — METOPROLOL TARTRATE 50 MG PO TABS
150.0000 mg | ORAL_TABLET | Freq: Two times a day (BID) | ORAL | 4 refills | Status: DC
  Filled 2021-05-27: qty 540, 90d supply, fill #0
  Filled 2021-09-18: qty 540, 90d supply, fill #1

## 2021-05-27 MED FILL — Fenofibrate Tab 145 MG: ORAL | 90 days supply | Qty: 90 | Fill #1 | Status: AC

## 2021-05-28 ENCOUNTER — Other Ambulatory Visit (HOSPITAL_COMMUNITY): Payer: Self-pay

## 2021-05-28 DIAGNOSIS — M79671 Pain in right foot: Secondary | ICD-10-CM | POA: Diagnosis not present

## 2021-05-28 MED ORDER — MELOXICAM 7.5 MG PO TABS
7.5000 mg | ORAL_TABLET | Freq: Every day | ORAL | 0 refills | Status: DC | PRN
  Filled 2021-05-28: qty 30, 30d supply, fill #0

## 2021-05-29 ENCOUNTER — Other Ambulatory Visit (HOSPITAL_COMMUNITY): Payer: Self-pay

## 2021-05-30 ENCOUNTER — Other Ambulatory Visit (HOSPITAL_COMMUNITY): Payer: Self-pay

## 2021-05-30 MED ORDER — VITAMIN D (ERGOCALCIFEROL) 1.25 MG (50000 UNIT) PO CAPS
50000.0000 [IU] | ORAL_CAPSULE | ORAL | 6 refills | Status: DC
  Filled 2021-05-30: qty 4, 28d supply, fill #0

## 2021-06-05 ENCOUNTER — Other Ambulatory Visit (HOSPITAL_COMMUNITY): Payer: Self-pay

## 2021-06-06 ENCOUNTER — Encounter: Payer: Self-pay | Admitting: *Deleted

## 2021-06-06 ENCOUNTER — Ambulatory Visit: Payer: 59 | Admitting: Podiatry

## 2021-06-06 ENCOUNTER — Ambulatory Visit (INDEPENDENT_AMBULATORY_CARE_PROVIDER_SITE_OTHER): Payer: 59

## 2021-06-06 ENCOUNTER — Other Ambulatory Visit: Payer: Self-pay

## 2021-06-06 DIAGNOSIS — M84374A Stress fracture, right foot, initial encounter for fracture: Secondary | ICD-10-CM

## 2021-06-06 DIAGNOSIS — M79671 Pain in right foot: Secondary | ICD-10-CM

## 2021-06-09 ENCOUNTER — Other Ambulatory Visit (HOSPITAL_COMMUNITY): Payer: Self-pay

## 2021-06-12 ENCOUNTER — Telehealth: Payer: Self-pay | Admitting: *Deleted

## 2021-06-12 NOTE — Telephone Encounter (Signed)
"  I was seen in your office last Friday.  He took me out of work for Friday, Saturday, and Sunday nights to return to work tomorrow night.  I'm still having quite a bit of pain in the foot even with the brace on.  I'm getting a little bit worried about that.  I'm just worried, I don't think I'll be able to do three 12 hour shifts on my feet.  I'm a floor nurse.  I want to see what he says that we should do from here.  I don't want to make the small fracture worse.  If' you'll just call me."

## 2021-06-13 ENCOUNTER — Encounter: Payer: Self-pay | Admitting: *Deleted

## 2021-06-13 NOTE — Telephone Encounter (Signed)
Will you call the patient and let her know that we can keep her out of work or give her a note for half shifts only.  I do not think her work will allow half shift only.  Thanks, Dr. Logan Bores

## 2021-06-13 NOTE — Telephone Encounter (Signed)
I am returning your call.  Dr. Logan Bores said he can give you a note to be off or a note requesting you to only work half a shift.  "They won't allow me to work half a shift that is the problem."  I will send you a letter, it will be in MyChart.  I will write it for you to be out for two weekends.  "I'll call my FMLA and have them to send you the forms to complete and I'll stop by on Monday to pay for it."

## 2021-06-22 NOTE — Progress Notes (Signed)
   HPI: 55 y.o. female PMHx diabetes mellitus presenting today as a new patient for evaluation of pain and tenderness to the right foot this been going on for about 3-4 weeks now.  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  She has been experiencing some sharp tightness and pain with walking to her right foot.  She denies a history of injury.  She presents for further treatment and evaluation  Past Medical History:  Diagnosis Date   Anemia    Anemia 05/22/2014   Blood transfusion    Diabetes mellitus    diagnosed 1996-insulin and metformin   Endometriosis    Fibromyalgia    GERD (gastroesophageal reflux disease)    History of blood transfusion 12/09   [facility]   Hyperlipidemia    Hypertension    Neuromuscular disorder (HCC)    fibromyalgia   Seasonal allergies    recent bronchitis-chest xray/ct scan-granuloma noted present since 2008   Sinusitis    Splenomegaly 05/22/2014     Physical Exam: General: The patient is alert and oriented x3 in no acute distress.  Dermatology: Skin is warm, dry and supple bilateral lower extremities. Negative for open lesions or macerations.  Vascular: Palpable pedal pulses bilaterally. No edema or erythema noted. Capillary refill within normal limits.  Neurological: Epicritic and protective threshold grossly intact bilaterally.   Musculoskeletal Exam: Range of motion within normal limits to all pedal and ankle joints bilateral. Muscle strength 5/5 in all groups bilateral.  Associated tenderness to palpation right fourth metatarsal  Radiographic Exam:  Normal osseous mineralization. Joint spaces preserved.  There is a very subtle transverse stress reaction fracture along the fourth metatarsal of the right foot.  Nondisplaced.  Assessment: 1.  Stress reaction fracture fourth metatarsal right foot   Plan of Care:  1. Patient evaluated. X-Rays reviewed.  2.  Recommend to the patient conservative treatment modalities.  Recommend ice rest  compression and elevation. 3.  Cam boot dispensed.  Weightbearing as tolerated 4.  Patient has meloxicam at home.  Recommend that she takes meloxicam daily as needed 5.  A note for work was provided today.  No work x1 week to allow the foot to rest 6.  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 Felecia Shelling, DPM    [address] [place], [place] [address]                Office [phone]  Fax [phone]

## 2021-06-24 ENCOUNTER — Ambulatory Visit (INDEPENDENT_AMBULATORY_CARE_PROVIDER_SITE_OTHER): Payer: 59

## 2021-06-24 ENCOUNTER — Encounter: Payer: Self-pay | Admitting: Podiatry

## 2021-06-24 ENCOUNTER — Ambulatory Visit: Payer: 59 | Admitting: Podiatry

## 2021-06-24 ENCOUNTER — Other Ambulatory Visit: Payer: Self-pay

## 2021-06-24 DIAGNOSIS — M778 Other enthesopathies, not elsewhere classified: Secondary | ICD-10-CM | POA: Diagnosis not present

## 2021-06-24 DIAGNOSIS — M84374A Stress fracture, right foot, initial encounter for fracture: Secondary | ICD-10-CM

## 2021-06-24 NOTE — Progress Notes (Signed)
   HPI: 55 y.o. female PMHx diabetes mellitus presenting today for follow-up 6 evaluation of pain and tenderness to the right foot this been going on for about 3-4 weeks now.  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  She has been experiencing some sharp tightness and pain with walking to her right foot.  Since last visit on 06/07/2019 she has not returned to work yet.  Patient states that since last visit she has had an increase amount of pain with swelling and tenderness to the right foot.  There is been no improvement despite being in the cam boot.  She is concerned about returning to work 12-hour shifts with the condition of her foot.  Patient is also concerned due to history of diabetes mellitus.  Past Medical History:  Diagnosis Date   Anemia    Anemia 05/22/2014   Blood transfusion    Diabetes mellitus    diagnosed 1996-insulin and metformin   Endometriosis    Fibromyalgia    GERD (gastroesophageal reflux disease)    History of blood transfusion 12/09   [facility]   Hyperlipidemia    Hypertension    Neuromuscular disorder (HCC)    fibromyalgia   Seasonal allergies    recent bronchitis-chest xray/ct scan-granuloma noted present since 2008   Sinusitis    Splenomegaly 05/22/2014     Physical Exam: General: The patient is alert and oriented x3 in no acute distress.  Dermatology: Skin is warm, dry and supple bilateral lower extremities. Negative for open lesions or macerations.  Vascular: Palpable pedal pulses bilaterally. No edema or erythema noted. Capillary refill within normal limits.  Neurological: Epicritic and protective threshold grossly intact bilaterally.   Musculoskeletal Exam: Initially the pain seemed to be localized around the fourth metatarsal of the right foot.  Today there is an increased amount of edema with pain to the lateral column of the right foot.  Radiographic Exam:  Normal osseous mineralization. Joint spaces preserved.  Unable to identify  the stress reaction fracture that was evident last visit  Assessment: 1.  Stress reaction fracture fourth metatarsal right foot 2.  Inflammatory capsulitis versus possible Charcot neuroarthropathy right foot   Plan of Care:  1. Patient evaluated. X-Rays reviewed.  2.  At the moment we are going to continue aggressive conservative treatment 3.  Refrain from work at least until next follow-up 4.  Continue cam boot daily.  Reduce activity with minimal weightbearing 5.  Ace wrap was dispensed.  Recommend Ace wrap daily 6.  Recommend ice daily 7.  Return to clinic in 3 weeks.  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 Felecia Shelling, DPM    [address] [place], [place] [address]                Office [phone]  Fax [phone]

## 2021-06-27 DIAGNOSIS — M79676 Pain in unspecified toe(s): Secondary | ICD-10-CM

## 2021-07-01 ENCOUNTER — Telehealth: Payer: Self-pay | Admitting: *Deleted

## 2021-07-01 NOTE — Telephone Encounter (Signed)
Patient called and left a message that [employer] said they did not receive all of the completed forms.  I attempted to return her call.  I left her a message that the completed form was sent on Friday and that I faxed it again today.  I asked her to give [place] a call if she has any additional questions.

## 2021-07-03 DIAGNOSIS — H04123 Dry eye syndrome of bilateral lacrimal glands: Secondary | ICD-10-CM | POA: Diagnosis not present

## 2021-07-03 DIAGNOSIS — H40013 Open angle with borderline findings, low risk, bilateral: Secondary | ICD-10-CM | POA: Diagnosis not present

## 2021-07-03 DIAGNOSIS — E119 Type 2 diabetes mellitus without complications: Secondary | ICD-10-CM | POA: Diagnosis not present

## 2021-07-03 DIAGNOSIS — H2513 Age-related nuclear cataract, bilateral: Secondary | ICD-10-CM | POA: Diagnosis not present

## 2021-07-08 ENCOUNTER — Ambulatory Visit: Payer: 59 | Admitting: Podiatry

## 2021-07-15 ENCOUNTER — Ambulatory Visit: Payer: 59 | Admitting: Podiatry

## 2021-07-15 ENCOUNTER — Other Ambulatory Visit: Payer: Self-pay

## 2021-07-15 ENCOUNTER — Encounter: Payer: Self-pay | Admitting: *Deleted

## 2021-07-15 DIAGNOSIS — M778 Other enthesopathies, not elsewhere classified: Secondary | ICD-10-CM | POA: Diagnosis not present

## 2021-07-15 DIAGNOSIS — M84374A Stress fracture, right foot, initial encounter for fracture: Secondary | ICD-10-CM | POA: Diagnosis not present

## 2021-07-15 NOTE — Progress Notes (Signed)
   HPI: 55 y.o. female PMHx diabetes mellitus presenting today for follow-up evaluation of pain and tenderness to the right foot this been going on for about 3-4 weeks now.  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  She has been experiencing some sharp tightness and pain with walking to her right foot.  Since visit on 06/06/2021 she has not returned to work yet.  Patient continues to have pain and tenderness associated to the right foot and ankle.  She says that the ankle gives out on her.  There is been no improvement despite being in the cam boot.  She is concerned about returning to work.  She is also concerned because of her diabetes mellitus and family history of amputations in the family.  The last few visits we have been concerned for possible acute onset of Charcot neuroarthropathy.  She presents for further treatment and evaluation  Past Medical History:  Diagnosis Date   Anemia    Anemia 05/22/2014   Blood transfusion    Diabetes mellitus    diagnosed 1996-insulin and metformin   Endometriosis    Fibromyalgia    GERD (gastroesophageal reflux disease)    History of blood transfusion 12/09   [facility]   Hyperlipidemia    Hypertension    Neuromuscular disorder (HCC)    fibromyalgia   Seasonal allergies    recent bronchitis-chest xray/ct scan-granuloma noted present since 2008   Sinusitis    Splenomegaly 05/22/2014     Physical Exam: General: The patient is alert and oriented x3 in no acute distress.  Dermatology: Skin is warm, dry and supple bilateral lower extremities. Negative for open lesions or macerations.  Vascular: Palpable pedal pulses bilaterally.  Edema noted right midtarsal joint and lateral aspect of the foot.  Capillary refill within normal limits.  Neurological: Epicritic and protective threshold grossly intact bilaterally.   Musculoskeletal Exam: Initially the pain seemed to be localized around the fourth metatarsal of the right foot.  Today there  continues to be an increased amount of edema with pain to the lateral column of the right foot.  Assessment: 1.  Possible stress reaction fracture fourth metatarsal right foot 2.  Inflammatory capsulitis versus possible Charcot neuroarthropathy right foot   Plan of Care:  1. Patient evaluated.  2.  The patient has not improved with conservative treatment and management.  Today were going to order MRI right foot and ankle.  Conservative treatments have failed so far and the patient continues to have pain with swelling to the right foot. 3.  Continue minimal restricted weightbearing in the cam boot 4.  Refrain from work.  Patient will continue to refrain from work for the moment minimum 3 weeks 5.  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 Felecia Shelling, DPM    [address] [place], [place] [address]                Office [phone]  Fax [phone]

## 2021-07-22 ENCOUNTER — Telehealth: Payer: Self-pay | Admitting: *Deleted

## 2021-07-22 NOTE — Telephone Encounter (Signed)
"  I'm calling about two things.  The first thing is I still haven't heard anything about an appointment for the MRI.  The second thing is [employer] said they have not received an update about my FMLA."  As far as the MRI, he placed the order on 07/15/2021.  I will send his assistant a message and get her to follow-up on that for you.  I have not received any forms regarding the updated FMLA.  "Okay, I'll call them and get them to send that."

## 2021-07-25 ENCOUNTER — Telehealth: Payer: Self-pay | Admitting: Podiatry

## 2021-07-25 NOTE — Telephone Encounter (Signed)
Patient called stating she has not heard from the MRI office to schedule her appt.Patient states she was suppose to hear from them last week.

## 2021-07-25 NOTE — Telephone Encounter (Signed)
Alexis Campos,  I ordered RT foot and ankle MRI on 07/15/2021.  Could you follow-up with this?  Updated the patient please.  Thanks, Dr. Logan Bores

## 2021-07-30 ENCOUNTER — Other Ambulatory Visit: Payer: 59

## 2021-07-31 DIAGNOSIS — M79676 Pain in unspecified toe(s): Secondary | ICD-10-CM

## 2021-08-01 DIAGNOSIS — H04123 Dry eye syndrome of bilateral lacrimal glands: Secondary | ICD-10-CM | POA: Diagnosis not present

## 2021-08-07 ENCOUNTER — Ambulatory Visit
Admission: RE | Admit: 2021-08-07 | Discharge: 2021-08-07 | Disposition: A | Discharge status: Home / Self Care | Payer: 59 | Source: Ambulatory Visit | Attending: Podiatry | Admitting: Podiatry

## 2021-08-07 ENCOUNTER — Other Ambulatory Visit: Payer: Self-pay

## 2021-08-07 DIAGNOSIS — M65871 Other synovitis and tenosynovitis, right ankle and foot: Secondary | ICD-10-CM | POA: Diagnosis not present

## 2021-08-07 DIAGNOSIS — M778 Other enthesopathies, not elsewhere classified: Secondary | ICD-10-CM

## 2021-08-07 DIAGNOSIS — M84374A Stress fracture, right foot, initial encounter for fracture: Secondary | ICD-10-CM

## 2021-08-07 DIAGNOSIS — R531 Weakness: Secondary | ICD-10-CM | POA: Diagnosis not present

## 2021-08-07 DIAGNOSIS — S86311A Strain of muscle(s) and tendon(s) of peroneal muscle group at lower leg level, right leg, initial encounter: Secondary | ICD-10-CM | POA: Diagnosis not present

## 2021-08-15 ENCOUNTER — Encounter: Payer: Self-pay | Admitting: Podiatry

## 2021-08-15 ENCOUNTER — Ambulatory Visit: Payer: 59 | Admitting: Podiatry

## 2021-08-15 ENCOUNTER — Other Ambulatory Visit: Payer: Self-pay

## 2021-08-15 ENCOUNTER — Ambulatory Visit: Payer: 59

## 2021-08-15 DIAGNOSIS — M778 Other enthesopathies, not elsewhere classified: Secondary | ICD-10-CM | POA: Diagnosis not present

## 2021-08-15 DIAGNOSIS — M84374A Stress fracture, right foot, initial encounter for fracture: Secondary | ICD-10-CM

## 2021-08-15 NOTE — Progress Notes (Signed)
SITUATION Reason for Consult: Evaluation for Bilateral Custom Foot Orthoses Patient / Caregiver Report: Patient would like to have something comfortable to stand on for her 12 hr shifts  OBJECTIVE DATA: Patient History / Diagnosis:    ICD-10-CM   1. Capsulitis of right foot  M77.8     2. Fracture, stress, metatarsal, right, initial encounter  M84.374A       Current or Previous Devices: None and no hsitory  Foot Examination: Skin presentation:   Intact Ulcers & Callousing:   None and no history Toe / Foot Deformities:  Pes cavovarus Weight Bearing Presentation:  Cavus Sensation:    Intact  ORTHOTIC RECOMMENDATION Recommended Device: 1x pair of custom functional foot orthotics  GOALS OF ORTHOSES - Reduce Pain - Prevent Foot Deformity - Prevent Progression of Further Foot Deformity - Relieve Pressure - Improve the Overall Biomechanical Function of the Foot and Lower Extremity.  ACTIONS PERFORMED Patient was casted for Foot Orthoses via crush box. Procedure was explained and patient tolerated procedure well. All questions were answered and concerns addressed.  PLAN Potential out of pocket cost was communicated to patient. Casts are to be sent to [facility] for fabrication. Patient is to be called for fitting when devices are ready.

## 2021-08-21 ENCOUNTER — Telehealth: Payer: Self-pay | Admitting: Podiatry

## 2021-08-21 NOTE — Telephone Encounter (Signed)
I am completing paperwork for patient but I dont see notes in for 08/15/2021.

## 2021-08-24 NOTE — Progress Notes (Signed)
HPI: 56 y.o. female PMHx diabetes mellitus presenting today for follow-up evaluation of pain and tenderness to the right foot this been going on for about 3-4 weeks now.  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  She has been experiencing some sharp tightness and pain with walking to her right foot.  Since visit on 06/06/2021 she has not returned to work yet.  Patient continues to have pain and tenderness associated to the right foot and ankle.  She says that the ankle gives out on her.  There is been no improvement despite being in the cam boot.  She is concerned about returning to work.  She is also concerned because of her diabetes mellitus and family history of amputations in the family.  The last few visits we have been concerned for possible acute onset of Charcot neuroarthropathy.  She presents for further treatment and evaluation  Past Medical History:  Diagnosis Date   Anemia    Anemia 05/22/2014   Blood transfusion    Diabetes mellitus    diagnosed 1996-insulin and metformin   Endometriosis    Fibromyalgia    GERD (gastroesophageal reflux disease)    History of blood transfusion 12/09   [facility]   Hyperlipidemia    Hypertension    Neuromuscular disorder ([place])    fibromyalgia   Seasonal allergies    recent bronchitis-chest xray/ct scan-granuloma noted present since 2008   Sinusitis    Splenomegaly 05/22/2014     Physical Exam: General: The patient is alert and oriented x3 in no acute distress.  Dermatology: Skin is warm, dry and supple bilateral lower extremities. Negative for open lesions or macerations.  Vascular: Palpable pedal pulses bilaterally.  Edema noted right midtarsal joint and lateral aspect of the foot.  Capillary refill within normal limits.  Neurological: Epicritic and protective threshold grossly intact bilaterally.   Musculoskeletal Exam: Initially the pain seemed to be localized around the fourth metatarsal of the right foot.  Today there  continues to be an increased amount of edema with pain to the lateral column of the right foot.  MR RT FOOT WO CONTRAST 08/07/2021: IMPRESSION: 1. Flexor and extensor compartment tendons are intact. Severe tendinosis of the distal peroneus brevis tendon just proximal to the fifth metatarsal base with a partial-thickness tear. 2.  No acute osseous injury of the right forefoot.  MR RT ANKLE WO CONTRAST 08/07/2021 IMPRESSION: 1. Tendinosis of the distal peroneus brevis tendon with long segment split tear. Mild tenosynovitis. 2. Otherwise unremarkable MRI of the right ankle.  Assessment: 1.  Inflammatory capsulitis right foot 2.  Tendinosis peroneus brevis tendon right   Plan of Care:  1. Patient evaluated.  2.  Today were going to pursue conservative options for the patient. 3.  Order placed for physical therapy at [facility].  Prescription order provided for the patient 4.  Discontinue the cam boot.  Ankle brace dispensed.  Wear daily 5.  Appointment with Pedorthist for custom molded orthotics 6.  Patient scheduled to return to work 09/13/2021.  Patient may return to work full activity no restrictions  7.  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 Edrick Kins, DPM    [address]  [place], [place] [address]                Office [phone]  Fax [phone]

## 2021-08-25 ENCOUNTER — Other Ambulatory Visit: Payer: 59

## 2021-08-25 DIAGNOSIS — M25571 Pain in right ankle and joints of right foot: Secondary | ICD-10-CM | POA: Diagnosis not present

## 2021-08-28 DIAGNOSIS — M25571 Pain in right ankle and joints of right foot: Secondary | ICD-10-CM | POA: Diagnosis not present

## 2021-09-02 DIAGNOSIS — J0101 Acute recurrent maxillary sinusitis: Secondary | ICD-10-CM | POA: Diagnosis not present

## 2021-09-02 DIAGNOSIS — R110 Nausea: Secondary | ICD-10-CM | POA: Diagnosis not present

## 2021-09-02 DIAGNOSIS — R1031 Right lower quadrant pain: Secondary | ICD-10-CM | POA: Diagnosis not present

## 2021-09-02 DIAGNOSIS — R102 Pelvic and perineal pain: Secondary | ICD-10-CM | POA: Diagnosis not present

## 2021-09-08 DIAGNOSIS — M25571 Pain in right ankle and joints of right foot: Secondary | ICD-10-CM | POA: Diagnosis not present

## 2021-09-10 DIAGNOSIS — M25571 Pain in right ankle and joints of right foot: Secondary | ICD-10-CM | POA: Diagnosis not present

## 2021-09-15 ENCOUNTER — Other Ambulatory Visit (HOSPITAL_COMMUNITY): Payer: Self-pay

## 2021-09-15 ENCOUNTER — Ambulatory Visit: Payer: 59

## 2021-09-15 ENCOUNTER — Other Ambulatory Visit: Payer: Self-pay

## 2021-09-15 DIAGNOSIS — L821 Other seborrheic keratosis: Secondary | ICD-10-CM | POA: Diagnosis not present

## 2021-09-15 DIAGNOSIS — L718 Other rosacea: Secondary | ICD-10-CM | POA: Diagnosis not present

## 2021-09-15 DIAGNOSIS — M778 Other enthesopathies, not elsewhere classified: Secondary | ICD-10-CM

## 2021-09-15 DIAGNOSIS — M25571 Pain in right ankle and joints of right foot: Secondary | ICD-10-CM | POA: Diagnosis not present

## 2021-09-15 MED ORDER — METRONIDAZOLE 0.75 % EX GEL
CUTANEOUS | 3 refills | Status: DC
  Filled 2021-09-15: qty 45, 30d supply, fill #0

## 2021-09-15 NOTE — Progress Notes (Signed)
SITUATION: Reason for Visit: Fitting and Delivery of Custom Fabricated Foot Orthoses Patient Report: Patient reports comfort and is satisfied with device.  OBJECTIVE DATA: Patient History / Diagnosis:     ICD-10-CM   1. Capsulitis of right foot  M77.8       Provided Device:  Custom Functional Foot Orthotics     [facility] [employer]: [id number]  GOAL OF ORTHOSIS - Improve gait - Decrease energy expenditure - Improve Balance - Provide Triplanar stability of foot complex - Facilitate motion  ACTIONS PERFORMED Patient was fit with foot orthotics trimmed to shoe last. Patient tolerated fittign procedure.   Patient was provided with verbal and written instruction and demonstration regarding donning, doffing, wear, care, proper fit, function, purpose, cleaning, and use of the orthosis and in all related precautions and risks and benefits regarding the orthosis.  Patient was also provided with verbal instruction regarding how to report any failures or malfunctions of the orthosis and necessary follow up care. Patient was also instructed to contact our office regarding any change in status that may affect the function of the orthosis.  Patient demonstrated independence with proper donning, doffing, and fit and verbalized understanding of all instructions.  PLAN: Patient is to follow up in one week or as necessary (PRN). All questions were answered and concerns addressed. Plan of care was discussed with and agreed upon by the patient.

## 2021-09-16 ENCOUNTER — Encounter: Payer: Self-pay | Admitting: Podiatry

## 2021-09-16 ENCOUNTER — Ambulatory Visit: Payer: 59 | Admitting: Podiatry

## 2021-09-16 DIAGNOSIS — M778 Other enthesopathies, not elsewhere classified: Secondary | ICD-10-CM | POA: Diagnosis not present

## 2021-09-16 MED ORDER — MELOXICAM 15 MG PO TABS: 15.0000 mg | ORAL_TABLET | Freq: Every day | ORAL | 1 refills | Status: DC

## 2021-09-16 NOTE — Progress Notes (Signed)
° °  HPI: 56 y.o. female PMHx diabetes mellitus presenting today for follow-up evaluation of pain and tenderness to the right foot.  Patient states that since last visit she has gotten significant improvement with the physical therapy.  She is very satisfied.  She does not feel 100% however.  She also got the custom orthotics.  Past Medical History:  Diagnosis Date   Anemia    Anemia 05/22/2014   Blood transfusion    Diabetes mellitus    diagnosed 1996-insulin and metformin   Endometriosis    Fibromyalgia    GERD (gastroesophageal reflux disease)    History of blood transfusion 12/09   [facility]   Hyperlipidemia    Hypertension    Neuromuscular disorder ([facility])    fibromyalgia   Seasonal allergies    recent bronchitis-chest xray/ct scan-granuloma noted present since 2008   Sinusitis    Splenomegaly 05/22/2014     Physical Exam: General: The patient is alert and oriented x3 in no acute distress.  Dermatology: Skin is warm, dry and supple bilateral lower extremities. Negative for open lesions or macerations.  Vascular: Palpable pedal pulses bilaterally.  Overall improvement but there continues to be some minimal edema noted right midtarsal joint and lateral aspect of the foot.  Capillary refill within normal limits.  Neurological: Epicritic and protective threshold grossly intact bilaterally.   Musculoskeletal Exam: There continues to be some tenderness around the lateral column of the right foot as well as midfoot.  Overall there is some improvement of the  MR RT FOOT WO CONTRAST 08/07/2021: IMPRESSION: 1. Flexor and extensor compartment tendons are intact. Severe tendinosis of the distal peroneus brevis tendon just proximal to the fifth metatarsal base with a partial-thickness tear. 2.  No acute osseous injury of the right forefoot.  MR RT ANKLE WO CONTRAST 08/07/2021 IMPRESSION: 1. Tendinosis of the distal peroneus brevis tendon with long segment split tear. Mild  tenosynovitis. 2. Otherwise unremarkable MRI of the right ankle.  Assessment: 1.  Inflammatory capsulitis right foot 2.  Tendinosis peroneus brevis tendon right   Plan of Care:  1. Patient evaluated.  Today were going to extend the patient's return to work date.  She continues to have some pain and tenderness but she is getting some significant improvement and feels that she may be closer to 100% in the next 2-3 weeks 2.  Continue wearing custom molded orthotics that were dispensed here in the office. 3.  Continue physical therapy at [facility].  Patient notices significant improvement with the physical therapy 4.  We will plan for the patient to return to work 10/04/2021 at that time she should be full activity no restrictions 5.  FMLA paperwork was completed today to limit the patient's restrictions up until then 6.  Prescription for meloxicam 15 mg daily as needed  7.  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 Felecia Shelling, DPM    [address] [place], [place] [address]                Office [phone]  Fax [phone]

## 2021-09-18 ENCOUNTER — Other Ambulatory Visit (HOSPITAL_COMMUNITY): Payer: Self-pay

## 2021-09-18 DIAGNOSIS — M25571 Pain in right ankle and joints of right foot: Secondary | ICD-10-CM | POA: Diagnosis not present

## 2021-09-18 MED ORDER — INSULIN LISPRO 100 UNIT/ML IJ SOLN
100.0000 [IU] | Freq: Every day | INTRAMUSCULAR | 5 refills | Status: DC
  Filled 2021-09-18: qty 90, 90d supply, fill #0
  Filled 2022-03-16: qty 90, 90d supply, fill #1
  Filled 2022-07-07: qty 90, 90d supply, fill #2

## 2021-09-18 MED FILL — Dulaglutide Soln Auto-injector 4.5 MG/0.5ML: SUBCUTANEOUS | 28 days supply | Qty: 2 | Fill #1 | Status: AC

## 2021-09-18 MED FILL — Fenofibrate Tab 145 MG: ORAL | 90 days supply | Qty: 90 | Fill #2 | Status: AC

## 2021-09-22 ENCOUNTER — Other Ambulatory Visit: Payer: 59

## 2021-09-22 ENCOUNTER — Other Ambulatory Visit (HOSPITAL_COMMUNITY): Payer: Self-pay

## 2021-09-22 DIAGNOSIS — M25571 Pain in right ankle and joints of right foot: Secondary | ICD-10-CM | POA: Diagnosis not present

## 2021-09-24 ENCOUNTER — Other Ambulatory Visit (HOSPITAL_COMMUNITY): Payer: Self-pay

## 2021-09-24 MED ORDER — DEXCOM G6 SENSOR MISC
4 refills | Status: DC
  Filled 2021-09-24: qty 3, 30d supply, fill #0
  Filled 2021-10-15 – 2022-03-16 (×3): qty 9, 90d supply, fill #0
  Filled 2022-07-29: qty 9, 90d supply, fill #1

## 2021-09-26 DIAGNOSIS — M25571 Pain in right ankle and joints of right foot: Secondary | ICD-10-CM | POA: Diagnosis not present

## 2021-09-30 DIAGNOSIS — M25571 Pain in right ankle and joints of right foot: Secondary | ICD-10-CM | POA: Diagnosis not present

## 2021-10-02 ENCOUNTER — Other Ambulatory Visit (HOSPITAL_COMMUNITY): Payer: Self-pay

## 2021-10-02 DIAGNOSIS — M25571 Pain in right ankle and joints of right foot: Secondary | ICD-10-CM | POA: Diagnosis not present

## 2021-10-06 DIAGNOSIS — M25571 Pain in right ankle and joints of right foot: Secondary | ICD-10-CM | POA: Diagnosis not present

## 2021-10-13 DIAGNOSIS — M25571 Pain in right ankle and joints of right foot: Secondary | ICD-10-CM | POA: Diagnosis not present

## 2021-10-15 ENCOUNTER — Other Ambulatory Visit (HOSPITAL_COMMUNITY): Payer: Self-pay

## 2021-10-23 ENCOUNTER — Other Ambulatory Visit (HOSPITAL_COMMUNITY): Payer: Self-pay

## 2021-10-28 ENCOUNTER — Ambulatory Visit: Payer: 59 | Admitting: Podiatry

## 2021-10-31 ENCOUNTER — Other Ambulatory Visit (HOSPITAL_COMMUNITY): Payer: Self-pay

## 2021-11-03 ENCOUNTER — Other Ambulatory Visit (HOSPITAL_COMMUNITY): Payer: Self-pay

## 2021-11-03 ENCOUNTER — Other Ambulatory Visit: Payer: 59

## 2021-11-18 ENCOUNTER — Ambulatory Visit: Payer: 59 | Admitting: Cardiovascular Disease

## 2021-12-25 ENCOUNTER — Other Ambulatory Visit (HOSPITAL_COMMUNITY): Payer: Self-pay

## 2021-12-25 DIAGNOSIS — R109 Unspecified abdominal pain: Secondary | ICD-10-CM | POA: Diagnosis not present

## 2021-12-25 DIAGNOSIS — R110 Nausea: Secondary | ICD-10-CM | POA: Diagnosis not present

## 2021-12-25 DIAGNOSIS — R300 Dysuria: Secondary | ICD-10-CM | POA: Diagnosis not present

## 2021-12-25 MED ORDER — CIPROFLOXACIN HCL 500 MG PO TABS
500.0000 mg | ORAL_TABLET | Freq: Two times a day (BID) | ORAL | 0 refills | Status: DC
  Filled 2021-12-25: qty 14, 7d supply, fill #0

## 2021-12-25 MED ORDER — ONDANSETRON HCL 8 MG PO TABS
8.0000 mg | ORAL_TABLET | Freq: Three times a day (TID) | ORAL | 1 refills | Status: DC | PRN
  Filled 2021-12-25: qty 90, 30d supply, fill #0

## 2021-12-26 ENCOUNTER — Emergency Department (HOSPITAL_BASED_OUTPATIENT_CLINIC_OR_DEPARTMENT_OTHER): Payer: 59

## 2021-12-26 ENCOUNTER — Encounter (HOSPITAL_BASED_OUTPATIENT_CLINIC_OR_DEPARTMENT_OTHER): Payer: Self-pay | Admitting: Emergency Medicine

## 2021-12-26 ENCOUNTER — Inpatient Hospital Stay (HOSPITAL_BASED_OUTPATIENT_CLINIC_OR_DEPARTMENT_OTHER)
Admission: EM | Admit: 2021-12-26 | Discharge: 2021-12-29 | DRG: 871 | Disposition: A | Discharge status: Home / Self Care | Payer: 59 | Attending: Family Medicine | Admitting: Family Medicine

## 2021-12-26 ENCOUNTER — Other Ambulatory Visit: Payer: Self-pay

## 2021-12-26 DIAGNOSIS — Z885 Allergy status to narcotic agent status: Secondary | ICD-10-CM | POA: Diagnosis not present

## 2021-12-26 DIAGNOSIS — E785 Hyperlipidemia, unspecified: Secondary | ICD-10-CM | POA: Diagnosis present

## 2021-12-26 DIAGNOSIS — E876 Hypokalemia: Secondary | ICD-10-CM | POA: Diagnosis not present

## 2021-12-26 DIAGNOSIS — M797 Fibromyalgia: Secondary | ICD-10-CM | POA: Diagnosis present

## 2021-12-26 DIAGNOSIS — Z79899 Other long term (current) drug therapy: Secondary | ICD-10-CM

## 2021-12-26 DIAGNOSIS — E1310 Other specified diabetes mellitus with ketoacidosis without coma: Secondary | ICD-10-CM | POA: Diagnosis not present

## 2021-12-26 DIAGNOSIS — Z9641 Presence of insulin pump (external) (internal): Secondary | ICD-10-CM | POA: Diagnosis present

## 2021-12-26 DIAGNOSIS — K219 Gastro-esophageal reflux disease without esophagitis: Secondary | ICD-10-CM | POA: Diagnosis present

## 2021-12-26 DIAGNOSIS — E1110 Type 2 diabetes mellitus with ketoacidosis without coma: Secondary | ICD-10-CM | POA: Diagnosis not present

## 2021-12-26 DIAGNOSIS — D6959 Other secondary thrombocytopenia: Secondary | ICD-10-CM | POA: Diagnosis present

## 2021-12-26 DIAGNOSIS — R6520 Severe sepsis without septic shock: Secondary | ICD-10-CM | POA: Diagnosis not present

## 2021-12-26 DIAGNOSIS — N179 Acute kidney failure, unspecified: Secondary | ICD-10-CM | POA: Diagnosis present

## 2021-12-26 DIAGNOSIS — I129 Hypertensive chronic kidney disease with stage 1 through stage 4 chronic kidney disease, or unspecified chronic kidney disease: Secondary | ICD-10-CM | POA: Diagnosis present

## 2021-12-26 DIAGNOSIS — I471 Supraventricular tachycardia: Secondary | ICD-10-CM | POA: Diagnosis present

## 2021-12-26 DIAGNOSIS — N1832 Chronic kidney disease, stage 3b: Secondary | ICD-10-CM | POA: Diagnosis present

## 2021-12-26 DIAGNOSIS — K760 Fatty (change of) liver, not elsewhere classified: Secondary | ICD-10-CM | POA: Diagnosis present

## 2021-12-26 DIAGNOSIS — Z8249 Family history of ischemic heart disease and other diseases of the circulatory system: Secondary | ICD-10-CM

## 2021-12-26 DIAGNOSIS — R6883 Chills (without fever): Secondary | ICD-10-CM | POA: Diagnosis not present

## 2021-12-26 DIAGNOSIS — R000 Tachycardia, unspecified: Secondary | ICD-10-CM

## 2021-12-26 DIAGNOSIS — N111 Chronic obstructive pyelonephritis: Secondary | ICD-10-CM | POA: Diagnosis not present

## 2021-12-26 DIAGNOSIS — A419 Sepsis, unspecified organism: Principal | ICD-10-CM | POA: Diagnosis present

## 2021-12-26 DIAGNOSIS — N12 Tubulo-interstitial nephritis, not specified as acute or chronic: Secondary | ICD-10-CM | POA: Diagnosis present

## 2021-12-26 DIAGNOSIS — R059 Cough, unspecified: Secondary | ICD-10-CM | POA: Diagnosis not present

## 2021-12-26 DIAGNOSIS — N2889 Other specified disorders of kidney and ureter: Secondary | ICD-10-CM | POA: Diagnosis not present

## 2021-12-26 DIAGNOSIS — I4581 Long QT syndrome: Secondary | ICD-10-CM | POA: Diagnosis not present

## 2021-12-26 DIAGNOSIS — Z20822 Contact with and (suspected) exposure to covid-19: Secondary | ICD-10-CM | POA: Diagnosis present

## 2021-12-26 DIAGNOSIS — Z888 Allergy status to other drugs, medicaments and biological substances status: Secondary | ICD-10-CM | POA: Diagnosis not present

## 2021-12-26 DIAGNOSIS — Z794 Long term (current) use of insulin: Secondary | ICD-10-CM

## 2021-12-26 DIAGNOSIS — E1122 Type 2 diabetes mellitus with diabetic chronic kidney disease: Secondary | ICD-10-CM | POA: Diagnosis present

## 2021-12-26 DIAGNOSIS — R9431 Abnormal electrocardiogram [ECG] [EKG]: Secondary | ICD-10-CM

## 2021-12-26 LAB — CBC WITH DIFFERENTIAL/PLATELET
Abs Immature Granulocytes: 0.32 10*3/uL — ABNORMAL HIGH (ref 0.00–0.07)
Basophils Absolute: 0 10*3/uL (ref 0.0–0.1)
Basophils Relative: 0 %
Eosinophils Absolute: 0 10*3/uL (ref 0.0–0.5)
Eosinophils Relative: 0 %
HCT: 37.4 % (ref 36.0–46.0)
Hemoglobin: 12.2 g/dL (ref 12.0–15.0)
Immature Granulocytes: 5 %
Lymphocytes Relative: 4 %
Lymphs Abs: 0.3 10*3/uL — ABNORMAL LOW (ref 0.7–4.0)
MCH: 31 pg (ref 26.0–34.0)
MCHC: 32.6 g/dL (ref 30.0–36.0)
MCV: 94.9 fL (ref 80.0–100.0)
Monocytes Absolute: 0.2 10*3/uL (ref 0.1–1.0)
Monocytes Relative: 3 %
Neutro Abs: 6 10*3/uL (ref 1.7–7.7)
Neutrophils Relative %: 88 %
Platelets: 132 10*3/uL — ABNORMAL LOW (ref 150–400)
RBC: 3.94 MIL/uL (ref 3.87–5.11)
RDW: 12.7 % (ref 11.5–15.5)
WBC: 6.9 10*3/uL (ref 4.0–10.5)
nRBC: 0 % (ref 0.0–0.2)

## 2021-12-26 LAB — BETA-HYDROXYBUTYRIC ACID: Beta-Hydroxybutyric Acid: 4.01 mmol/L — ABNORMAL HIGH (ref 0.05–0.27)

## 2021-12-26 LAB — COMPREHENSIVE METABOLIC PANEL
ALT: 27 U/L (ref 0–44)
AST: 18 U/L (ref 15–41)
Albumin: 3.7 g/dL (ref 3.5–5.0)
Alkaline Phosphatase: 74 U/L (ref 38–126)
Anion gap: 18 — ABNORMAL HIGH (ref 5–15)
BUN: 19 mg/dL (ref 6–20)
CO2: 20 mmol/L — ABNORMAL LOW (ref 22–32)
Calcium: 9.7 mg/dL (ref 8.9–10.3)
Chloride: 92 mmol/L — ABNORMAL LOW (ref 98–111)
Creatinine, Ser: 1.24 mg/dL — ABNORMAL HIGH (ref 0.44–1.00)
GFR, Estimated: 51 mL/min — ABNORMAL LOW (ref >60)
Glucose, Bld: 387 mg/dL — ABNORMAL HIGH (ref 70–99)
Potassium: 4 mmol/L (ref 3.5–5.1)
Sodium: 130 mmol/L — ABNORMAL LOW (ref 135–145)
Total Bilirubin: 0.7 mg/dL (ref 0.3–1.2)
Total Protein: 7.5 g/dL (ref 6.5–8.1)

## 2021-12-26 LAB — URINALYSIS, ROUTINE W REFLEX MICROSCOPIC
Bilirubin Urine: NEGATIVE
Glucose, UA: >1000 mg/dL — AB
Ketones, ur: >80 mg/dL — AB
Leukocytes,Ua: NEGATIVE
Nitrite: NEGATIVE
Protein, ur: 100 mg/dL — AB
Specific Gravity, Urine: 1.025 (ref 1.005–1.030)
pH: 5.5 (ref 5.0–8.0)

## 2021-12-26 LAB — APTT: aPTT: 33 seconds (ref 24–36)

## 2021-12-26 LAB — BASIC METABOLIC PANEL
Anion gap: 8 (ref 5–15)
BUN: 21 mg/dL — ABNORMAL HIGH (ref 6–20)
CO2: 25 mmol/L (ref 22–32)
Calcium: 8.7 mg/dL — ABNORMAL LOW (ref 8.9–10.3)
Chloride: 102 mmol/L (ref 98–111)
Creatinine, Ser: 1.26 mg/dL — ABNORMAL HIGH (ref 0.44–1.00)
GFR, Estimated: 50 mL/min — ABNORMAL LOW (ref >60)
Glucose, Bld: 181 mg/dL — ABNORMAL HIGH (ref 70–99)
Potassium: 4.2 mmol/L (ref 3.5–5.1)
Sodium: 135 mmol/L (ref 135–145)

## 2021-12-26 LAB — I-STAT VENOUS BLOOD GAS, ED
Acid-base deficit: 2 mmol/L (ref 0.0–2.0)
Bicarbonate: 21.3 mmol/L (ref 20.0–28.0)
Calcium, Ion: 1.17 mmol/L (ref 1.15–1.40)
HCT: 31 % — ABNORMAL LOW (ref 36.0–46.0)
Hemoglobin: 10.5 g/dL — ABNORMAL LOW (ref 12.0–15.0)
O2 Saturation: 89 %
Patient temperature: 106.1
Potassium: 4.7 mmol/L (ref 3.5–5.1)
Sodium: 131 mmol/L — ABNORMAL LOW (ref 135–145)
TCO2: 22 mmol/L (ref 22–32)
pCO2, Ven: 36.9 mmHg — ABNORMAL LOW (ref 44–60)
pH, Ven: 7.385 (ref 7.25–7.43)
pO2, Ven: 70 mmHg — ABNORMAL HIGH (ref 32–45)

## 2021-12-26 LAB — PROTIME-INR
INR: 1 (ref 0.8–1.2)
Prothrombin Time: 13.2 seconds (ref 11.4–15.2)

## 2021-12-26 LAB — CBG MONITORING, ED
Glucose-Capillary: 385 mg/dL — ABNORMAL HIGH (ref 70–99)
Glucose-Capillary: 333 mg/dL — ABNORMAL HIGH (ref 70–99)
Glucose-Capillary: 378 mg/dL — ABNORMAL HIGH (ref 70–99)
Glucose-Capillary: 315 mg/dL — ABNORMAL HIGH (ref 70–99)
Glucose-Capillary: 231 mg/dL — ABNORMAL HIGH (ref 70–99)
Glucose-Capillary: 181 mg/dL — ABNORMAL HIGH (ref 70–99)
Glucose-Capillary: 158 mg/dL — ABNORMAL HIGH (ref 70–99)
Glucose-Capillary: 176 mg/dL — ABNORMAL HIGH (ref 70–99)
Glucose-Capillary: 189 mg/dL — ABNORMAL HIGH (ref 70–99)

## 2021-12-26 LAB — LACTIC ACID, PLASMA
Lactic Acid, Venous: 2 mmol/L (ref 0.5–1.9)
Lactic Acid, Venous: 1.2 mmol/L (ref 0.5–1.9)
Lactic Acid, Venous: 0.9 mmol/L (ref 0.5–1.9)

## 2021-12-26 LAB — MAGNESIUM: Magnesium: 1.5 mg/dL — ABNORMAL LOW (ref 1.7–2.4)

## 2021-12-26 LAB — RESP PANEL BY RT-PCR (FLU A&B, COVID) ARPGX2
Influenza A by PCR: NEGATIVE
Influenza B by PCR: NEGATIVE
SARS Coronavirus 2 by RT PCR: NEGATIVE

## 2021-12-26 LAB — PREGNANCY, URINE: Preg Test, Ur: NEGATIVE

## 2021-12-26 MED ORDER — LACTATED RINGERS IV BOLUS (SEPSIS)
500.0000 mL | Freq: Once | INTRAVENOUS | Status: AC
  Administered 2021-12-26: 500 mL via INTRAVENOUS

## 2021-12-26 MED ORDER — SODIUM CHLORIDE 0.9 % IV SOLN: INTRAVENOUS | Status: DC | PRN

## 2021-12-26 MED ORDER — ACETAMINOPHEN 325 MG PO TABS
650.0000 mg | ORAL_TABLET | Freq: Once | ORAL | Status: AC
  Administered 2021-12-26: 650 mg via ORAL
  Filled 2021-12-26: qty 2

## 2021-12-26 MED ORDER — SODIUM CHLORIDE 0.9 % IV SOLN
2.0000 g | INTRAVENOUS | Status: DC
  Administered 2021-12-26 – 2021-12-27 (×2): 2 g via INTRAVENOUS
  Filled 2021-12-26 (×2): qty 20

## 2021-12-26 MED ORDER — PROCHLORPERAZINE EDISYLATE 10 MG/2ML IJ SOLN
5.0000 mg | Freq: Once | INTRAMUSCULAR | Status: AC
Start: 2021-12-26 — End: 2021-12-26
  Administered 2021-12-26: 5 mg via INTRAVENOUS
  Filled 2021-12-26: qty 2

## 2021-12-26 MED ORDER — LACTATED RINGERS IV BOLUS
1000.0000 mL | Freq: Once | INTRAVENOUS | Status: DC
Start: 2021-12-26 — End: 2021-12-27

## 2021-12-26 MED ORDER — MAGNESIUM SULFATE 2 GM/50ML IV SOLN
2.0000 g | Freq: Once | INTRAVENOUS | Status: AC
  Administered 2021-12-26: 2 g via INTRAVENOUS
  Filled 2021-12-26: qty 50

## 2021-12-26 MED ORDER — LACTATED RINGERS IV BOLUS (SEPSIS)
1000.0000 mL | Freq: Once | INTRAVENOUS | Status: AC
  Administered 2021-12-26: 1000 mL via INTRAVENOUS

## 2021-12-26 MED ORDER — POTASSIUM CHLORIDE 10 MEQ/100ML IV SOLN
10.0000 meq | INTRAVENOUS | Status: AC
  Administered 2021-12-26 (×2): 10 meq via INTRAVENOUS
  Filled 2021-12-26 (×2): qty 100

## 2021-12-26 MED ORDER — ONDANSETRON HCL 4 MG/2ML IJ SOLN
4.0000 mg | Freq: Once | INTRAMUSCULAR | Status: AC
  Administered 2021-12-26: 4 mg via INTRAVENOUS
  Filled 2021-12-26: qty 2

## 2021-12-26 MED ORDER — LACTATED RINGERS IV SOLN: INTRAVENOUS | Status: DC

## 2021-12-26 MED ORDER — MORPHINE SULFATE (PF) 4 MG/ML IV SOLN
4.0000 mg | Freq: Once | INTRAVENOUS | Status: AC
  Administered 2021-12-26: 4 mg via INTRAVENOUS
  Filled 2021-12-26: qty 1

## 2021-12-26 MED ORDER — INSULIN REGULAR(HUMAN) IN NACL 100-0.9 UT/100ML-% IV SOLN
INTRAVENOUS | Status: DC
  Administered 2021-12-26: 15 [IU]/h via INTRAVENOUS
  Filled 2021-12-26: qty 100

## 2021-12-26 MED ORDER — DEXTROSE 50 % IV SOLN: 0.0000 mL | INTRAVENOUS | Status: DC | PRN

## 2021-12-26 MED ORDER — DEXTROSE IN LACTATED RINGERS 5 % IV SOLN: INTRAVENOUS | Status: DC

## 2021-12-26 NOTE — ED Triage Notes (Signed)
Pt. States she has history of kidney stones, burning when urinating, and chills. Pt. States she had a Rocephin yesterday IM at [facility]. States she is not feeling well today. States that they advised her to go to the ER yesterday.  ?

## 2021-12-26 NOTE — ED Provider Notes (Signed)
?[facility] EMERGENCY DEPT ?Provider Note ? ? ?CSN: [id number] ?Arrival date & time: 12/26/21  9407 ? ?  ? ?History ? ?Chief Complaint  ?Patient presents with  ? Urinary Frequency  ? Flank Pain  ? ? ?Alexis Campos is a 56 y.o. female.  She has a history of diabetes.  She started with some dysuria a few days ago.  Had some shaking chills and a fever, right flank pain. Hx of kidney stones.  Nausea and vomiting.  Saw PCP yesterday who recommended she come to the emergency department.  Given an IM dose of Rocephin and started on Cipro.  Continues to be symptomatic today.  Feeling her heart racing, has a headache.  Last took fever medication last evening. ? ?The history is provided by the patient.  ?Urinary Frequency ?This is a recurrent problem. The current episode started more than 2 days ago. The problem occurs hourly. The problem has been gradually worsening. Associated symptoms include headaches. Pertinent negatives include no chest pain, no abdominal pain and no shortness of breath. Nothing aggravates the symptoms. Nothing relieves the symptoms. She has tried nothing for the symptoms. The treatment provided no relief.  ?Flank Pain ?This is a recurrent problem. The current episode started more than 2 days ago. The problem occurs constantly. The problem has not changed since onset.Associated symptoms include headaches. Pertinent negatives include no chest pain, no abdominal pain and no shortness of breath. Nothing aggravates the symptoms. Nothing relieves the symptoms. She has tried rest for the symptoms. The treatment provided no relief.  ? ?  ? ?Home Medications ?Prior to Admission medications   ?Medication Sig Start Date End Date Taking? Authorizing Provider  ?albuterol (VENTOLIN HFA) 108 (90 Base) MCG/ACT inhaler Inhale 1-2 puffs into the lungs every 6 (six) hours as needed (cough). ?Patient not taking: Reported on 05/19/2021 03/13/21   Serafina Royals, FNP  ?benzonatate (TESSALON) 100 MG capsule  Take 1 capsule (100 mg total) by mouth every 8 (eight) hours. ?Patient not taking: Reported on 05/19/2021 03/13/21   Serafina Royals, [place]  ?ciprofloxacin (CIPRO) 500 MG tablet Take 1 tablet (500 mg total) by mouth every 12 (twelve) hours for 7 days. 12/25/21     ?Continuous Blood Gluc Receiver (DEXCOM G6 RECEIVER) DEVI Use as directed. 01/21/21     ?Continuous Blood Gluc Sensor (DEXCOM G6 SENSOR) MISC Use as directed every 10 days 09/24/21     ?Continuous Blood Gluc Transmit (DEXCOM G6 TRANSMITTER) MISC Use as directed 12/29/20     ?ergocalciferol (VITAMIN D2) 1.25 MG (50000 UT) capsule Take 1 capsule (50,000 Units total) by mouth once a week. ?Patient not taking: Reported on 05/19/2021 02/06/21     ?ergocalciferol (VITAMIN D2) 50000 UNITS capsule Take 50,000 Units by mouth once a week. On Wednesdays.    [provider]  ?escitalopram (LEXAPRO) 20 MG tablet TAKE 1 TABLET BY MOUTH ONCE DAILY 11/12/20 11/12/21  Jacelyn Pi, MD  ?fenofibrate (TRICOR) 145 MG tablet TAKE 1 TABLET BY MOUTH ONCE DAILY 11/12/20 12/21/21  Jacelyn Pi, MD  ?icosapent Ethyl (VASCEPA) 1 g capsule Take 2 capsules (2 g total) by mouth 2 (two) times daily. 02/05/21   Nahser, Wonda Cheng, MD  ?Insulin Aspart (NOVOLOG [place]) Inject 2-25 Units into the skin 3 (three) times daily. Sliding scale insulin, depending on blood sugar reading & carbs eaten, and with snacks ?Patient not taking: Reported on 05/19/2021    [provider]  ?insulin degludec (TRESIBA FLEXTOUCH) 100 UNIT/ML FlexTouch Pen Inject 15 Units into  the skin daily. 03/31/21     ?Insulin Disposable Pump (OMNIPOD 5 G6 INTRO, GEN 5,) KIT Use as directed 02/27/21     ?Insulin Disposable Pump (OMNIPOD 5 G6 POD, GEN 5,) MISC Use as directed every 2 days 02/27/21     ?insulin lispro (HUMALOG) 100 UNIT/ML injection Inject up to 100 units/day under the skin via pump. 05/01/20   Jacelyn Pi, MD  ?insulin lispro (HUMALOG) 100 UNIT/ML injection Inject 1 mL (100 Units total) into the skin daily via  insulin pump 09/18/21     ?ivabradine (CORLANOR) 5 MG TABS tablet Take 2m (3 tablets) two hours prior before cardiac CT for a target heart rate less than 60bpm. ?Patient not taking: Reported on 05/19/2021 02/19/21   AKate Sable MD  ?meloxicam (MOBIC) 15 MG tablet Take 1 tablet (15 mg total) by mouth daily. 09/16/21   EEdrick Kins DPM  ?metoprolol tartrate (LOPRESSOR) 50 MG tablet TAKE 3 TABLETS BY MOUTH TWICE DAILY. 02/22/20 05/22/21  KJani Gravel MD  ?metoprolol tartrate (LOPRESSOR) 50 MG tablet Take 3 tablets (150 mg total) by mouth 2 (two) times daily. 05/27/21     ?metroNIDAZOLE (METROGEL) 0.75 % gel Apply to face twice daily as maintenance 09/15/21     ?ondansetron (ZOFRAN) 8 MG tablet Take 1 tablet (8 mg total) by mouth every 8 (eight) hours as needed. 12/25/21     ?ondansetron (ZOFRAN-ODT) 4 MG disintegrating tablet Take 1 tablet (4 mg total) by mouth every 8 (eight) hours as needed for nausea or vomiting. 07/15/18   CCoral Spikes DO  ?ondansetron (ZOFRAN-ODT) 8 MG disintegrating tablet Take 1 tablet (8 mg total) on the tounge and allow to dissolve as needed by mouth 3 (three) times daily. ?Patient not taking: Reported on 05/19/2021 01/21/21     ?rosuvastatin (CRESTOR) 10 MG tablet Take 1 tablet (10 mg total) by mouth daily. 02/20/21   Nahser, PWonda Cheng MD  ?Vitamin D, Ergocalciferol, (DRISDOL) 1.25 MG (50000 UNIT) CAPS capsule Take 1 capsule (50,000 Units total) by mouth once a week. 05/30/21     ?   ? ?Allergies    ?Codeine, Duloxetine hcl, Lisinopril, and Metoclopramide hcl   ? ?Review of Systems   ?Review of Systems  ?Constitutional:  Positive for chills and fever.  ?HENT:  Negative for sore throat.   ?Respiratory:  Negative for shortness of breath.   ?Cardiovascular:  Negative for chest pain.  ?Gastrointestinal:  Positive for nausea and vomiting. Negative for abdominal pain.  ?Genitourinary:  Positive for flank pain and frequency. Negative for dysuria.  ?Skin:  Negative for rash.  ?Neurological:  Positive  for headaches.  ? ?Physical Exam ?Updated Vital Signs ?BP (!) 142/82 (BP Location: Right Arm)   Pulse (!) 135   Temp (!) 100.9 ?F (38.3 ?C)   Resp (!) 28   Ht _0  (1.778 m)   Wt 103.1 kg   LMP 06/14/2018 (Approximate)   SpO2 97%   BMI 32.61 kg/m?  ?Physical Exam ?Vitals and nursing note reviewed.  ?Constitutional:   ?   General: She is not in acute distress. ?   Appearance: Normal appearance. She is well-developed.  ?HENT:  ?   Head: Normocephalic and atraumatic.  ?Eyes:  ?   Conjunctiva/sclera: Conjunctivae normal.  ?Cardiovascular:  ?   Rate and Rhythm: Regular rhythm. Tachycardia present.  ?   Heart sounds: No murmur heard. ?Pulmonary:  ?   Effort: Pulmonary effort is normal. No respiratory distress.  ?   Breath sounds:  Normal breath sounds.  ?Abdominal:  ?   Palpations: Abdomen is soft.  ?   Tenderness: There is no abdominal tenderness. There is no guarding or rebound.  ?Musculoskeletal:     ?   General: No swelling.  ?   Cervical back: Neck supple.  ?Skin: ?   General: Skin is warm and dry.  ?   Capillary Refill: Capillary refill takes less than 2 seconds.  ?Neurological:  ?   General: No focal deficit present.  ?   Mental Status: She is alert.  ?   Sensory: No sensory deficit.  ?   Motor: No weakness.  ? ? ?ED Results / Procedures / Treatments   ?Labs ?(all labs ordered are listed, but only abnormal results are displayed) ?Labs Reviewed  ?URINALYSIS, ROUTINE W REFLEX MICROSCOPIC - Abnormal; Notable for the following components:  ?    Result Value  ? Glucose, UA >1,000 (*)   ? Hgb urine dipstick MODERATE (*)   ? Ketones, ur >80 (*)   ? Protein, ur 100 (*)   ? Bacteria, UA RARE (*)   ? All other components within normal limits  ?LACTIC ACID, PLASMA - Abnormal; Notable for the following components:  ? Lactic Acid, Venous 2.0 (*)   ? All other components within normal limits  ?COMPREHENSIVE METABOLIC PANEL - Abnormal; Notable for the following components:  ? Sodium 130 (*)   ? Chloride 92 (*)   ? CO2 20  (*)   ? Glucose, Bld 387 (*)   ? Creatinine, Ser 1.24 (*)   ? GFR, Estimated 51 (*)   ? Anion gap 18 (*)   ? All other components within normal limits  ?CBC WITH DIFFERENTIAL/PLATELET - Abnormal; Notable for t

## 2021-12-26 NOTE — ED Notes (Addendum)
Placed on 2L 89 at good pleth Md notifed of HR and bladder scan amount. RT notifed ?

## 2021-12-26 NOTE — ED Notes (Signed)
MD notified about rectal temp. ?

## 2021-12-26 NOTE — ED Notes (Signed)
LR going right AC and Rocephin going IV in Left AC. Had trouble in Epic with carrier fluids and setting up secondary IV in computer ?

## 2021-12-26 NOTE — ED Provider Notes (Signed)
?  Physical Exam  ?BP 101/65   Pulse 98   Temp 98.9 ?F (37.2 ?C) (Oral)   Resp (!) 21   Ht 5\' 10"  (1.778 m)   Wt 103.1 kg   LMP 06/14/2018 (Approximate)   SpO2 98%   BMI 32.61 kg/m?  ? ?Physical Exam ? ?Procedures  ?Procedures ? ?ED Course / MDM  ? ?Clinical Course as of 12/26/21 2200  ?Fri Dec 26, 2021  ?1055 Chest x-ray interpreted by me as no acute infiltrates.  Does have pulmonary nodule.  Awaiting radiology reading. [MB]  ?1403 Discussed with Dr. 1404 [facility] hospitalist who will put the patient in for a bed. [MB]  ?  ?Clinical Course User Index ?[MB] Sharl Ma, MD  ? ?Medical Decision Making ?Amount and/or Complexity of Data Reviewed ?Labs: ordered. ?Radiology: ordered. ?ECG/medicine tests: ordered. ? ?Risk ?OTC drugs. ?Prescription drug management. ?Decision regarding hospitalization. ? ? ?Pending admission.  Fever up.  Potentially pyelonephritis.  Heart rate is increased.  More nauseousness.  Temperature is gone up to 106 on rectal.  Will be given more Tylenol.  Another fluid boluses been given. ?Patient's heart rate has come down.  Feeling much better after defervesced since.  More fluid had been given.  Reviewing vital signs there are pressures that dropped in the 80s, however discussed with nurse and she states that she thinks these are erroneous.  Patient states she still feels good.  With checking with better positioning and better cough pressures around 100.  Repeat lactate had been ordered at 7:00 to be done but has not been done yet.  Now reportedly has been drawn.  The hypotension was while the patient was laying on her side with a wrist cuff.  Do not think it was accurate. ? ?CRITICAL CARE ?Performed by: Terrilee Files ?Total critical care time: 30 minutes ?Critical care time was exclusive of separately billable procedures and treating other patients. ?Critical care was necessary to treat or prevent imminent or life-threatening deterioration. ?Critical care was time spent personally  by me on the following activities: development of treatment plan with patient and/or surrogate as well as nursing, discussions with consultants, evaluation of patient's response to treatment, examination of patient, obtaining history from patient or surrogate, ordering and performing treatments and interventions, ordering and review of laboratory studies, ordering and review of radiographic studies, pulse oximetry and re-evaluation of patient's condition. ? ? ? ? ? ?  ?Benjiman Core, MD ?12/26/21 2200 ? ?

## 2021-12-26 NOTE — ED Notes (Signed)
Consulter with MD about Pt coming up on receiving 4 L of fluids once current continuous fluids complete. Pt has only urinated once which was at beginning of treatment. Also spoke to Diabetes coordinator Smith Mince. Per Md endo tool to start after obtaining blood work and bladder scan. Pt alert and oriented ?

## 2021-12-26 NOTE — Sepsis Progress Note (Signed)
eLink is following this Code Sepsis. °

## 2021-12-26 NOTE — ED Notes (Signed)
Bladder scan amount: 463 mL ?

## 2021-12-26 NOTE — ED Notes (Addendum)
Called [facility] to transport patient to [facility] Stepdown room# [id number] ?

## 2021-12-26 NOTE — ED Notes (Signed)
Patient transported to CT 

## 2021-12-26 NOTE — ED Notes (Signed)
Pt stated that pain level has dropped and nausea has subsided ?

## 2021-12-26 NOTE — ED Notes (Signed)
Pt's CBG result was 385. Informed Lupita Leash - RN. ?

## 2021-12-27 ENCOUNTER — Encounter (HOSPITAL_COMMUNITY): Payer: Self-pay | Admitting: Family Medicine

## 2021-12-27 DIAGNOSIS — E1110 Type 2 diabetes mellitus with ketoacidosis without coma: Secondary | ICD-10-CM | POA: Diagnosis not present

## 2021-12-27 DIAGNOSIS — K760 Fatty (change of) liver, not elsewhere classified: Secondary | ICD-10-CM | POA: Diagnosis present

## 2021-12-27 DIAGNOSIS — Z794 Long term (current) use of insulin: Secondary | ICD-10-CM | POA: Diagnosis not present

## 2021-12-27 DIAGNOSIS — E1310 Other specified diabetes mellitus with ketoacidosis without coma: Secondary | ICD-10-CM | POA: Diagnosis not present

## 2021-12-27 DIAGNOSIS — N12 Tubulo-interstitial nephritis, not specified as acute or chronic: Secondary | ICD-10-CM

## 2021-12-27 DIAGNOSIS — E1122 Type 2 diabetes mellitus with diabetic chronic kidney disease: Secondary | ICD-10-CM | POA: Diagnosis present

## 2021-12-27 DIAGNOSIS — Z9641 Presence of insulin pump (external) (internal): Secondary | ICD-10-CM | POA: Diagnosis present

## 2021-12-27 DIAGNOSIS — A419 Sepsis, unspecified organism: Secondary | ICD-10-CM

## 2021-12-27 DIAGNOSIS — R059 Cough, unspecified: Secondary | ICD-10-CM | POA: Diagnosis not present

## 2021-12-27 DIAGNOSIS — Z8249 Family history of ischemic heart disease and other diseases of the circulatory system: Secondary | ICD-10-CM | POA: Diagnosis not present

## 2021-12-27 DIAGNOSIS — Z888 Allergy status to other drugs, medicaments and biological substances status: Secondary | ICD-10-CM | POA: Diagnosis not present

## 2021-12-27 DIAGNOSIS — Z79899 Other long term (current) drug therapy: Secondary | ICD-10-CM | POA: Diagnosis not present

## 2021-12-27 DIAGNOSIS — K219 Gastro-esophageal reflux disease without esophagitis: Secondary | ICD-10-CM | POA: Diagnosis present

## 2021-12-27 DIAGNOSIS — E785 Hyperlipidemia, unspecified: Secondary | ICD-10-CM | POA: Diagnosis present

## 2021-12-27 DIAGNOSIS — Z20822 Contact with and (suspected) exposure to covid-19: Secondary | ICD-10-CM | POA: Diagnosis present

## 2021-12-27 DIAGNOSIS — R000 Tachycardia, unspecified: Secondary | ICD-10-CM | POA: Diagnosis present

## 2021-12-27 DIAGNOSIS — N1832 Chronic kidney disease, stage 3b: Secondary | ICD-10-CM | POA: Diagnosis present

## 2021-12-27 DIAGNOSIS — I471 Supraventricular tachycardia: Secondary | ICD-10-CM | POA: Diagnosis present

## 2021-12-27 DIAGNOSIS — I129 Hypertensive chronic kidney disease with stage 1 through stage 4 chronic kidney disease, or unspecified chronic kidney disease: Secondary | ICD-10-CM | POA: Diagnosis present

## 2021-12-27 DIAGNOSIS — M797 Fibromyalgia: Secondary | ICD-10-CM | POA: Diagnosis present

## 2021-12-27 DIAGNOSIS — N179 Acute kidney failure, unspecified: Secondary | ICD-10-CM | POA: Diagnosis present

## 2021-12-27 DIAGNOSIS — Z885 Allergy status to narcotic agent status: Secondary | ICD-10-CM | POA: Diagnosis not present

## 2021-12-27 DIAGNOSIS — D6959 Other secondary thrombocytopenia: Secondary | ICD-10-CM | POA: Diagnosis present

## 2021-12-27 DIAGNOSIS — E876 Hypokalemia: Secondary | ICD-10-CM | POA: Diagnosis present

## 2021-12-27 LAB — GLUCOSE, CAPILLARY
Glucose-Capillary: 154 mg/dL — ABNORMAL HIGH (ref 70–99)
Glucose-Capillary: 186 mg/dL — ABNORMAL HIGH (ref 70–99)
Glucose-Capillary: 189 mg/dL — ABNORMAL HIGH (ref 70–99)
Glucose-Capillary: 191 mg/dL — ABNORMAL HIGH (ref 70–99)
Glucose-Capillary: 197 mg/dL — ABNORMAL HIGH (ref 70–99)
Glucose-Capillary: 200 mg/dL — ABNORMAL HIGH (ref 70–99)
Glucose-Capillary: 249 mg/dL — ABNORMAL HIGH (ref 70–99)
Glucose-Capillary: 323 mg/dL — ABNORMAL HIGH (ref 70–99)
Glucose-Capillary: 340 mg/dL — ABNORMAL HIGH (ref 70–99)
Glucose-Capillary: 400 mg/dL — ABNORMAL HIGH (ref 70–99)

## 2021-12-27 LAB — BETA-HYDROXYBUTYRIC ACID
Beta-Hydroxybutyric Acid: 0.39 mmol/L — ABNORMAL HIGH (ref 0.05–0.27)
Beta-Hydroxybutyric Acid: 0.45 mmol/L — ABNORMAL HIGH (ref 0.05–0.27)

## 2021-12-27 LAB — BASIC METABOLIC PANEL
Anion gap: 7 (ref 5–15)
Anion gap: 9 (ref 5–15)
BUN: 22 mg/dL — ABNORMAL HIGH (ref 6–20)
BUN: 19 mg/dL (ref 6–20)
CO2: 27 mmol/L (ref 22–32)
CO2: 24 mmol/L (ref 22–32)
Calcium: 8.9 mg/dL (ref 8.9–10.3)
Calcium: 8.6 mg/dL — ABNORMAL LOW (ref 8.9–10.3)
Chloride: 102 mmol/L (ref 98–111)
Chloride: 100 mmol/L (ref 98–111)
Creatinine, Ser: 1.31 mg/dL — ABNORMAL HIGH (ref 0.44–1.00)
Creatinine, Ser: 1.15 mg/dL — ABNORMAL HIGH (ref 0.44–1.00)
GFR, Estimated: 48 mL/min — ABNORMAL LOW (ref >60)
GFR, Estimated: 56 mL/min — ABNORMAL LOW (ref >60)
Glucose, Bld: 179 mg/dL — ABNORMAL HIGH (ref 70–99)
Glucose, Bld: 195 mg/dL — ABNORMAL HIGH (ref 70–99)
Potassium: 3.9 mmol/L (ref 3.5–5.1)
Potassium: 3.6 mmol/L (ref 3.5–5.1)
Sodium: 136 mmol/L (ref 135–145)
Sodium: 133 mmol/L — ABNORMAL LOW (ref 135–145)

## 2021-12-27 LAB — CBC
HCT: 33.2 % — ABNORMAL LOW (ref 36.0–46.0)
Hemoglobin: 10.8 g/dL — ABNORMAL LOW (ref 12.0–15.0)
MCH: 32.1 pg (ref 26.0–34.0)
MCHC: 32.5 g/dL (ref 30.0–36.0)
MCV: 98.8 fL (ref 80.0–100.0)
Platelets: 111 10*3/uL — ABNORMAL LOW (ref 150–400)
RBC: 3.36 MIL/uL — ABNORMAL LOW (ref 3.87–5.11)
RDW: 13 % (ref 11.5–15.5)
WBC: 4.5 10*3/uL (ref 4.0–10.5)
nRBC: 0 % (ref 0.0–0.2)

## 2021-12-27 LAB — MRSA NEXT GEN BY PCR, NASAL: MRSA by PCR Next Gen: NOT DETECTED

## 2021-12-27 LAB — URINE CULTURE: Culture: NO GROWTH

## 2021-12-27 LAB — CREATININE, SERUM
Creatinine, Ser: 1.28 mg/dL — ABNORMAL HIGH (ref 0.44–1.00)
GFR, Estimated: 49 mL/min — ABNORMAL LOW (ref >60)

## 2021-12-27 LAB — HIV ANTIBODY (ROUTINE TESTING W REFLEX): HIV Screen 4th Generation wRfx: NONREACTIVE

## 2021-12-27 MED ORDER — SODIUM CHLORIDE 0.9 % IV SOLN
2.0000 g | INTRAVENOUS | Status: AC
  Administered 2021-12-27: 2 g via INTRAVENOUS
  Filled 2021-12-27: qty 12.5

## 2021-12-27 MED ORDER — LACTATED RINGERS IV SOLN
INTRAVENOUS | Status: DC
Start: 2021-12-27 — End: 2021-12-27

## 2021-12-27 MED ORDER — ACETAMINOPHEN 650 MG RE SUPP: 650.0000 mg | Freq: Four times a day (QID) | RECTAL | Status: DC | PRN

## 2021-12-27 MED ORDER — VANCOMYCIN HCL 2000 MG/400ML IV SOLN
2000.0000 mg | Freq: Once | INTRAVENOUS | Status: AC
  Administered 2021-12-27: 2000 mg via INTRAVENOUS
  Filled 2021-12-27: qty 400

## 2021-12-27 MED ORDER — INSULIN ASPART 100 UNIT/ML IJ SOLN: 0.0000 [IU] | Freq: Three times a day (TID) | INTRAMUSCULAR | Status: DC

## 2021-12-27 MED ORDER — CHLORHEXIDINE GLUCONATE CLOTH 2 % EX PADS: 6.0000 | MEDICATED_PAD | Freq: Every day | CUTANEOUS | Status: DC

## 2021-12-27 MED ORDER — INSULIN ASPART 100 UNIT/ML IJ SOLN
0.0000 [IU] | INTRAMUSCULAR | Status: DC
  Administered 2021-12-27: 2 [IU] via SUBCUTANEOUS
  Administered 2021-12-27: 7 [IU] via SUBCUTANEOUS
  Administered 2021-12-27: 9 [IU] via SUBCUTANEOUS
  Administered 2021-12-27: 3 [IU] via SUBCUTANEOUS
  Administered 2021-12-27: 2 [IU] via SUBCUTANEOUS
  Administered 2021-12-27 – 2021-12-28 (×2): 7 [IU] via SUBCUTANEOUS
  Administered 2021-12-28: 2 [IU] via SUBCUTANEOUS
  Administered 2021-12-28: 3 [IU] via SUBCUTANEOUS
  Administered 2021-12-28: 7 [IU] via SUBCUTANEOUS
  Administered 2021-12-28: 3 [IU] via SUBCUTANEOUS
  Administered 2021-12-28: 9 [IU] via SUBCUTANEOUS
  Administered 2021-12-29 (×2): 3 [IU] via SUBCUTANEOUS
  Administered 2021-12-29: 5 [IU] via SUBCUTANEOUS

## 2021-12-27 MED ORDER — METRONIDAZOLE 500 MG/100ML IV SOLN
500.0000 mg | Freq: Two times a day (BID) | INTRAVENOUS | Status: DC
  Administered 2021-12-27 – 2021-12-29 (×4): 500 mg via INTRAVENOUS
  Filled 2021-12-27 (×4): qty 100

## 2021-12-27 MED ORDER — ENOXAPARIN SODIUM 60 MG/0.6ML IJ SOSY
50.0000 mg | PREFILLED_SYRINGE | INTRAMUSCULAR | Status: DC
  Administered 2021-12-27 – 2021-12-29 (×3): 50 mg via SUBCUTANEOUS
  Filled 2021-12-27 (×3): qty 0.6

## 2021-12-27 MED ORDER — PROCHLORPERAZINE EDISYLATE 10 MG/2ML IJ SOLN
5.0000 mg | Freq: Four times a day (QID) | INTRAMUSCULAR | Status: AC | PRN
  Administered 2021-12-27 – 2021-12-29 (×3): 5 mg via INTRAVENOUS
  Filled 2021-12-27 (×3): qty 2

## 2021-12-27 MED ORDER — VANCOMYCIN HCL 1250 MG/250ML IV SOLN: 1250.0000 mg | INTRAVENOUS | Status: DC

## 2021-12-27 MED ORDER — LACTATED RINGERS IV SOLN: INTRAVENOUS | Status: AC

## 2021-12-27 MED ORDER — SODIUM CHLORIDE 0.9 % IV SOLN
2.0000 g | Freq: Three times a day (TID) | INTRAVENOUS | Status: DC
  Administered 2021-12-27: 2 g via INTRAVENOUS
  Filled 2021-12-27 (×2): qty 12.5

## 2021-12-27 MED ORDER — ACETAMINOPHEN 325 MG PO TABS
650.0000 mg | ORAL_TABLET | Freq: Four times a day (QID) | ORAL | Status: DC | PRN
  Administered 2021-12-27 – 2021-12-29 (×7): 650 mg via ORAL
  Filled 2021-12-27 (×7): qty 2

## 2021-12-27 MED ORDER — MAGNESIUM SULFATE 2 GM/50ML IV SOLN: 2.0000 g | Freq: Once | INTRAVENOUS | Status: DC

## 2021-12-27 MED ORDER — CHLORHEXIDINE GLUCONATE CLOTH 2 % EX PADS
6.0000 | MEDICATED_PAD | Freq: Every day | CUTANEOUS | Status: DC
  Administered 2021-12-27 – 2021-12-29 (×3): 6 via TOPICAL

## 2021-12-27 MED ORDER — MORPHINE SULFATE (PF) 2 MG/ML IV SOLN
2.0000 mg | INTRAVENOUS | Status: DC | PRN
  Administered 2021-12-27 – 2021-12-29 (×5): 2 mg via INTRAVENOUS
  Filled 2021-12-27 (×5): qty 1

## 2021-12-27 MED ORDER — KETOROLAC TROMETHAMINE 30 MG/ML IJ SOLN
30.0000 mg | Freq: Once | INTRAMUSCULAR | Status: AC
  Administered 2021-12-27: 30 mg via INTRAVENOUS
  Filled 2021-12-27: qty 1

## 2021-12-27 MED ORDER — METOPROLOL TARTRATE 25 MG PO TABS
25.0000 mg | ORAL_TABLET | Freq: Two times a day (BID) | ORAL | Status: DC
  Administered 2021-12-27: 25 mg via ORAL
  Filled 2021-12-27: qty 1

## 2021-12-27 MED ORDER — INSULIN GLARGINE-YFGN 100 UNIT/ML ~~LOC~~ SOLN
15.0000 [IU] | Freq: Every day | SUBCUTANEOUS | Status: DC
  Administered 2021-12-27 – 2021-12-28 (×2): 15 [IU] via SUBCUTANEOUS
  Filled 2021-12-27 (×2): qty 0.15

## 2021-12-27 NOTE — Plan of Care (Signed)
?  Problem: Metabolic: ?Goal: Ability to maintain appropriate glucose levels will improve ?Outcome: Progressing ?  ?Problem: Fluid Volume: ?Goal: Ability to achieve a balanced intake and output will improve ?Outcome: Not Progressing ?  ?Problem: Nutritional: ?Goal: Maintenance of adequate nutrition will improve ?Outcome: Not Progressing ?  ?Problem: Urinary Elimination: ?Goal: Ability to achieve and maintain adequate renal perfusion and functioning will improve ?Outcome: Not Progressing ?  ?

## 2021-12-27 NOTE — Progress Notes (Signed)
Pharmacy Antibiotic Note ? ?Alexis Campos is a 56 y.o. female admitted on 12/26/2021 with  right flank pain .  Pt reports taking ciprofloxacin for several days PTA with no improvement. Ceftriaxone empiric coverage initiated on admission, but antibiotics now being broadened. Pharmacy initially consulted to dose pip/tazo + vancomycin, but recommended cefepime/metronidazole instead of pip/tazo to reduce risk of AKI in combination with vancomycin. ? ?Pharmacy consulted to dose cefepime + vancomycin. ? ?Today, 12/27/21 ?WBC WNL ?SCr 1.15, CrCl ~70 mL/min ?Febrile in the past 24 hours ? ?Plan: ?Cefepime 2 g IV q8h  ?Vancomycin 2000 mg LD followed by 1250 mg IV q24h for estimated AUC of 454 ?Goal vancomycin AUC is 400-550. Check levels at steady state as needed ?Monitor renal function and culture data ? ?Height: 5\' 10"  (177.8 cm) ?Weight: 103.1 kg (227 lb 4.7 oz) ?IBW/kg (Calculated) : 68.5 ? ?Temp (24hrs), Avg:98.1 ?F (36.7 ?C), Min:97.6 ?F (36.4 ?C), Max:98.9 ?F (37.2 ?C) ? ?Recent Labs  ?Lab 12/26/21 ?1037 12/26/21 ?1245 12/26/21 ?2144 12/27/21 ?0131 12/27/21 ?0744  ?WBC 6.9  --   --  4.5  --   ?CREATININE 1.24*  --  1.26* 1.31*  1.28* 1.15*  ?LATICACIDVEN 2.0* 1.2 0.9  --   --   ?  ?Estimated Creatinine Clearance: 71 mL/min (A) (by C-G formula based on SCr of 1.15 mg/dL (H)).   ? ?Allergies  ?Allergen Reactions  ? Codeine Itching  ? Duloxetine Hcl Other (See Comments)  ?  Adverse effect - Tremor  ? Lisinopril Itching  ? Metoclopramide Hcl Nausea Only  ?  Restless leg ?  ? ? ?Antimicrobials this admission: ?ceftriaxone 5/12 >> 5/13 ?vancomycin 5/13 >>  ?Cefepime 5/13 >> ?Metronidazole 5/13 >> ? ?Dose adjustments this admission: ? ?Microbiology results: ?5/12 BCx: ngtd ?5/12 UCx: ngF  ? ?7/12, PharmD ?12/27/2021 4:45 PM ?

## 2021-12-27 NOTE — Progress Notes (Addendum)
Subjective: ?Patient admitted this morning, see detailed H&P by Dr Toniann Fail ?56 year old female with history of diabetes mellitus type 2 on insulin pump, history of SVT presented with fever, chills and dysuria.  In the ED CT scan abdomen showed right sided ascending UTI versus recently passed stone.  Patient also had anion gap metabolic acidosis and was started on IV insulin.  Patient started on IV ceftriaxone. ? ?Vitals:  ? 12/27/21 1300 12/27/21 1330  ?BP: 117/89   ?Pulse: 81   ?Resp: 20   ?Temp:  98 ?F (36.7 ?C)  ?SpO2: 95%   ? ? ? ? ?A/P ?Sepsis secondary to right-sided pyonephritis ?-Urine culture showed no growth ?-Patient is still hypotensive ?-We will discontinue IV ceftriaxone and start vancomycin and Zosyn ?-Follow blood culture results ? ?Early DKA ?-DKA has resolved ?-Patient currently on Lantus 15 units subcu daily ?-Continue sliding scale insulin with NovoLog ? ?History of SVT ?-Patient takes metoprolol 150 mg twice daily at home ?-Due to low blood pressure, will hold metoprolol. ? ? ? ?Meredeth Ide ?[facility] ? ?

## 2021-12-27 NOTE — H&P (Signed)
?History and Physical  ? ? ?Alexis Campos PYP:[id number] DOB: 12-24-1965 DOA: 12/26/2021 ? ?PCP: [facility] [place]  ?Patient coming from: Home. ? ?Chief Complaint: Right flank pain nausea vomiting. ? ?HPI: Alexis Campos is a 56 y.o. female with history of diabetes mellitus type 2 on insulin pump, history of SVT presents to the ER after patient had worsening pain fever chills and dysuria.  Patient symptoms started about 1 week ago with right flank pain and dysuria.  Since symptoms got worse she had gone to her primary care physician about 3 days ago and was given ceftriaxone shot and oral Cipro.  Despite taking which patient's symptoms got worse patient decided to come to the ER at med center.  Denies any chest pain or shortness of breath.  Pain is mostly in the right flank area. ? ?ED Course: In the ER patient was febrile tachycardic elevated lactic acid CT scan showing features concerning for right-sided ascending urinary tract infection versus recently passed stone.  Patient also had anion gap metabolic acidosis was started on insulin infusion and had blood cultures drawn and started on ceftriaxone for sepsis secondary pyelonephritis.  Fluid bolus admitted for further management. ? ?Review of Systems: As per HPI, rest all negative. ? ? ?Past Medical History:  ?Diagnosis Date  ? Anemia   ? Anemia 05/22/2014  ? Blood transfusion   ? Diabetes mellitus   ? diagnosed 1996-insulin and metformin  ? Endometriosis   ? Fibromyalgia   ? GERD (gastroesophageal reflux disease)   ? History of blood transfusion 12/09  ? [facility]  ? Hyperlipidemia   ? Hypertension   ? Neuromuscular disorder ([place])   ? fibromyalgia  ? Seasonal allergies   ? recent bronchitis-chest xray/ct scan-granuloma noted present since 2008  ? Sinusitis   ? Splenomegaly 05/22/2014  ? ? ?Past Surgical History:  ?Procedure Laterality Date  ? ABDOMINAL SURGERY    ? LOA for endomeriosis  ? APPENDECTOMY  05/1995  ? CESAREAN SECTION    ?  CHOLECYSTECTOMY  01/1995  ? COLONOSCOPY    ? lysis of adhesions  1996/ 2002  ? TONSILLECTOMY  09/1994  ? ? ? reports that she has never smoked. She has never used smokeless tobacco. She reports that she does not drink alcohol and does not use drugs. ? ?Allergies  ?Allergen Reactions  ? Codeine Itching  ? Duloxetine Hcl Other (See Comments)  ?  Adverse effect - Tremor  ? Lisinopril Other (See Comments)  ? Metoclopramide Hcl Nausea Only  ?  Restless leg ?  ? ? ?Family History  ?Problem Relation Age of Onset  ? Anemia Mother   ? Coronary artery disease Mother   ? CAD Father   ? CAD Brother   ? ? ?Prior to Admission medications   ?Medication Sig Start Date End Date Taking? Authorizing Provider  ?albuterol (VENTOLIN HFA) 108 (90 Base) MCG/ACT inhaler Inhale 1-2 puffs into the lungs every 6 (six) hours as needed (cough). ?Patient not taking: Reported on 05/19/2021 03/13/21   Serafina Royals, FNP  ?benzonatate (TESSALON) 100 MG capsule Take 1 capsule (100 mg total) by mouth every 8 (eight) hours. ?Patient not taking: Reported on 05/19/2021 03/13/21   Serafina Royals, [place]  ?ciprofloxacin (CIPRO) 500 MG tablet Take 1 tablet (500 mg total) by mouth every 12 (twelve) hours for 7 days. 12/25/21     ?Continuous Blood Gluc Receiver (DEXCOM G6 RECEIVER) DEVI Use as directed. 01/21/21     ?Continuous Blood Gluc Sensor (DEXCOM G6  SENSOR) MISC Use as directed every 10 days 09/24/21     ?Continuous Blood Gluc Transmit (DEXCOM G6 TRANSMITTER) MISC Use as directed 12/29/20     ?ergocalciferol (VITAMIN D2) 1.25 MG (50000 UT) capsule Take 1 capsule (50,000 Units total) by mouth once a week. ?Patient not taking: Reported on 05/19/2021 02/06/21     ?ergocalciferol (VITAMIN D2) 50000 UNITS capsule Take 50,000 Units by mouth once a week. On Wednesdays.    [provider]  ?escitalopram (LEXAPRO) 20 MG tablet TAKE 1 TABLET BY MOUTH ONCE DAILY 11/12/20 11/12/21  Jacelyn Pi, MD  ?fenofibrate (TRICOR) 145 MG tablet TAKE 1 TABLET BY MOUTH ONCE DAILY  11/12/20 12/21/21  Jacelyn Pi, MD  ?icosapent Ethyl (VASCEPA) 1 g capsule Take 2 capsules (2 g total) by mouth 2 (two) times daily. 02/05/21   Nahser, Wonda Cheng, MD  ?Insulin Aspart (NOVOLOG [place]) Inject 2-25 Units into the skin 3 (three) times daily. Sliding scale insulin, depending on blood sugar reading & carbs eaten, and with snacks ?Patient not taking: Reported on 05/19/2021    [provider]  ?insulin degludec (TRESIBA FLEXTOUCH) 100 UNIT/ML FlexTouch Pen Inject 15 Units into the skin daily. 03/31/21     ?Insulin Disposable Pump (OMNIPOD 5 G6 INTRO, GEN 5,) KIT Use as directed 02/27/21     ?Insulin Disposable Pump (OMNIPOD 5 G6 POD, GEN 5,) MISC Use as directed every 2 days 02/27/21     ?insulin lispro (HUMALOG) 100 UNIT/ML injection Inject up to 100 units/day under the skin via pump. 05/01/20   Jacelyn Pi, MD  ?insulin lispro (HUMALOG) 100 UNIT/ML injection Inject 1 mL (100 Units total) into the skin daily via insulin pump 09/18/21     ?ivabradine (CORLANOR) 5 MG TABS tablet Take 73m (3 tablets) two hours prior before cardiac CT for a target heart rate less than 60bpm. ?Patient not taking: Reported on 05/19/2021 02/19/21   AKate Sable MD  ?meloxicam (MOBIC) 15 MG tablet Take 1 tablet (15 mg total) by mouth daily. 09/16/21   EEdrick Kins DPM  ?metoprolol tartrate (LOPRESSOR) 50 MG tablet TAKE 3 TABLETS BY MOUTH TWICE DAILY. 02/22/20 05/22/21  KJani Gravel MD  ?metoprolol tartrate (LOPRESSOR) 50 MG tablet Take 3 tablets (150 mg total) by mouth 2 (two) times daily. 05/27/21     ?metroNIDAZOLE (METROGEL) 0.75 % gel Apply to face twice daily as maintenance 09/15/21     ?ondansetron (ZOFRAN) 8 MG tablet Take 1 tablet (8 mg total) by mouth every 8 (eight) hours as needed. 12/25/21     ?ondansetron (ZOFRAN-ODT) 4 MG disintegrating tablet Take 1 tablet (4 mg total) by mouth every 8 (eight) hours as needed for nausea or vomiting. 07/15/18   CCoral Spikes DO  ?ondansetron (ZOFRAN-ODT) 8 MG disintegrating tablet  Take 1 tablet (8 mg total) on the tounge and allow to dissolve as needed by mouth 3 (three) times daily. ?Patient not taking: Reported on 05/19/2021 01/21/21     ?rosuvastatin (CRESTOR) 10 MG tablet Take 1 tablet (10 mg total) by mouth daily. 02/20/21   Nahser, PWonda Cheng MD  ?Vitamin D, Ergocalciferol, (DRISDOL) 1.25 MG (50000 UNIT) CAPS capsule Take 1 capsule (50,000 Units total) by mouth once a week. 05/30/21     ? ? ?Physical Exam: ?Constitutional: Moderately built and nourished. ?Vitals:  ? 12/26/21 2130 12/26/21 2200 12/26/21 2224 12/26/21 2230  ?BP: 101/65 116/69  (!) 114/56  ?Pulse: 98 91 91 91  ?Resp: (!) _0 ?Temp:  97.9 ?F (36.6 ?C)   ?TempSrc:   Rectal   ?SpO2: 98% 99% 99% 99%  ?Weight:      ?Height:      ? ?Eyes: Anicteric no pallor. ?ENMT: No discharge from ears eyes nose and mouth. ?Neck: No mass felt.  No neck rigidity. ?Respiratory: No rhonchi or crepitations. ?Cardiovascular: S1-S2 heard. ?Abdomen: Soft nontender bowel sound present. ?Musculoskeletal: No edema. ?Skin: No rash. ?Neurologic: Alert awake oriented to time place and person.  Moves all extremities. ?Psychiatric: Appears normal.  Normal affect. ? ? ?Labs on Admission: I have personally reviewed following labs and imaging studies ? ?CBC: ?Recent Labs  ?Lab 12/26/21 ?1037 12/26/21 ?1633  ?WBC 6.9  --   ?NEUTROABS 6.0  --   ?HGB 12.2 10.5*  ?HCT 37.4 31.0*  ?MCV 94.9  --   ?PLT 132*  --   ? ?Basic Metabolic Panel: ?Recent Labs  ?Lab 12/26/21 ?1037 12/26/21 ?1633 12/26/21 ?2144  ?NA 130* 131* 135  ?K 4.0 4.7 4.2  ?CL 92*  --  102  ?CO2 20*  --  25  ?GLUCOSE 387*  --  181*  ?BUN 19  --  21*  ?CREATININE 1.24*  --  1.26*  ?CALCIUM 9.7  --  8.7*  ?MG 1.5*  --   --   ? ?GFR: ?Estimated Creatinine Clearance: 64.8 mL/min (A) (by C-G formula based on SCr of 1.26 mg/dL (H)). ?Liver Function Tests: ?Recent Labs  ?Lab 12/26/21 ?1037  ?AST 18  ?ALT 27  ?ALKPHOS 74  ?BILITOT 0.7  ?PROT 7.5  ?ALBUMIN 3.7  ? ?No results for input(s): LIPASE, AMYLASE in  the last 168 hours. ?No results for input(s): AMMONIA in the last 168 hours. ?Coagulation Profile: ?Recent Labs  ?Lab 12/26/21 ?1037  ?INR 1.0  ? ?Cardiac Enzymes: ?No results for input(s): CKTOTAL, CKMB, CKMBIN

## 2021-12-27 NOTE — Progress Notes (Deleted)
Notified by tele that patient had a 6 beat run of SVT at 2300.  ?

## 2021-12-28 LAB — GLUCOSE, CAPILLARY
Glucose-Capillary: 192 mg/dL — ABNORMAL HIGH (ref 70–99)
Glucose-Capillary: 216 mg/dL — ABNORMAL HIGH (ref 70–99)
Glucose-Capillary: 250 mg/dL — ABNORMAL HIGH (ref 70–99)
Glucose-Capillary: 308 mg/dL — ABNORMAL HIGH (ref 70–99)
Glucose-Capillary: 318 mg/dL — ABNORMAL HIGH (ref 70–99)
Glucose-Capillary: 371 mg/dL — ABNORMAL HIGH (ref 70–99)

## 2021-12-28 LAB — COMPREHENSIVE METABOLIC PANEL
ALT: 70 U/L — ABNORMAL HIGH (ref 0–44)
AST: 49 U/L — ABNORMAL HIGH (ref 15–41)
Albumin: 2.6 g/dL — ABNORMAL LOW (ref 3.5–5.0)
Alkaline Phosphatase: 173 U/L — ABNORMAL HIGH (ref 38–126)
Anion gap: 11 (ref 5–15)
BUN: 35 mg/dL — ABNORMAL HIGH (ref 6–20)
CO2: 23 mmol/L (ref 22–32)
Calcium: 8.7 mg/dL — ABNORMAL LOW (ref 8.9–10.3)
Chloride: 102 mmol/L (ref 98–111)
Creatinine, Ser: 1.59 mg/dL — ABNORMAL HIGH (ref 0.44–1.00)
GFR, Estimated: 38 mL/min — ABNORMAL LOW (ref >60)
Glucose, Bld: 256 mg/dL — ABNORMAL HIGH (ref 70–99)
Potassium: 3.6 mmol/L (ref 3.5–5.1)
Sodium: 136 mmol/L (ref 135–145)
Total Bilirubin: 0.5 mg/dL (ref 0.3–1.2)
Total Protein: 6.1 g/dL — ABNORMAL LOW (ref 6.5–8.1)

## 2021-12-28 LAB — CBC
HCT: 31.3 % — ABNORMAL LOW (ref 36.0–46.0)
Hemoglobin: 9.8 g/dL — ABNORMAL LOW (ref 12.0–15.0)
MCH: 31.6 pg (ref 26.0–34.0)
MCHC: 31.3 g/dL (ref 30.0–36.0)
MCV: 101 fL — ABNORMAL HIGH (ref 80.0–100.0)
Platelets: 100 10*3/uL — ABNORMAL LOW (ref 150–400)
RBC: 3.1 MIL/uL — ABNORMAL LOW (ref 3.87–5.11)
RDW: 13.1 % (ref 11.5–15.5)
WBC: 3.9 10*3/uL — ABNORMAL LOW (ref 4.0–10.5)
nRBC: 0 % (ref 0.0–0.2)

## 2021-12-28 LAB — MAGNESIUM: Magnesium: 2 mg/dL (ref 1.7–2.4)

## 2021-12-28 LAB — BRAIN NATRIURETIC PEPTIDE: B Natriuretic Peptide: 219.4 pg/mL — ABNORMAL HIGH (ref 0.0–100.0)

## 2021-12-28 MED ORDER — INSULIN GLARGINE-YFGN 100 UNIT/ML ~~LOC~~ SOLN
5.0000 [IU] | Freq: Once | SUBCUTANEOUS | Status: AC
  Administered 2021-12-28: 5 [IU] via SUBCUTANEOUS
  Filled 2021-12-28: qty 0.05

## 2021-12-28 MED ORDER — SALINE SPRAY 0.65 % NA SOLN
1.0000 | NASAL | Status: DC | PRN
  Filled 2021-12-28: qty 44

## 2021-12-28 MED ORDER — INSULIN GLARGINE-YFGN 100 UNIT/ML ~~LOC~~ SOLN
20.0000 [IU] | Freq: Every day | SUBCUTANEOUS | Status: DC
  Administered 2021-12-29: 20 [IU] via SUBCUTANEOUS
  Filled 2021-12-28: qty 0.2

## 2021-12-28 MED ORDER — DIPHENHYDRAMINE HCL 25 MG PO CAPS
25.0000 mg | ORAL_CAPSULE | Freq: Once | ORAL | Status: AC
  Administered 2021-12-28: 25 mg via ORAL
  Filled 2021-12-28: qty 1

## 2021-12-28 MED ORDER — GUAIFENESIN 100 MG/5ML PO LIQD
5.0000 mL | ORAL | Status: DC | PRN
  Administered 2021-12-28 (×3): 5 mL via ORAL
  Filled 2021-12-28 (×3): qty 10

## 2021-12-28 MED ORDER — LIP MEDEX EX OINT
TOPICAL_OINTMENT | CUTANEOUS | Status: DC | PRN
  Administered 2021-12-28: 1 via TOPICAL
  Filled 2021-12-28: qty 7

## 2021-12-28 MED ORDER — FUROSEMIDE 20 MG PO TABS
20.0000 mg | ORAL_TABLET | Freq: Once | ORAL | Status: AC
  Administered 2021-12-28: 20 mg via ORAL
  Filled 2021-12-28: qty 1

## 2021-12-28 MED ORDER — SODIUM CHLORIDE 0.9 % IV SOLN
2.0000 g | Freq: Two times a day (BID) | INTRAVENOUS | Status: DC
  Administered 2021-12-28 – 2021-12-29 (×3): 2 g via INTRAVENOUS
  Filled 2021-12-28 (×3): qty 12.5

## 2021-12-28 MED ORDER — SODIUM CHLORIDE 0.9 % IV SOLN
INTRAVENOUS | Status: DC | PRN
Start: 2021-12-28 — End: 2021-12-29

## 2021-12-28 MED ORDER — VANCOMYCIN HCL IN DEXTROSE 1-5 GM/200ML-% IV SOLN
1000.0000 mg | INTRAVENOUS | Status: DC
  Administered 2021-12-28: 1000 mg via INTRAVENOUS
  Filled 2021-12-28: qty 200

## 2021-12-28 MED ORDER — BENZONATATE 100 MG PO CAPS
200.0000 mg | ORAL_CAPSULE | Freq: Three times a day (TID) | ORAL | Status: DC | PRN
  Administered 2021-12-28 – 2021-12-29 (×2): 200 mg via ORAL
  Filled 2021-12-28 (×2): qty 2

## 2021-12-28 NOTE — Plan of Care (Signed)
?  Problem: Nutritional: ?Goal: Maintenance of adequate nutrition will improve ?Outcome: Progressing ?  ?Problem: Respiratory: ?Goal: Will regain and/or maintain adequate ventilation ?Outcome: Progressing ?  ?

## 2021-12-28 NOTE — Progress Notes (Signed)
?  Transition of Care (TOC) Screening Note ? ? ?Patient Details  ?Name: Alexis Campos ?Date of Birth: 05/18/1966 ? ? ?Transition of Care (TOC) CM/SW Contact:    ?Nickol Collister, LCSW ?Phone Number: ?12/28/2021, 3:49 PM ? ? ? ?Transition of Care Department [facility]) has reviewed patient and no TOC needs have been identified at this time. We will continue to monitor patient advancement through interdisciplinary progression rounds. If new patient transition needs arise, please place a TOC consult. ? ? ?

## 2021-12-28 NOTE — Plan of Care (Signed)
?  Problem: Respiratory: ?Goal: Will regain and/or maintain adequate ventilation ?Outcome: Progressing ?  ?Problem: Education: ?Goal: Knowledge of General Education information will improve ?Description: Including pain rating scale, medication(s)/side effects and non-pharmacologic comfort measures ?Outcome: Progressing ?  ?Problem: Coping: ?Goal: Level of anxiety will decrease ?Outcome: Progressing ?  ?Problem: Elimination: ?Goal: Will not experience complications related to urinary retention ?Outcome: Progressing ?  ?Problem: Pain Managment: ?Goal: General experience of comfort will improve ?Outcome: Progressing ?  ?Problem: Safety: ?Goal: Ability to remain free from injury will improve ?Outcome: Progressing ?  ?Problem: Skin Integrity: ?Goal: Risk for impaired skin integrity will decrease ?Outcome: Progressing ?  ?Problem: Respiratory: ?Goal: Ability to maintain adequate ventilation will improve ?Outcome: Progressing ?  ?

## 2021-12-28 NOTE — Progress Notes (Signed)
Pharmacy Antibiotic Note ? ?DALLAS SCORSONE is a 56 y.o. female admitted on 12/26/2021 with  right flank pain .  Pt reports taking ciprofloxacin for several days PTA with no improvement. Ceftriaxone empiric coverage initiated on admission, but antibiotics now being broadened. Pharmacy initially consulted to dose pip/tazo + vancomycin, but recommended cefepime/metronidazole instead of pip/tazo to reduce risk of AKI in combination with vancomycin. ? ?Pharmacy consulted to dose cefepime + vancomycin. ? ?12/28/2021 ?D#3 abx. SCr up to 1.59, CrCl 51 ml/min ?WBC down to 3.9, T max 99.3 ? ?Plan: ?Decrease Cefepime to 2 g IV q12h  ?Decrease vancomycin to 1000 mg IV q24h for estimated AUC of 486.7, using SCr 1.59, Vd 0.5 ?Goal vancomycin AUC is 400-550. Check levels at steady state as needed ?Flagyl 500 mg IV q12 per MD ?Monitor renal function and culture data ? ?Height: 5\' 10"  (177.8 cm) ?Weight: 103.1 kg (227 lb 4.7 oz) ?IBW/kg (Calculated) : 68.5 ? ?Temp (24hrs), Avg:98.2 ?F (36.8 ?C), Min:97.5 ?F (36.4 ?C), Max:99.3 ?F (37.4 ?C) ? ?Recent Labs  ?Lab 12/26/21 ?1037 12/26/21 ?1245 12/26/21 ?2144 12/27/21 ?0131 12/27/21 ?12/29/21 12/28/21 ?12/30/21  ?WBC 6.9  --   --  4.5  --  3.9*  ?CREATININE 1.24*  --  1.26* 1.31*  1.28* 1.15* 1.59*  ?LATICACIDVEN 2.0* 1.2 0.9  --   --   --   ? ?  ?Estimated Creatinine Clearance: 51.3 mL/min (A) (by C-G formula based on SCr of 1.59 mg/dL (H)).   ? ?Allergies  ?Allergen Reactions  ? Codeine Itching  ? Duloxetine Hcl Other (See Comments)  ?  Adverse effect - Tremor  ? Lisinopril Itching  ? Metoclopramide Hcl Nausea Only  ?  Restless leg ?  ?Antimicrobials this admission: ?ceftriaxone 5/12 >> 5/13 ?vancomycin 5/13 >>  ?Cefepime 5/13 >> ?Metronidazole 5/13 >>  ?Dose adjustments this admission: ?5/14 cefepime q8> q12 ?5/14 Vanc 1250 q24>  1 gm q24 ?Microbiology results: ?5/12 BCx: ngtd ?5/12 UCx: ngF ( got CTX IM & PO cipro prior to Cx)  ?5/12 MRSA - ?5/13 HIV NR ? ?6/13,  Pharm.D ?12/28/2021 7:31 AM ? ?

## 2021-12-28 NOTE — Progress Notes (Signed)
[facility] ? ?PROGRESS NOTE ? ?Alexis Campos [id number] DOB: Jul 10, 1966 DOA: 12/26/2021 ?PCP: [facility] [place] ? ? ?Brief HPI:   ?56 year old female with history of diabetes mellitus type 2 on insulin pump, history of SVT presented with fever, chills and dysuria.  In the ED CT scan abdomen showed right sided ascending UTI versus recently passed stone.  Patient also had anion gap metabolic acidosis and was started on IV insulin.  Patient started on IV ceftriaxone. ?  ? ? ? ?Subjective  ? ?Patient seen and examined, feels better today.  Antibiotics were changed yesterday because of hypotension and low-grade fever.  Urine culture showed no growth. ? ? Assessment/Plan:  ? ? ? ?Sepsis, presumed right-sided pyelonephritis ?-Urine culture showed no growth ?-Patient had a urine culture drawn on Thursday at PCP office and was given IM Rocephin x1 ?-I have asked the patient to get urine culture from PCP office ?-Continue empiric antibiotics vancomycin, cefepime, Flagyl ?-Follow blood culture results ? ?Early DKA ?-Patient started on DKA protocol with IV insulin ?-DKA has resolved ?-IV insulin discontinued; patient started on Lantus and sliding scale insulin with NovoLog ? ?Diabetes mellitus type 2 ?-CBG mildly elevated ?-We will increase Lantus to 20 units subcu daily ? ? ?History of SVT ?-Patient takes metoprolol 150 mg twice a day at home ?-Metoprolol is currently on hold as patient was hypotensive yesterday ? ?Mild transaminitis ?-Patient had mildly elevated liver enzymes, likely due to shock liver from hypotension ?-CT abdomen showed no abnormality in liver ?-She was status post cholecystectomy ?-Follow LFTs in a.m. if still elevated will consider abdominal ultrasound ? ?Chronic kidney disease stage IIIb ?-Creatinine 1.59 ?-?  At baseline ? ?Hypomagnesemia ?-Magnesium 1.5, 2 days ago ?-Magnesium was replaced with 2 g mag sulfate ?-We will repeat magnesium today and replace if it is still  low ? ? ? ?Medications ? ?  ? Chlorhexidine Gluconate Cloth  6 each Topical Daily  ? enoxaparin (LOVENOX) injection  50 mg Subcutaneous Q24H  ? insulin aspart  0-9 Units Subcutaneous Q4H  ? insulin glargine-yfgn  15 Units Subcutaneous Daily  ? ? ? Data Reviewed:  ? ?CBG: ? ?Recent Labs  ?Lab 12/27/21 ?2019 12/27/21 ?2342 12/28/21 ?0408 12/28/21 ?[id number] 12/28/21 ?1129  ?GLUCAP 400* 323* 216* 192* 308*  ? ? ?SpO2: 100 % ?O2 Flow Rate (L/min): 2 L/min  ? ? ?Vitals:  ? 12/28/21 1000 12/28/21 1100 12/28/21 1200 12/28/21 1234  ?BP:   (!) 165/74   ?Pulse: 94 85 89 92  ?Resp: (!) 23 18 19  (!) 22  ?Temp:    98.3 ?F (36.8 ?C)  ?TempSrc:    Oral  ?SpO2: 99% 99% 100% 100%  ?Weight:      ?Height:      ? ? ? ? ?Data Reviewed: ? ?Basic Metabolic Panel: ?Recent Labs  ?Lab 12/26/21 ?1037 12/26/21 ?1633 12/26/21 ?2144 12/27/21 ?0131 12/27/21 ?[id number] 12/28/21 ?[id number]  ?NA 130* 131* 135 136 133* 136  ?K 4.0 4.7 4.2 3.9 3.6 3.6  ?CL 92*  --  102 102 100 102  ?CO2 20*  --  25 27 24 23   ?GLUCOSE 387*  --  181* 179* 195* 256*  ?BUN 19  --  21* 22* 19 35*  ?CREATININE 1.24*  --  1.26* 1.31*  1.28* 1.15* 1.59*  ?CALCIUM 9.7  --  8.7* 8.9 8.6* 8.7*  ?MG 1.5*  --   --   --   --   --   ? ? ?CBC: ?Recent  Labs  ?Lab 12/26/21 ?1037 12/26/21 ?1633 12/27/21 ?0131 12/28/21 ?[id number]  ?WBC 6.9  --  4.5 3.9*  ?NEUTROABS 6.0  --   --   --   ?HGB 12.2 10.5* 10.8* 9.8*  ?HCT 37.4 31.0* 33.2* 31.3*  ?MCV 94.9  --  98.8 101.0*  ?PLT 132*  --  111* 100*  ? ? ?LFT ?Recent Labs  ?Lab 12/26/21 ?1037 12/28/21 ?0307  ?AST 18 49*  ?ALT 27 70*  ?ALKPHOS 74 173*  ?BILITOT 0.7 0.5  ?PROT 7.5 6.1*  ?ALBUMIN 3.7 2.6*  ? ?  ?Antibiotics: ?Anti-infectives (From admission, onward)  ? ? Start     Dose/Rate Route Frequency Ordered Stop  ? 12/28/21 1800  vancomycin (VANCOREADY) IVPB 1250 mg/250 mL  Status:  Discontinued       ? 1,250 mg ?166.7 mL/hr over 90 Minutes Intravenous Every 24 hours 12/27/21 1655 12/28/21 0725  ? 12/28/21 1800  vancomycin (VANCOCIN) IVPB 1000 mg/200 mL  premix       ? 1,000 mg ?200 mL/hr over 60 Minutes Intravenous Every 24 hours 12/28/21 0725    ? 12/28/21 1200  ceFEPIme (MAXIPIME) 2 g in sodium chloride 0.9 % 100 mL IVPB       ? 2 g ?200 mL/hr over 30 Minutes Intravenous Every 12 hours 12/28/21 0716    ? 12/27/21 2330  ceFEPIme (MAXIPIME) 2 g in sodium chloride 0.9 % 100 mL IVPB  Status:  Discontinued       ? 2 g ?200 mL/hr over 30 Minutes Intravenous Every 8 hours 12/27/21 1655 12/28/21 0716  ? 12/27/21 1800  metroNIDAZOLE (FLAGYL) IVPB 500 mg       ? 500 mg ?100 mL/hr over 60 Minutes Intravenous Every 12 hours 12/27/21 1631    ? 12/27/21 1730  ceFEPIme (MAXIPIME) 2 g in sodium chloride 0.9 % 100 mL IVPB       ? 2 g ?200 mL/hr over 30 Minutes Intravenous STAT 12/27/21 1631 12/27/21 1722  ? 12/27/21 1730  vancomycin (VANCOREADY) IVPB 2000 mg/400 mL       ? 2,000 mg ?200 mL/hr over 120 Minutes Intravenous  Once 12/27/21 1633 12/27/21 2042  ? 12/26/21 1030  cefTRIAXone (ROCEPHIN) 2 g in sodium chloride 0.9 % 100 mL IVPB  Status:  Discontinued       ? 2 g ?200 mL/hr over 30 Minutes Intravenous Every 24 hours 12/26/21 1029 12/27/21 1610  ? ?  ? ? ? ?DVT prophylaxis: Lovenox ? ?Code Status: Full code ? ?Family Communication: No family at bedside ? ? ?CONSULTS  ? ? ?Objective  ? ? ?Physical Examination: ? ? ?General-appears in no acute distress ?Heart-S1-S2, regular, no murmur auscultated ?Lungs-clear to auscultation bilaterally, no wheezing or crackles auscultated ?Abdomen-soft, positive right CVA tenderness , no organomegaly ?Extremities-no edema in the lower extremities ?Neuro-alert, oriented x3, no focal deficit noted ? ?Status is: Inpatient:   ? ? ? ?  ? ? ?Oswald Hillock ?  ?[facility] ?If 7PM-7AM, please contact night-coverage at [email or web address], ?Office  [phone] ? ? ?12/28/2021, 1:23 PM  LOS: 1 day  ? ? ? ? ? ? ? ? ? ? ?  ?

## 2021-12-29 ENCOUNTER — Inpatient Hospital Stay (HOSPITAL_COMMUNITY): Payer: 59

## 2021-12-29 DIAGNOSIS — E1310 Other specified diabetes mellitus with ketoacidosis without coma: Secondary | ICD-10-CM

## 2021-12-29 LAB — GLUCOSE, CAPILLARY
Glucose-Capillary: 212 mg/dL — ABNORMAL HIGH (ref 70–99)
Glucose-Capillary: 219 mg/dL — ABNORMAL HIGH (ref 70–99)
Glucose-Capillary: 283 mg/dL — ABNORMAL HIGH (ref 70–99)

## 2021-12-29 LAB — BASIC METABOLIC PANEL
Anion gap: 11 (ref 5–15)
BUN: 26 mg/dL — ABNORMAL HIGH (ref 6–20)
CO2: 22 mmol/L (ref 22–32)
Calcium: 8.6 mg/dL — ABNORMAL LOW (ref 8.9–10.3)
Chloride: 101 mmol/L (ref 98–111)
Creatinine, Ser: 1.06 mg/dL — ABNORMAL HIGH (ref 0.44–1.00)
GFR, Estimated: >60 mL/min (ref >60)
Glucose, Bld: 242 mg/dL — ABNORMAL HIGH (ref 70–99)
Potassium: 3.2 mmol/L — ABNORMAL LOW (ref 3.5–5.1)
Sodium: 134 mmol/L — ABNORMAL LOW (ref 135–145)

## 2021-12-29 MED ORDER — METOPROLOL TARTRATE 25 MG PO TABS: 50.0000 mg | ORAL_TABLET | Freq: Two times a day (BID) | ORAL | Status: DC

## 2021-12-29 MED ORDER — POTASSIUM CHLORIDE CRYS ER 20 MEQ PO TBCR
40.0000 meq | EXTENDED_RELEASE_TABLET | Freq: Once | ORAL | Status: AC
  Administered 2021-12-29: 40 meq via ORAL
  Filled 2021-12-29: qty 2

## 2021-12-29 NOTE — Consult Note (Signed)
? ?  [facility] CM Inpatient Consult ? ? ?12/29/2021 ? ?Duard Brady ?[birth year] ?[id number] ? ? [facility] Customer service manager [THN]  Occupational hygienist [ACO] Patient: [employer] Employee plan ? ?Primary Care Provider:  [facility], is an Independent Embedded provider that is listed to provide the transition of care post hospital follow up and call. ?Call attempt to patient's room to assess for post hospital needs was not answered.  Reviewed for additional post hospital care coordination needs and assessed for no additional needs at this time. ? ?Plan:  MD office for [facility] calls  ?For additional questions or referrals please contact: ?  ?Charlesetta Shanks, RN BSN CCM ?[facility] [employer] Liaison ? [phone] business mobile phone ?Toll free office [phone]  ?Fax number: [phone] ?[email or web address] ?[email or web address] ? ? ?

## 2021-12-29 NOTE — Discharge Summary (Addendum)
?Physician Discharge Summary ?  ?Patient: Alexis Campos MRN: [id number] DOB: 07/11/1966  ?Admit date:     12/26/2021  ?Discharge date: 12/29/21  ?Discharge Physician: Oswald Hillock  ? ?PCP: [facility] [place]  ? ?Recommendations at discharge:  ? ?Follow-up PCP on 01/02/2022 and get BMP checked ?[facility] ?Consider abdominal ultrasound as outpatient for evaluation of hepatic steatosis  ? ? ?Discharge Diagnoses: ?Principal Problem: ?  Sepsis ([place]) ?Active Problems: ?  Diabetes mellitus type 2 with ketoacidosis ([place]) ?  Pyelonephritis ? ?Resolved Problems: ?  * No resolved hospital problems. * ? ?Hospital Course: ?56 year old female with history of diabetes mellitus type 2 on insulin pump, history of SVT presented with fever, chills and dysuria.  In the ED CT scan abdomen showed right sided ascending UTI versus recently passed stone.  Patient also had anion gap metabolic acidosis and was started on IV insulin.  Patient started on IV ceftriaxone. ?  ?  ?  ? ?Assessment and Plan: ? ? ?Sepsis, presumed right-sided pyelonephritis ?-Urine culture showed no growth ?-Sepsis physiology has resolved ?-Patient had a urine culture drawn on Thursday at PCP office and was given IM Rocephin x1 ?-Urine culture showed no growth ?-Blood cultures x2 showed no growth till date ?-Patient was started on empiric antibiotics, vancomycin, cefepime and Flagyl ?-At this time she is afebrile, vitals are stable ?-Patient will be discharged home on Cipro which was prescribed to her earlier by her PCP for 7 days ? ?Early DKA ?-Patient started on DKA protocol with IV insulin ?-DKA has resolved ?-IV insulin discontinued; patient started on Lantus and sliding scale insulin with NovoLog ? ? Diabetes mellitus type 2 ?-Continue home regimen ?  ? History of SVT ?-Patient takes metoprolol 150 mg twice a day at home ?-Metoprolol was held as patient was hypotensive in the hospital  ?-We will discharge on low-dose  of metoprolol 50 mg p.o. twice daily. ?  ?Mild transaminitis ?-Abdominal ultrasound from 2015 showed hepatic steatosis ?-Patient had mildly elevated liver enzymes ?-CT abdomen showed no abnormality in liver ?-She was status post cholecystectomy ?-Recommend to get abdominal ultrasound as outpatient ?  ?Acute kidney injury versus CKD stage IIIa  ?-Creatinine improved after diuresis with Lasix 20 mg x 1 dose ?-BNP was elevated mildly at 219 ?-Creatinine today down to 1.01 ?-Recommend to follow-up with PCP in 5 days to check BMP ? ?Hypomagnesemia ?-Replete ? ?Hypokalemia ?-Potassium is 3.2.  We will replace potassium before discharge ?-Check BMP in 4 days ?  ?  ? ?  ? ? ?Consultants:  ?Procedures performed:   ?Disposition: Home ?Diet recommendation:  ?Discharge Diet Orders (From admission, onward)  ? ?  Start     Ordered  ? 12/29/21 0000  Diet - low sodium heart healthy       ? 12/29/21 1113  ? ?  ?  ? ?  ? ?Carb modified diet ?DISCHARGE MEDICATION: ?Allergies as of 12/29/2021   ? ?   Reactions  ? Codeine Itching  ? Duloxetine Hcl Other (See Comments)  ? Adverse effect - Tremor  ? Lisinopril Itching  ? Metoclopramide Hcl Nausea Only  ? Restless leg  ? ?  ? ?  ?Medication List  ?  ? ?STOP taking these medications   ? ?ondansetron 8 MG disintegrating tablet ?Commonly known as: ZOFRAN-ODT ?  ? ?  ? ?TAKE these medications   ? ?albuterol 108 (90 Base) MCG/ACT inhaler ?Commonly known as: VENTOLIN HFA ?Inhale 1-2 puffs into  the lungs every 6 (six) hours as needed (cough). ?  ?ciprofloxacin 500 MG tablet ?Commonly known as: CIPRO ?Take 1 tablet (500 mg total) by mouth every 12 (twelve) hours for 7 days. ?  ?Dexcom G6 Receiver Devi ?Use as directed. ?  ?Dexcom G6 Sensor Misc ?Use as directed every 10 days ?  ?Dexcom G6 Transmitter Misc ?Use as directed ?  ?diphenhydramine-acetaminophen 25-500 MG Tabs tablet ?Commonly known as: TYLENOL PM ?Take 2 tablets by mouth at bedtime as needed (sleep). ?  ?escitalopram 20 MG  tablet ?Commonly known as: LEXAPRO ?TAKE 1 TABLET BY MOUTH ONCE DAILY ?What changed: how much to take ?  ?fenofibrate 145 MG tablet ?Commonly known as: TRICOR ?TAKE 1 TABLET BY MOUTH ONCE DAILY ?What changed: how much to take ?  ?HumaLOG 100 UNIT/ML injection ?Generic drug: insulin lispro ?Inject up to 100 units/day under the skin via pump. ?  ?HumaLOG 100 UNIT/ML injection ?Generic drug: insulin lispro ?Inject 1 mL (100 Units total) into the skin daily via insulin pump ?  ?meloxicam 15 MG tablet ?Commonly known as: MOBIC ?Take 1 tablet (15 mg total) by mouth daily. ?What changed:  ?when to take this ?reasons to take this ?  ?metoprolol tartrate 25 MG tablet ?Commonly known as: LOPRESSOR ?Take 2 tablets (50 mg total) by mouth 2 (two) times daily. ?What changed:  ?medication strength ?how much to take ?  ?metroNIDAZOLE 0.75 % gel ?Commonly known as: METROGEL ?Apply to face twice daily as maintenance ?  ?Omnipod 5 G6 Intro (Gen 5) Kit ?Use as directed ?  ?Omnipod 5 G6 Pod (Gen 5) Misc ?Use as directed every 2 days ?  ?ondansetron 8 MG tablet ?Commonly known as: ZOFRAN ?Take 1 tablet (8 mg total) by mouth every 8 (eight) hours as needed. ?What changed: reasons to take this ?  ?rosuvastatin 10 MG tablet ?Commonly known as: CRESTOR ?Take 1 tablet (10 mg total) by mouth daily. ?  ?Tyler Aas FlexTouch 100 UNIT/ML FlexTouch Pen ?Generic drug: insulin degludec ?Inject 15 Units into the skin daily. ?  ?Vascepa 1 g capsule ?Generic drug: icosapent Ethyl ?Take 2 capsules (2 g total) by mouth 2 (two) times daily. ?  ? ?  ? ? ?Discharge Exam: ?Filed Weights  ? 12/26/21 1014  ?Weight: 103.1 kg  ? ?General-appears in no acute distress ?Heart-S1-S2, regular, no murmur auscultated ?Lungs-clear to auscultation bilaterally, no wheezing or crackles auscultated ?Abdomen-soft, nontender, no organomegaly ?Extremities-no edema in the lower extremities ?Neuro-alert, oriented x3, no focal deficit noted ? ?Condition at discharge: good ? ?The  results of significant diagnostics from this hospitalization (including imaging, microbiology, ancillary and laboratory) are listed below for reference.  ? ?Imaging Studies: ?DG Chest Port 1 View ? ?Result Date: 12/26/2021 ?CLINICAL DATA:  Burning with urination and chills. Concern for sepsis. EXAM: PORTABLE CHEST 1 VIEW COMPARISON:  7/28//22 FINDINGS: Heart size and mediastinal contours are unremarkable. No pleural effusion or edema. Unchanged calcified nodule within the left midlung compared with 04/30/2012 compatible with granuloma. Visualized osseous structures are unremarkable. IMPRESSION: No active cardiopulmonary abnormalities. Electronically Signed   By: Kerby Moors M.D.   On: 12/26/2021 10:54  ? ?DG Chest Port 1V same Day ? ?Result Date: 12/29/2021 ?CLINICAL DATA:  Cough.  Possible sepsis. EXAM: PORTABLE CHEST 1 VIEW COMPARISON:  Chest x-ray dated Dec 26, 2021. FINDINGS: The heart size and mediastinal contours are within normal limits. Both lungs are clear. The visualized skeletal structures are unremarkable. IMPRESSION: No active disease. Electronically Signed   By: Gwyndolyn Saxon  Marzella Schlein M.D.   On: 12/29/2021 08:59  ? ?CT Renal Stone Study ? ?Result Date: 12/26/2021 ?CLINICAL DATA:  Flank pain, kidney stone suspected right EXAM: CT ABDOMEN AND PELVIS WITHOUT CONTRAST TECHNIQUE: Multidetector CT imaging of the abdomen and pelvis was performed following the standard protocol without IV contrast. RADIATION DOSE REDUCTION: This exam was performed according to the departmental dose-optimization program which includes automated exposure control, adjustment of the mA and/or kV according to patient size and/or use of iterative reconstruction technique. COMPARISON:  Renal protocol CT July 15, 2018. FINDINGS: Lower chest: No acute abnormality. Hepatobiliary: No focal liver abnormality is seen. Status post cholecystectomy. No biliary dilatation. Pancreas: Unremarkable. No pancreatic ductal dilatation or surrounding  inflammatory changes. Spleen: Normal in size without focal abnormality. Adrenals/Urinary Tract: Adrenal glands are unremarkable. Mild right caliectasis with stranding around the right kidney and right ureter. No renal or

## 2021-12-31 LAB — CULTURE, BLOOD (ROUTINE X 2)
Culture: NO GROWTH
Culture: NO GROWTH
Special Requests: ADEQUATE
Special Requests: ADEQUATE

## 2021-12-31 NOTE — Patient Outreach (Signed)
Received a hospital discharge notification for Alexis Campos . ?I have assigned [name], RN to call for follow up and determine if there are any Case Management needs.  ?  ?Iverson Alamin, CBCS, CMAA ?[facility] Care Management Assistant ?[facility] Care Management ?[phone]   ?

## 2022-01-02 ENCOUNTER — Other Ambulatory Visit: Payer: Self-pay | Admitting: *Deleted

## 2022-01-02 DIAGNOSIS — Z9641 Presence of insulin pump (external) (internal): Secondary | ICD-10-CM | POA: Diagnosis not present

## 2022-01-02 DIAGNOSIS — E7800 Pure hypercholesterolemia, unspecified: Secondary | ICD-10-CM | POA: Diagnosis not present

## 2022-01-02 DIAGNOSIS — E119 Type 2 diabetes mellitus without complications: Secondary | ICD-10-CM | POA: Diagnosis not present

## 2022-01-02 DIAGNOSIS — I10 Essential (primary) hypertension: Secondary | ICD-10-CM | POA: Diagnosis not present

## 2022-01-02 NOTE — Patient Outreach (Signed)
[facility]) Care Management  01/02/2022  Alexis Campos September 08, 1965 [id number]   Transition of care call-Re: call back Referral received: 12/31/2021 Initial outreach: 01/02/2022 Insurance: [place] Focus Plan/ [facility] Health Plan  RN briefly spoke with pt however unable to talk and requested a call back on Monday morning.   Will follow up once again on Monday at requested.  [name], RN Care Management Coordinator [facility] Main Office [phone]

## 2022-01-05 ENCOUNTER — Other Ambulatory Visit: Payer: Self-pay | Admitting: *Deleted

## 2022-01-05 NOTE — Patient Outreach (Signed)
[facility]) Care Management  01/05/2022  ANANYA MCCLEESE [birth year] [id number]  Transition of care telephone call Referral received: 5/17 2nd outreach: 5/22 Insurance: [place] Focus Plan/[facility] Health Plan  Second telephone call was unsuccessful to patient's listed telephone number, no answer received and unable to leave a HIPAA approved voice message.   Will send outreach letter due to this second attempt and requested call back.  [name], RN Care Management Coordinator [facility] Main Office [phone]

## 2022-01-06 ENCOUNTER — Other Ambulatory Visit: Payer: 59 | Admitting: *Deleted

## 2022-01-06 DIAGNOSIS — E119 Type 2 diabetes mellitus without complications: Secondary | ICD-10-CM | POA: Diagnosis not present

## 2022-01-06 DIAGNOSIS — R3914 Feeling of incomplete bladder emptying: Secondary | ICD-10-CM | POA: Diagnosis not present

## 2022-01-06 DIAGNOSIS — N10 Acute tubulo-interstitial nephritis: Secondary | ICD-10-CM | POA: Diagnosis not present

## 2022-01-06 DIAGNOSIS — N200 Calculus of kidney: Secondary | ICD-10-CM | POA: Diagnosis not present

## 2022-01-06 DIAGNOSIS — R072 Precordial pain: Secondary | ICD-10-CM

## 2022-01-06 DIAGNOSIS — N130 Hydronephrosis with ureteropelvic junction obstruction: Secondary | ICD-10-CM | POA: Diagnosis not present

## 2022-01-06 DIAGNOSIS — E785 Hyperlipidemia, unspecified: Secondary | ICD-10-CM

## 2022-01-06 LAB — LIPID PANEL
Chol/HDL Ratio: 7.4 ratio — ABNORMAL HIGH (ref 0.0–4.4)
Cholesterol, Total: 250 mg/dL — ABNORMAL HIGH (ref 100–199)
HDL: 34 mg/dL — ABNORMAL LOW (ref >39)
LDL Chol Calc (NIH): 142 mg/dL — ABNORMAL HIGH (ref 0–99)
Triglycerides: 399 mg/dL — ABNORMAL HIGH (ref 0–149)
VLDL Cholesterol Cal: 74 mg/dL — ABNORMAL HIGH (ref 5–40)

## 2022-01-06 LAB — ALT: ALT: 16 IU/L (ref 0–32)

## 2022-01-08 ENCOUNTER — Other Ambulatory Visit: Payer: Self-pay | Admitting: *Deleted

## 2022-01-08 DIAGNOSIS — Z09 Encounter for follow-up examination after completed treatment for conditions other than malignant neoplasm: Secondary | ICD-10-CM | POA: Diagnosis not present

## 2022-01-08 DIAGNOSIS — E1165 Type 2 diabetes mellitus with hyperglycemia: Secondary | ICD-10-CM | POA: Diagnosis not present

## 2022-01-08 DIAGNOSIS — R531 Weakness: Secondary | ICD-10-CM | POA: Diagnosis not present

## 2022-01-08 DIAGNOSIS — N12 Tubulo-interstitial nephritis, not specified as acute or chronic: Secondary | ICD-10-CM | POA: Diagnosis not present

## 2022-01-08 DIAGNOSIS — Z9641 Presence of insulin pump (external) (internal): Secondary | ICD-10-CM | POA: Diagnosis not present

## 2022-01-08 DIAGNOSIS — Z0289 Encounter for other administrative examinations: Secondary | ICD-10-CM | POA: Diagnosis not present

## 2022-01-08 NOTE — Patient Outreach (Signed)
[name]) Care Management  01/08/2022  Alexis Campos July 24, 1966 [id number]   Transition of care telephone call Referral received: 5/17 3rd outreach: 5/25 Insurance: [place]   Third telephone call was unsuccessful to patient's listed telephone number, no answer, HIPAA approved voice message left.  Will provide update to assigned care manager to make 4th outreach within the next 3 weeks.  Valente David, RN, MSN, [place] Manager [phone]

## 2022-01-09 ENCOUNTER — Telehealth: Payer: Self-pay | Admitting: Cardiovascular Disease

## 2022-01-09 NOTE — Telephone Encounter (Signed)
Follow Up:     Returning Alexis Campos's call from yesterday, concerning her lab results.

## 2022-01-09 NOTE — Telephone Encounter (Signed)
See Result note for Labs

## 2022-01-13 ENCOUNTER — Encounter: Payer: Self-pay | Admitting: Cardiovascular Disease

## 2022-01-13 ENCOUNTER — Ambulatory Visit: Payer: 59 | Admitting: Cardiovascular Disease

## 2022-01-13 ENCOUNTER — Other Ambulatory Visit (HOSPITAL_COMMUNITY): Payer: Self-pay

## 2022-01-13 ENCOUNTER — Ambulatory Visit: Payer: 59 | Admitting: *Deleted

## 2022-01-13 VITALS — BP 122/70 | HR 91 | Ht 70.0 in | Wt 208.2 lb

## 2022-01-13 DIAGNOSIS — E7800 Pure hypercholesterolemia, unspecified: Secondary | ICD-10-CM

## 2022-01-13 DIAGNOSIS — R079 Chest pain, unspecified: Secondary | ICD-10-CM

## 2022-01-13 DIAGNOSIS — E1110 Type 2 diabetes mellitus with ketoacidosis without coma: Secondary | ICD-10-CM | POA: Diagnosis not present

## 2022-01-13 NOTE — Patient Instructions (Signed)
Medication Instructions:  Your physician recommends that you continue on your current medications as directed. Please refer to the Current Medication list given to you today.  *If you need a refill on your cardiac medications before your next appointment, please call your pharmacy*   Lab Work: NONE If you have labs (blood work) drawn today and your tests are completely normal, you will receive your results only by: MyChart Message (if you have MyChart) OR A paper copy in the mail If you have any lab test that is abnormal or we need to change your treatment, we will call you to review the results.   Testing/Procedures: NONE   Follow-Up: At [facility], you and your health needs are our priority.  As part of our continuing mission to provide you with exceptional heart care, we have created designated Provider Care Teams.  These Care Teams include your primary Cardiologist (physician) and Advanced Practice Providers (APPs -  Physician Assistants and Nurse Practitioners) who all work together to provide you with the care you need, when you need it.  We recommend signing up for the patient portal called "MyChart".  Sign up information is provided on this After Visit Summary.  MyChart is used to connect with patients for Virtual Visits (Telemedicine).  Patients are able to view lab/test results, encounter notes, upcoming appointments, etc.  Non-urgent messages can be sent to your provider as well.   To learn more about what you can do with MyChart, go to [email or web address].    Your next appointment:   6 month(s)  The format for your next appointment:   In Person  Provider:   [place], Lissa Hoard, or [employer] {  Important Information About Sugar

## 2022-01-13 NOTE — Progress Notes (Signed)
Cardiology Office Note:    Date:  01/13/2022   ID:  Alexis Campos, DOB November 24, 1965, MRN [id number]  PCP:  [facility]   [facility] Providers Cardiologist:  Eugenia Pancoast to update primary MD,subspecialty MD or APP then REFRESH:1}    Referring MD: [facility] *   Chief Complaint  Patient presents with   Chest Pain    June 22 ,2022   Alexis Campos is a 56 y.o. female with a hx of HTN, HLD. 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 Does the best she can while working .  Is not exercising regularly .   Has occasional left upper arm pain Lasts for a few minutes  Does not seem to be related to exertion .  Very strong family hx of CAD Mother, and 2 brothers  had cad in their 39s-50s.  Mother had DM,  brothers did not have DM , Brothers smoked cigarettes.    Uses the OmniPod for glucose control.  Dexicom  She has occasional episodes of hear flutters, Might last for a few minutes.  No lightheadedness,  Suggested  Lourena Simmonds  She had a heart catheterization in June, 2001 by Dr. Lavonne Chick.  She had normal coronary arteries at that time.  She also recalls having a stress test several years later.  She had an episode of SVT during the stress test and she received adenosine for treatment for that. She has not had any recurrent episodes of SVT.  She has been taking metoprolol which seems to keep that under good control.   Oct. 3, 2022 Alexis Campos is seen for follow up of an episode of chest pain several months ago. Coronary CT angio revealed : Coronary calcium score of 59.8. This was 92nd percentile for age and sex matched control.  LM: normal LAD :  mild calcified plaque LCx: normal RCA :  mild stenosis  We discussed lipid control and diabetic  control.  Lipids  She is on crestor, fenofibrate, and vasepa   She is starting a very intense diet, exercise, weight loss program with a Systems analyst.  She is looking forward to seeing how her lipids improve over the next 3 months.  We will plan on checking her lipids again in 3 months from now.   Developed Covid in July. Taste is still not normal .  Was out of work for 6 weeks.  Had lots of hyperglycemia   Jan 13, 2022: Alexis Campos is seen today for follow up ofher CP, HLD ,  HTN. Coronary calcium score of 59.8. This was 92nd percentile for age Coronary CT angiogram from July, 2022  RCA: Mild irregularities Left main: Normal LAD: Minor proximal irregularities LAD: Small and nondominant with no plaque.  Was in the hospital for seveal days with pyelonephritis ( possibly from a renal stone )  Still recovering from that . Glucose has not been all that well controlled. .   She still not feeling very well.  She still has a poor appetite and her strength is very poor.  I think she still has lots of improvement to go a full week get her back on her typical dose of metoprolol 150 mg twice daily.  Past Medical History:  Diagnosis Date  Anemia    Anemia 05/22/2014   Blood transfusion    Diabetes mellitus    diagnosed 1996-insulin and metformin   Endometriosis    Fibromyalgia    GERD (gastroesophageal reflux disease)    History of blood transfusion 12/09   [facility]   Hyperlipidemia    Hypertension    Neuromuscular disorder ([place])    fibromyalgia   Seasonal allergies    recent bronchitis-chest xray/ct scan-granuloma noted present since 2008   Sinusitis    Splenomegaly 05/22/2014    Past Surgical History:  Procedure Laterality Date   ABDOMINAL SURGERY     LOA for endomeriosis   APPENDECTOMY  05/1995   CESAREAN SECTION     CHOLECYSTECTOMY  01/1995   COLONOSCOPY     lysis of adhesions  1996/ 2002   TONSILLECTOMY  09/1994    Current Medications: Current Meds  Medication Sig    Continuous Blood Gluc Receiver (DEXCOM G6 RECEIVER) DEVI Use as directed.   Continuous Blood Gluc Sensor (DEXCOM G6 SENSOR) MISC Use as directed every 10 days   Continuous Blood Gluc Transmit (DEXCOM G6 TRANSMITTER) MISC Use as directed   diphenhydramine-acetaminophen (TYLENOL PM) 25-500 MG TABS tablet Take 2 tablets by mouth at bedtime as needed (sleep).   escitalopram (LEXAPRO) 20 MG tablet TAKE 1 TABLET BY MOUTH ONCE DAILY   fenofibrate (TRICOR) 145 MG tablet TAKE 1 TABLET BY MOUTH ONCE DAILY   icosapent Ethyl (VASCEPA) 1 g capsule Take 2 capsules (2 g total) by mouth 2 (two) times daily.   insulin degludec (TRESIBA FLEXTOUCH) 100 UNIT/ML FlexTouch Pen Inject 15 Units into the skin daily.   Insulin Disposable Pump (OMNIPOD 5 G6 POD, GEN 5,) MISC Use as directed every 2 days   insulin lispro (HUMALOG) 100 UNIT/ML injection Inject 1 mL (100 Units total) into the skin daily via insulin pump   metoprolol tartrate (LOPRESSOR) 50 MG tablet Take 100 mg by mouth 2 (two) times daily.   metroNIDAZOLE (METROGEL) 0.75 % gel Apply to face twice daily as maintenance   rosuvastatin (CRESTOR) 10 MG tablet Take 1 tablet (10 mg total) by mouth daily.     Allergies:   Codeine, Duloxetine hcl, Lisinopril, and Metoclopramide hcl   Social History   Socioeconomic History   Marital status: Single    Spouse name: Not on file   Number of children: Not on file   Years of education: Not on file   Highest education level: Not on file  Occupational History   Not on file  Tobacco Use   Smoking status: Never   Smokeless tobacco: Never  Vaping Use   Vaping Use: Never used  Substance and Sexual Activity   Alcohol use: No   Drug use: No   Sexual activity: Not on file  Other Topics Concern   Not on file  Social History Narrative   Not on file   Social Determinants of Health   Financial Resource Strain: Not on file  Food Insecurity: Not on file  Transportation Needs: Not on file  Physical Activity:  Not on file  Stress: Not on file  Social Connections: Not on file     Family History: The patient's family history includes Anemia in her mother; CAD in her brother and father; Coronary artery disease in her mother.  ROS:   Please see the history of present illness.     All other systems reviewed and are negative.  EKGs/Labs/Other Studies Reviewed:    The following studies were reviewed today:  Recent Labs: 12/28/2021: B Natriuretic Peptide 219.4; Hemoglobin 9.8; Magnesium 2.0; Platelets 100 12/29/2021: BUN 26; Creatinine, Ser 1.06; Potassium 3.2; Sodium 134 01/06/2022: ALT 16  Recent Lipid Panel    Component Value Date/Time   CHOL 250 (H) 01/06/2022 0923   TRIG 399 (H) 01/06/2022 0923   HDL 34 (L) 01/06/2022 0923   CHOLHDL 7.4 (H) 01/06/2022 0923   LDLCALC 142 (H) 01/06/2022 0923     Risk Assessment/Calculations:           Physical Exam:     Physical Exam: Blood pressure 122/70, pulse 91, height 5\' 10"  (1.778 m), weight 208 lb 3.2 oz (94.4 kg), last menstrual period 06/14/2018, SpO2 97 %.  GEN:  Well nourished, well developed in no acute distress HEENT: Normal NECK: No JVD; No carotid bruits LYMPHATICS: No lymphadenopathy CARDIAC: RRR  RESPIRATORY:  Clear to auscultation without rales, wheezing or rhonchi  ABDOMEN: Soft, non-tender, non-distended MUSCULOSKELETAL:  No edema; No deformity  SKIN: Warm and dry NEUROLOGIC:  Alert and oriented x 3     ECG:    ASSESSMENT:    1. Chest pain of uncertain etiology   2. Type 2 diabetes mellitus with ketoacidosis without coma, unspecified whether long term insulin use ([place])   3. Pure hypercholesterolemia      PLAN:      Chest pain: She denies having any recent episodes of chest discomfort.  2.  Hyperlipidemia: Lipids have been a little elevated.  They were poorly controlled during her hospitalization.  We will continue current medications.  Consider checking them later in the year.  I will see her again in  6 months for follow-up visit.       Medication Adjustments/Labs and Tests Ordered: Current medicines are reviewed at length with the patient today.  Concerns regarding medicines are outlined above.  No orders of the defined types were placed in this encounter.   No orders of the defined types were placed in this encounter.    Patient Instructions  Medication Instructions:  Your physician recommends that you continue on your current medications as directed. Please refer to the Current Medication list given to you today.  *If you need a refill on your cardiac medications before your next appointment, please call your pharmacy*   Lab Work: NONE If you have labs (blood work) drawn today and your tests are completely normal, you will receive your results only by: [place] (if you have MyChart) OR A paper copy in the mail If you have any lab test that is abnormal or we need to change your treatment, we will call you to review the results.   Testing/Procedures: NONE   Follow-Up: At [facility], you and your health needs are our priority.  As part of our continuing mission to provide you with exceptional heart care, we have created designated Provider Care Teams.  These Care Teams include your primary Cardiologist (physician) and Advanced Practice Providers (APPs -  Physician Assistants and Nurse Practitioners) who all work together to provide you with the care you need, when you need it.  We recommend signing up for the patient portal called "MyChart".  Sign up information is provided on this After Visit Summary.  MyChart is used to connect with patients for Virtual Visits (Telemedicine).  Patients are able to view lab/test results, encounter notes, upcoming appointments, etc.  Non-urgent messages can be sent to your provider as well.   To learn more about what you can do with MyChart, go to [email or web address].  Your next appointment:   6 month(s)  The format for  your next appointment:   In Person  Provider:   Jacklynn Lewis, or [employer] {  Important Information About Sugar         Signed, Mertie Moores, MD  01/13/2022 5:46 PM    [place]

## 2022-01-14 ENCOUNTER — Encounter: Payer: Self-pay | Admitting: *Deleted

## 2022-01-14 NOTE — Telephone Encounter (Signed)
This encounter was created in error - please disregard.

## 2022-01-21 ENCOUNTER — Other Ambulatory Visit (HOSPITAL_COMMUNITY): Payer: Self-pay

## 2022-01-21 DIAGNOSIS — E1165 Type 2 diabetes mellitus with hyperglycemia: Secondary | ICD-10-CM | POA: Diagnosis not present

## 2022-01-21 DIAGNOSIS — Z9641 Presence of insulin pump (external) (internal): Secondary | ICD-10-CM | POA: Diagnosis not present

## 2022-01-21 DIAGNOSIS — R531 Weakness: Secondary | ICD-10-CM | POA: Diagnosis not present

## 2022-01-21 DIAGNOSIS — N12 Tubulo-interstitial nephritis, not specified as acute or chronic: Secondary | ICD-10-CM | POA: Diagnosis not present

## 2022-01-21 DIAGNOSIS — Z09 Encounter for follow-up examination after completed treatment for conditions other than malignant neoplasm: Secondary | ICD-10-CM | POA: Diagnosis not present

## 2022-01-21 DIAGNOSIS — Z0289 Encounter for other administrative examinations: Secondary | ICD-10-CM | POA: Diagnosis not present

## 2022-01-21 MED ORDER — TRESIBA FLEXTOUCH 100 UNIT/ML ~~LOC~~ SOPN
65.0000 [IU] | PEN_INJECTOR | Freq: Every day | SUBCUTANEOUS | 1 refills | Status: DC
  Filled 2022-01-21: qty 6, 9d supply, fill #0
  Filled 2022-06-04: qty 3, 4d supply, fill #0

## 2022-01-22 ENCOUNTER — Other Ambulatory Visit (HOSPITAL_COMMUNITY): Payer: Self-pay

## 2022-01-26 ENCOUNTER — Other Ambulatory Visit (HOSPITAL_COMMUNITY): Payer: Self-pay

## 2022-01-26 MED ORDER — TRESIBA FLEXTOUCH 100 UNIT/ML ~~LOC~~ SOPN
65.0000 [IU] | PEN_INJECTOR | Freq: Every day | SUBCUTANEOUS | 6 refills | Status: DC
  Filled 2022-01-26: qty 39, 60d supply, fill #0

## 2022-01-29 ENCOUNTER — Other Ambulatory Visit (HOSPITAL_COMMUNITY): Payer: Self-pay

## 2022-02-02 ENCOUNTER — Other Ambulatory Visit: Payer: Self-pay | Admitting: *Deleted

## 2022-02-02 NOTE — Patient Outreach (Signed)
[facility]) Care Management  02/02/2022  Alexis Campos 08/11/1966 [id number]  Transition of care telephone call Outreach: #4  Insurance: [place] Focus Plan/[facility] [employer]  Outreach #4 call was unsuccessful to patient's listed telephone number, no answer received. RN left a HIPAA compliant voicemail message with contact information, requesting a return call.   Based upon the numerous unsuccessful outreach calls will close this case and notify the provider and pt of case closure.  [name], RN Care Management Coordinator [facility] Main Office [phone]

## 2022-03-16 ENCOUNTER — Other Ambulatory Visit (HOSPITAL_COMMUNITY): Payer: Self-pay

## 2022-03-16 MED ORDER — DEXCOM G6 RECEIVER DEVI
1.0000 | 0 refills | Status: DC
  Filled 2022-03-16: qty 1, 90d supply, fill #0

## 2022-03-16 MED ORDER — DEXCOM G6 TRANSMITTER MISC
1.0000 | 4 refills | Status: DC
  Filled 2022-03-16 – 2022-04-22 (×2): qty 1, 90d supply, fill #0
  Filled 2022-07-31: qty 1, 90d supply, fill #1

## 2022-03-17 ENCOUNTER — Other Ambulatory Visit (HOSPITAL_COMMUNITY): Payer: Self-pay

## 2022-03-25 ENCOUNTER — Other Ambulatory Visit (HOSPITAL_COMMUNITY): Payer: Self-pay

## 2022-03-25 MED ORDER — INSULIN GLARGINE 100 UNIT/ML ~~LOC~~ SOLN
65.0000 [IU] | Freq: Every day | SUBCUTANEOUS | 5 refills | Status: DC
  Filled 2022-03-25: qty 10, 15d supply, fill #0

## 2022-03-25 MED ORDER — INSULIN GLARGINE-YFGN 100 UNIT/ML ~~LOC~~ SOPN
65.0000 [IU] | PEN_INJECTOR | Freq: Every day | SUBCUTANEOUS | 6 refills | Status: DC
  Filled 2022-03-25: qty 30, 46d supply, fill #0
  Filled 2022-05-08: qty 30, 46d supply, fill #1
  Filled 2022-07-07: qty 30, 46d supply, fill #2
  Filled 2022-09-24: qty 30, 46d supply, fill #3
  Filled 2022-10-28 – 2022-11-23 (×2): qty 30, 46d supply, fill #4

## 2022-03-26 ENCOUNTER — Other Ambulatory Visit (HOSPITAL_COMMUNITY): Payer: Self-pay

## 2022-04-07 ENCOUNTER — Other Ambulatory Visit (HOSPITAL_COMMUNITY): Payer: Self-pay

## 2022-04-07 DIAGNOSIS — Z9641 Presence of insulin pump (external) (internal): Secondary | ICD-10-CM | POA: Diagnosis not present

## 2022-04-07 DIAGNOSIS — E538 Deficiency of other specified B group vitamins: Secondary | ICD-10-CM | POA: Diagnosis not present

## 2022-04-07 DIAGNOSIS — E119 Type 2 diabetes mellitus without complications: Secondary | ICD-10-CM | POA: Diagnosis not present

## 2022-04-07 DIAGNOSIS — E7800 Pure hypercholesterolemia, unspecified: Secondary | ICD-10-CM | POA: Diagnosis not present

## 2022-04-07 DIAGNOSIS — E559 Vitamin D deficiency, unspecified: Secondary | ICD-10-CM | POA: Diagnosis not present

## 2022-04-07 DIAGNOSIS — E1165 Type 2 diabetes mellitus with hyperglycemia: Secondary | ICD-10-CM | POA: Diagnosis not present

## 2022-04-07 DIAGNOSIS — E038 Other specified hypothyroidism: Secondary | ICD-10-CM | POA: Diagnosis not present

## 2022-04-07 DIAGNOSIS — I10 Essential (primary) hypertension: Secondary | ICD-10-CM | POA: Diagnosis not present

## 2022-04-07 MED ORDER — MOUNJARO 5 MG/0.5ML ~~LOC~~ SOAJ
5.0000 mg | SUBCUTANEOUS | 6 refills | Status: DC
  Filled 2022-04-07: qty 2, 28d supply, fill #0

## 2022-04-07 MED ORDER — METRONIDAZOLE 0.75 % EX GEL
1.0000 | Freq: Two times a day (BID) | CUTANEOUS | 0 refills | Status: DC
  Filled 2022-04-07: qty 45, 30d supply, fill #0

## 2022-04-07 MED ORDER — DOXYCYCLINE HYCLATE 100 MG PO CAPS
100.0000 mg | ORAL_CAPSULE | Freq: Two times a day (BID) | ORAL | 0 refills | Status: DC
  Filled 2022-04-07: qty 14, 7d supply, fill #0

## 2022-04-22 ENCOUNTER — Other Ambulatory Visit (HOSPITAL_COMMUNITY): Payer: Self-pay

## 2022-05-08 ENCOUNTER — Other Ambulatory Visit (HOSPITAL_COMMUNITY): Payer: Self-pay

## 2022-05-08 MED ORDER — MOUNJARO 5 MG/0.5ML ~~LOC~~ SOAJ
0.5000 mg | SUBCUTANEOUS | 6 refills | Status: DC
  Filled 2022-05-08: qty 2, 28d supply, fill #0

## 2022-05-08 MED ORDER — MOUNJARO 7.5 MG/0.5ML ~~LOC~~ SOAJ
7.5000 mg | SUBCUTANEOUS | 6 refills | Status: DC
  Filled 2022-05-08: qty 2, 28d supply, fill #0

## 2022-05-12 ENCOUNTER — Other Ambulatory Visit (HOSPITAL_COMMUNITY): Payer: Self-pay

## 2022-06-03 ENCOUNTER — Other Ambulatory Visit (HOSPITAL_COMMUNITY): Payer: Self-pay

## 2022-06-03 ENCOUNTER — Other Ambulatory Visit: Payer: Self-pay | Admitting: Cardiovascular Disease

## 2022-06-03 MED ORDER — ICOSAPENT ETHYL 1 G PO CAPS
2.0000 g | ORAL_CAPSULE | Freq: Two times a day (BID) | ORAL | 2 refills | Status: DC
  Filled 2022-06-03: qty 360, 90d supply, fill #0

## 2022-06-03 MED ORDER — ROSUVASTATIN CALCIUM 10 MG PO TABS
10.0000 mg | ORAL_TABLET | Freq: Every day | ORAL | 2 refills | Status: DC
  Filled 2022-06-03: qty 90, 90d supply, fill #0

## 2022-06-04 ENCOUNTER — Other Ambulatory Visit (HOSPITAL_COMMUNITY): Payer: Self-pay

## 2022-06-04 MED ORDER — MOUNJARO 7.5 MG/0.5ML ~~LOC~~ SOAJ
7.5000 mg | SUBCUTANEOUS | 6 refills | Status: DC
  Filled 2022-06-04: qty 2, 28d supply, fill #0

## 2022-06-04 MED ORDER — INSULIN GLARGINE-YFGN 100 UNIT/ML ~~LOC~~ SOPN
45.0000 [IU] | PEN_INJECTOR | Freq: Every day | SUBCUTANEOUS | 4 refills | Status: DC
  Filled 2022-06-04: qty 45, 100d supply, fill #0

## 2022-07-07 ENCOUNTER — Other Ambulatory Visit (HOSPITAL_COMMUNITY): Payer: Self-pay

## 2022-07-07 MED ORDER — METOPROLOL TARTRATE 50 MG PO TABS
150.0000 mg | ORAL_TABLET | Freq: Two times a day (BID) | ORAL | 4 refills | Status: DC
  Filled 2022-07-07: qty 540, 90d supply, fill #0

## 2022-07-08 ENCOUNTER — Other Ambulatory Visit (HOSPITAL_COMMUNITY): Payer: Self-pay

## 2022-07-08 MED ORDER — MOUNJARO 7.5 MG/0.5ML ~~LOC~~ SOAJ
7.5000 mg | SUBCUTANEOUS | 6 refills | Status: DC
  Filled 2022-07-08: qty 2, 28d supply, fill #0

## 2022-07-16 ENCOUNTER — Other Ambulatory Visit (HOSPITAL_COMMUNITY): Payer: Self-pay

## 2022-07-16 DIAGNOSIS — L719 Rosacea, unspecified: Secondary | ICD-10-CM | POA: Diagnosis not present

## 2022-07-16 DIAGNOSIS — R109 Unspecified abdominal pain: Secondary | ICD-10-CM | POA: Diagnosis not present

## 2022-07-16 DIAGNOSIS — M791 Myalgia, unspecified site: Secondary | ICD-10-CM | POA: Diagnosis not present

## 2022-07-16 MED ORDER — CIPROFLOXACIN HCL 500 MG PO TABS
500.0000 mg | ORAL_TABLET | Freq: Two times a day (BID) | ORAL | 0 refills | Status: AC
  Filled 2022-07-16: qty 14, 7d supply, fill #0

## 2022-07-16 MED ORDER — DICLOFENAC SODIUM 1 % EX GEL
Freq: Two times a day (BID) | CUTANEOUS | 1 refills | Status: DC
  Filled 2022-07-16: qty 100, 7d supply, fill #0

## 2022-07-16 MED ORDER — CLINDAMYCIN PHOSPHATE 1 % EX LOTN
TOPICAL_LOTION | Freq: Two times a day (BID) | CUTANEOUS | 1 refills | Status: DC
  Filled 2022-07-16: qty 60, 30d supply, fill #0

## 2022-07-16 MED ORDER — PREDNISONE 20 MG PO TABS
40.0000 mg | ORAL_TABLET | Freq: Every day | ORAL | 0 refills | Status: DC
  Filled 2022-07-16: qty 10, 5d supply, fill #0

## 2022-07-16 MED ORDER — CLINDAMYCIN PHOSPHATE 1 % EX SOLN
Freq: Two times a day (BID) | CUTANEOUS | 1 refills | Status: DC
  Filled 2022-07-16: qty 30, 30d supply, fill #0

## 2022-07-17 ENCOUNTER — Other Ambulatory Visit (HOSPITAL_COMMUNITY): Payer: Self-pay

## 2022-07-27 ENCOUNTER — Other Ambulatory Visit (HOSPITAL_COMMUNITY): Payer: Self-pay

## 2022-07-27 DIAGNOSIS — M545 Low back pain, unspecified: Secondary | ICD-10-CM | POA: Diagnosis not present

## 2022-07-27 DIAGNOSIS — M25551 Pain in right hip: Secondary | ICD-10-CM | POA: Diagnosis not present

## 2022-07-27 MED ORDER — CELECOXIB 200 MG PO CAPS
200.0000 mg | ORAL_CAPSULE | Freq: Every day | ORAL | 0 refills | Status: DC | PRN
  Filled 2022-07-27: qty 30, 30d supply, fill #0

## 2022-07-27 MED ORDER — METHOCARBAMOL 500 MG PO TABS
500.0000 mg | ORAL_TABLET | Freq: Four times a day (QID) | ORAL | 0 refills | Status: DC | PRN
  Filled 2022-07-27: qty 40, 10d supply, fill #0

## 2022-07-27 NOTE — Progress Notes (Signed)
Pt cancelled  This encounter was created in error - please disregard. 

## 2022-07-28 ENCOUNTER — Encounter: Payer: 59 | Admitting: Cardiovascular Disease

## 2022-07-29 ENCOUNTER — Other Ambulatory Visit (HOSPITAL_COMMUNITY): Payer: Self-pay

## 2022-07-31 ENCOUNTER — Other Ambulatory Visit (HOSPITAL_COMMUNITY): Payer: Self-pay

## 2022-08-04 ENCOUNTER — Other Ambulatory Visit (HOSPITAL_COMMUNITY): Payer: Self-pay

## 2022-08-04 DIAGNOSIS — M545 Low back pain, unspecified: Secondary | ICD-10-CM | POA: Diagnosis not present

## 2022-08-04 MED ORDER — MOUNJARO 10 MG/0.5ML ~~LOC~~ SOAJ
10.0000 mg | SUBCUTANEOUS | 5 refills | Status: DC
  Filled 2022-08-04: qty 2, 28d supply, fill #0

## 2022-08-06 ENCOUNTER — Encounter (HOSPITAL_COMMUNITY): Payer: Self-pay

## 2022-08-06 ENCOUNTER — Ambulatory Visit (HOSPITAL_COMMUNITY)
Admission: EM | Admit: 2022-08-06 | Discharge: 2022-08-06 | Disposition: A | Discharge status: Home / Self Care | Payer: 59 | Attending: Physician Assistant | Admitting: Physician Assistant

## 2022-08-06 ENCOUNTER — Other Ambulatory Visit (HOSPITAL_COMMUNITY): Payer: Self-pay

## 2022-08-06 DIAGNOSIS — J208 Acute bronchitis due to other specified organisms: Secondary | ICD-10-CM | POA: Diagnosis not present

## 2022-08-06 DIAGNOSIS — J101 Influenza due to other identified influenza virus with other respiratory manifestations: Secondary | ICD-10-CM | POA: Diagnosis not present

## 2022-08-06 DIAGNOSIS — J111 Influenza due to unidentified influenza virus with other respiratory manifestations: Secondary | ICD-10-CM

## 2022-08-06 LAB — POC INFLUENZA A AND B ANTIGEN (URGENT CARE ONLY)
INFLUENZA A ANTIGEN, POC: POSITIVE — AB
INFLUENZA B ANTIGEN, POC: NEGATIVE

## 2022-08-06 MED ORDER — OSELTAMIVIR PHOSPHATE 75 MG PO CAPS
75.0000 mg | ORAL_CAPSULE | Freq: Two times a day (BID) | ORAL | 0 refills | Status: DC
  Filled 2022-08-06: qty 10, 5d supply, fill #0

## 2022-08-06 MED ORDER — IPRATROPIUM-ALBUTEROL 0.5-2.5 (3) MG/3ML IN SOLN
RESPIRATORY_TRACT | Status: AC
  Filled 2022-08-06: qty 3

## 2022-08-06 MED ORDER — ALBUTEROL SULFATE HFA 108 (90 BASE) MCG/ACT IN AERS
1.0000 | INHALATION_SPRAY | Freq: Four times a day (QID) | RESPIRATORY_TRACT | 0 refills | Status: DC | PRN
  Filled 2022-08-06: qty 6.7, 25d supply, fill #0

## 2022-08-06 MED ORDER — IPRATROPIUM-ALBUTEROL 0.5-2.5 (3) MG/3ML IN SOLN
3.0000 mL | Freq: Once | RESPIRATORY_TRACT | Status: AC
  Administered 2022-08-06: 3 mL via RESPIRATORY_TRACT

## 2022-08-06 MED ORDER — PROMETHAZINE-DM 6.25-15 MG/5ML PO SYRP
5.0000 mL | ORAL_SOLUTION | Freq: Two times a day (BID) | ORAL | 0 refills | Status: DC | PRN
  Filled 2022-08-06: qty 118, 12d supply, fill #0

## 2022-08-06 NOTE — ED Triage Notes (Signed)
Pt has been exposed to flu, covid and RSV. Pt has a cough, nasal congestion, runny nose, headache sore throat , fever, right ear pain , body aches, chills , loss of appetite x 4days pt had ibuprofen at 5am today

## 2022-08-06 NOTE — Discharge Instructions (Signed)
You tested positive for influenza A.  Please start Tamiflu twice daily for 5 days.  Use albuterol inhaler every 4-6 hours as needed for shortness of breath and coughing fits.  Use Promethazine DM for cough.  This will make you sleepy so do not drive or drink alcohol with taking it.  You can use over-the-counter medication including plain guaifenesin, Flonase, Tylenol, ibuprofen.  Make sure you rest and drink plenty of fluid.  If your symptoms are improving within a week please return for reevaluation.  If anything worsens and you have shortness of breath, chest pain, fever not respond to medication, weakness you need to be seen immediately.

## 2022-08-06 NOTE — ED Provider Notes (Signed)
[place]    CSN: [id number] Arrival date & time: 08/06/22  5638      History   Chief Complaint Chief Complaint  Patient presents with   Cough   Headache   Fever   Otalgia    HPI FREDDYE CARDAMONE is a 56 y.o. female.   Patient presents today with a 3 to 4-day history of URI symptoms including cough, congestion, rhinorrhea, fatigue, malaise, fever.  She denies chest pain, nausea, vomiting, weakness.  She has tried DayQuil, Tylenol, ibuprofen without improvement of symptoms.  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, RSV.  She is up-to-date on her flu and COVID vaccines.  She has had COVID in the past with last episode approximately 1 year ago.  Denies history of asthma, COPD.  She does not smoke.  Denies any recent antibiotics or steroids.  She does have seasonal allergies managed with over-the-counter medication as needed.  She is having difficulty with daily activities as result of symptoms.    Past Medical History:  Diagnosis Date   Anemia    Anemia 05/22/2014   Blood transfusion    Diabetes mellitus    diagnosed 1996-insulin and metformin   Endometriosis    Fibromyalgia    GERD (gastroesophageal reflux disease)    History of blood transfusion 12/09   [facility]   Hyperlipidemia    Hypertension    Neuromuscular disorder ([place])    fibromyalgia   Seasonal allergies    recent bronchitis-chest xray/ct scan-granuloma noted present since 2008   Sinusitis    Splenomegaly 05/22/2014    Patient Active Problem List   Diagnosis Date Noted   Diabetes mellitus type 2 with ketoacidosis ([place]) 12/27/2021   Pyelonephritis    Sepsis ([place]) 12/26/2021   Splenomegaly 05/22/2014   Anemia 05/22/2014   Obesity (BMI 30-39.9) 02/26/2014   Diabetes ([place]) 11/15/2012   Abdominal pain, right side. 10/23/2011    Past Surgical History:  Procedure Laterality Date   ABDOMINAL SURGERY     LOA for endomeriosis   APPENDECTOMY  05/1995   CESAREAN SECTION      CHOLECYSTECTOMY  01/1995   COLONOSCOPY     lysis of adhesions  1996/ 2002   TONSILLECTOMY  09/1994    OB History     Gravida  1   Para  1   Term  0   Preterm  1   AB  0   Living  1      SAB  0   IAB  0   Ectopic  0   Multiple  0   Live Births               Home Medications    Prior to Admission medications   Medication Sig Start Date End Date Taking? Authorizing Provider  oseltamivir (TAMIFLU) 75 MG capsule Take 1 capsule (75 mg total) by mouth every 12 (twelve) hours. 08/06/22  Yes Stone Spirito K, PA-C  promethazine-dextromethorphan (PROMETHAZINE-DM) 6.25-15 MG/5ML syrup Take 5 mLs by mouth 2 (two) times daily as needed for cough. 08/06/22  Yes Haroldine Redler K, PA-C  albuterol (VENTOLIN HFA) 108 (90 Base) MCG/ACT inhaler Inhale 1-2 puffs into the lungs every 6 (six) hours as needed (cough). 08/06/22   Zanyah Lentsch, Derry Skill, PA-C  celecoxib (CELEBREX) 200 MG capsule Take 1 capsule (200 mg total) by mouth daily as needed for pain 07/27/22     clindamycin (CLEOCIN T) 1 % external solution Apply 1 application topically 2 (  two) times daily for 7 days 07/16/22     clindamycin (CLEOCIN T) 1 % lotion Apply topically 2 (two) times daily. 07/16/22     Continuous Blood Gluc Receiver (DEXCOM G6 RECEIVER) DEVI Use as directed. 01/21/21     Continuous Blood Gluc Receiver (DEXCOM G6 RECEIVER) DEVI Use as directed. 03/16/22     Continuous Blood Gluc Sensor (DEXCOM G6 SENSOR) MISC Use as directed every 10 days 09/24/21     Continuous Blood Gluc Transmit (DEXCOM G6 TRANSMITTER) MISC Use every 3 months 03/16/22   Jacelyn Pi, MD  diclofenac Sodium (VOLTAREN) 1 % GEL Apply topically 2 (two) times daily for 7 days as directed 07/16/22     diphenhydramine-acetaminophen (TYLENOL PM) 25-500 MG TABS tablet Take 2 tablets by mouth at bedtime as needed (sleep).    [provider]  doxycycline (VIBRAMYCIN) 100 MG capsule Take 1 capsule (100 mg total) by mouth 2 (two) times daily for 7 days.  04/07/22     escitalopram (LEXAPRO) 20 MG tablet TAKE 1 TABLET BY MOUTH ONCE DAILY 11/12/20   Jacelyn Pi, MD  fenofibrate (TRICOR) 145 MG tablet TAKE 1 TABLET BY MOUTH ONCE DAILY 11/12/20   Jacelyn Pi, MD  icosapent Ethyl (VASCEPA) 1 g capsule Take 2 capsules (2 g total) by mouth 2 (two) times daily. 06/03/22   Nahser, Wonda Cheng, MD  insulin degludec (TRESIBA FLEXTOUCH) 100 UNIT/ML FlexTouch Pen Inject 15 Units into the skin daily. 03/31/21     insulin degludec (TRESIBA FLEXTOUCH) 100 UNIT/ML FlexTouch Pen Inject 65 Units into the skin daily. 01/21/22     insulin degludec (TRESIBA FLEXTOUCH) 100 UNIT/ML FlexTouch Pen Inject 65 Units into the skin daily. 01/26/22     Insulin Disposable Pump (OMNIPOD 5 G6 INTRO, GEN 5,) KIT Use as directed Patient not taking: Reported on 01/13/2022 02/27/21     Insulin Disposable Pump (OMNIPOD 5 G6 POD, GEN 5,) MISC Use as directed every 2 days 02/27/21     insulin glargine (SEMGLEE) 100 UNIT/ML injection Inject 65 units into the skin daily. 03/25/22     insulin glargine-yfgn (SEMGLEE, YFGN,) 100 UNIT/ML Pen Inject 65 Units into the skin daily. 03/25/22     insulin glargine-yfgn (SEMGLEE, YFGN,) 100 UNIT/ML Pen Inject 45 Units into the skin daily. 06/03/22     insulin lispro (HUMALOG) 100 UNIT/ML injection Inject up to 100 units/day under the skin via pump. Patient not taking: Reported on 01/13/2022 05/01/20   Jacelyn Pi, MD  insulin lispro (HUMALOG) 100 UNIT/ML injection Inject 1 mL (100 Units total) into the skin daily via insulin pump 09/18/21     meloxicam (MOBIC) 15 MG tablet Take 1 tablet (15 mg total) by mouth daily. Patient not taking: Reported on 01/13/2022 09/16/21   Edrick Kins, DPM  methocarbamol (ROBAXIN) 500 MG tablet Take 1 tablet (500 mg total) by mouth every 6-8 hours as needed for apasm 07/27/22     metoprolol tartrate (LOPRESSOR) 25 MG tablet Take 2 tablets (50 mg total) by mouth 2 (two) times daily. Patient not taking: Reported on 01/13/2022 12/29/21    Oswald Hillock, MD  metoprolol tartrate (LOPRESSOR) 50 MG tablet Take 100 mg by mouth 2 (two) times daily.    [provider]  metoprolol tartrate (LOPRESSOR) 50 MG tablet Take 3 tablets (150 mg total) by mouth 2 (two) times daily. 07/07/22     metroNIDAZOLE (METROGEL) 0.75 % gel Apply to face twice daily as maintenance 09/15/21     metroNIDAZOLE (METROGEL) 0.75 %  gel Apply 1 Application topically 2 (two) times daily for 30 days. 04/07/22     ondansetron (ZOFRAN) 8 MG tablet Take 1 tablet (8 mg total) by mouth every 8 (eight) hours as needed. Patient not taking: Reported on 01/13/2022 12/25/21     predniSONE (DELTASONE) 20 MG tablet Take 2 tablets (40 mg total) by mouth daily for 5 days. 07/16/22     rosuvastatin (CRESTOR) 10 MG tablet Take 1 tablet (10 mg total) by mouth daily. 06/03/22   Nahser, Wonda Cheng, MD  tirzepatide [facility]) 10 MG/0.5ML Pen Inject 10 mg into the skin once a week. 08/04/22     tirzepatide (MOUNJARO) 5 MG/0.5ML Pen Inject 5 mg into the skin once a week. 04/07/22     tirzepatide (MOUNJARO) 7.5 MG/0.5ML Pen Inject 7.5 mg into the skin once a week. 06/03/22     tirzepatide (MOUNJARO) 7.5 MG/0.5ML Pen Inject 7.5 mg into the skin once a week. 07/07/22       Family History Family History  Problem Relation Age of Onset   Anemia Mother    Coronary artery disease Mother    CAD Father    CAD Brother     Social History Social History   Tobacco Use   Smoking status: Never   Smokeless tobacco: Never  Vaping Use   Vaping Use: Never used  Substance Use Topics   Alcohol use: No   Drug use: No     Allergies   Codeine, Duloxetine hcl, Lisinopril, and Metoclopramide hcl   Review of Systems Review of Systems  Constitutional:  Positive for activity change, appetite change, chills, fatigue and fever.  HENT:  Positive for congestion, ear pain, rhinorrhea and sore throat. Negative for sinus pressure and sneezing.   Respiratory:  Positive for cough and shortness of  breath.   Cardiovascular:  Negative for chest pain.  Gastrointestinal:  Negative for abdominal pain, diarrhea, nausea and vomiting.  Musculoskeletal:  Positive for arthralgias and myalgias.  Neurological:  Positive for headaches. Negative for dizziness and light-headedness.     Physical Exam Triage Vital Signs ED Triage Vitals  Enc Vitals Group     BP 08/06/22 0831 111/78     Pulse Rate 08/06/22 0831 85     Resp 08/06/22 0831 12     Temp 08/06/22 0831 98.5 F (36.9 C)     Temp Source 08/06/22 0831 Oral     SpO2 08/06/22 0831 97 %     Weight --      Height --      Head Circumference --      Peak Flow --      Pain Score 08/06/22 0829 5     Pain Loc --      Pain Edu? --      Excl. in [place]? --    No data found.  Updated Vital Signs BP 111/78 (BP Location: Left Arm)   Pulse 85   Temp 98.5 F (36.9 C) (Oral)   Resp 12   LMP 06/14/2018 (Approximate)   SpO2 97%   Visual Acuity Right Eye Distance:   Left Eye Distance:   Bilateral Distance:    Right Eye Near:   Left Eye Near:    Bilateral Near:     Physical Exam Vitals reviewed.  Constitutional:      General: She is awake. She is not in acute distress.    Appearance: Normal appearance. She is well-developed. She is not ill-appearing.     Comments: Very pleasant female  appears stated age in no acute distress sitting comfortably in exam room  HENT:     Head: Normocephalic and atraumatic.     Right Ear: Tympanic membrane, ear canal and external ear normal. Tympanic membrane is not erythematous or bulging.     Left Ear: Tympanic membrane, ear canal and external ear normal. Tympanic membrane is not erythematous or bulging.     Mouth/Throat:     Pharynx: Uvula midline. Posterior oropharyngeal erythema present. No oropharyngeal exudate.     Comments: Erythema and drainage in posterior oropharynx Cardiovascular:     Rate and Rhythm: Normal rate and regular rhythm.     Heart sounds: Normal heart sounds, S1 normal and S2  normal. No murmur heard. Pulmonary:     Effort: Pulmonary effort is normal.     Breath sounds: Wheezing present. No rhonchi or rales.     Comments: Scattered wheezing Psychiatric:        Behavior: Behavior is cooperative.      UC Treatments / Results  Labs (all labs ordered are listed, but only abnormal results are displayed) Labs Reviewed  POC INFLUENZA A AND B ANTIGEN (URGENT CARE ONLY) - Abnormal; Notable for the following components:      Result Value   INFLUENZA A ANTIGEN, POC POSITIVE (*)    All other components within normal limits    EKG   Radiology No results found.  Procedures Procedures (including critical care time)  Medications Ordered in UC Medications  ipratropium-albuterol (DUONEB) 0.5-2.5 (3) MG/3ML nebulizer solution 3 mL (3 mLs Nebulization Given 08/06/22 0859)    Initial Impression / Assessment and Plan / UC Course  I have reviewed the triage vital signs and the nursing notes.  Pertinent labs & imaging results that were available during my care of the patient were reviewed by me and considered in my medical decision making (see chart for details).     Patient is well-appearing, afebrile, nontoxic, nontachycardic, with oxygen saturation of 97%.  She did have wheezing on exam which improved with in office DuoNeb.  She tested positive for influenza A.  She is just outside the window of effectiveness for Tamiflu but given her history of diabetes will empirically treat with Tamiflu.  She was encouraged to use over-the-counter medication including guaifenesin, Flonase, Tylenol, ibuprofen for additional symptom relief.  She can use an albuterol inhaler every 4-6 hours as needed for shortness of breath and coughing fits.  Promethazine DM sent for cough and she was instructed that this can be sedating and she should not drive or drink alcohol with taking this medication.  She is to rest and drink plenty of fluid.  Discussed that if her symptoms worsen in any way  and she has fever not responding to medication, chest pain, shortness of breath, nausea/vomiting interfering with oral intake, weakness she needs to be seen immediately.  Strict return precautions given.  Work excuse note provided.  Final Clinical Impressions(s) / UC Diagnoses   Final diagnoses:  Influenza A  Influenzal bronchitis     Discharge Instructions      You tested positive for influenza A.  Please start Tamiflu twice daily for 5 days.  Use albuterol inhaler every 4-6 hours as needed for shortness of breath and coughing fits.  Use Promethazine DM for cough.  This will make you sleepy so do not drive or drink alcohol with taking it.  You can use over-the-counter medication including plain guaifenesin, Flonase, Tylenol, ibuprofen.  Make sure you rest and drink  plenty of fluid.  If your symptoms are improving within a week please return for reevaluation.  If anything worsens and you have shortness of breath, chest pain, fever not respond to medication, weakness you need to be seen immediately.     ED Prescriptions     Medication Sig Dispense Auth. Provider   albuterol (VENTOLIN HFA) 108 (90 Base) MCG/ACT inhaler Inhale 1-2 puffs into the lungs every 6 (six) hours as needed (cough). 8 g Fable Huisman K, PA-C   oseltamivir (TAMIFLU) 75 MG capsule Take 1 capsule (75 mg total) by mouth every 12 (twelve) hours. 10 capsule Jaimya Feliciano K, PA-C   promethazine-dextromethorphan (PROMETHAZINE-DM) 6.25-15 MG/5ML syrup Take 5 mLs by mouth 2 (two) times daily as needed for cough. 118 mL Madline Oesterling K, PA-C      PDMP not reviewed this encounter.   Terrilee Croak, PA-C 08/06/22 0932

## 2022-08-13 ENCOUNTER — Other Ambulatory Visit (HOSPITAL_COMMUNITY): Payer: Self-pay

## 2022-08-13 DIAGNOSIS — J069 Acute upper respiratory infection, unspecified: Secondary | ICD-10-CM | POA: Diagnosis not present

## 2022-08-13 DIAGNOSIS — J101 Influenza due to other identified influenza virus with other respiratory manifestations: Secondary | ICD-10-CM | POA: Diagnosis not present

## 2022-08-13 MED ORDER — PREDNISONE 10 MG PO TABS
ORAL_TABLET | ORAL | 0 refills | Status: AC
  Filled 2022-08-13: qty 21, 6d supply, fill #0

## 2022-08-13 MED ORDER — GUAIFENESIN-CODEINE 100-10 MG/5ML PO SOLN
5.0000 mL | Freq: Four times a day (QID) | ORAL | 0 refills | Status: DC
  Filled 2022-08-13: qty 140, 7d supply, fill #0

## 2022-08-18 ENCOUNTER — Other Ambulatory Visit (HOSPITAL_COMMUNITY): Payer: Self-pay

## 2022-08-18 DIAGNOSIS — M545 Low back pain, unspecified: Secondary | ICD-10-CM | POA: Diagnosis not present

## 2022-08-18 MED ORDER — METHOCARBAMOL 500 MG PO TABS
500.0000 mg | ORAL_TABLET | Freq: Four times a day (QID) | ORAL | 0 refills | Status: DC | PRN
  Filled 2022-08-18: qty 60, 15d supply, fill #0

## 2022-09-10 ENCOUNTER — Ambulatory Visit: Payer: Commercial Managed Care - PPO | Admitting: Cardiovascular Disease

## 2022-09-24 ENCOUNTER — Other Ambulatory Visit (HOSPITAL_COMMUNITY): Payer: Self-pay

## 2022-09-24 MED ORDER — MOUNJARO 10 MG/0.5ML ~~LOC~~ SOAJ
10.0000 mg | SUBCUTANEOUS | 5 refills | Status: DC
  Filled 2022-09-24: qty 2, 28d supply, fill #0
  Filled 2022-10-28: qty 2, 28d supply, fill #1
  Filled 2022-11-23: qty 2, 28d supply, fill #2
  Filled 2022-12-23: qty 2, 28d supply, fill #3
  Filled 2023-01-20: qty 2, 28d supply, fill #4
  Filled 2023-02-15: qty 2, 28d supply, fill #5

## 2022-10-01 ENCOUNTER — Other Ambulatory Visit (HOSPITAL_COMMUNITY): Payer: Self-pay

## 2022-10-01 MED ORDER — CELECOXIB 200 MG PO CAPS
200.0000 mg | ORAL_CAPSULE | Freq: Every day | ORAL | 1 refills | Status: DC | PRN
  Filled 2022-10-01: qty 30, 30d supply, fill #0

## 2022-10-19 ENCOUNTER — Other Ambulatory Visit (HOSPITAL_COMMUNITY): Payer: Self-pay

## 2022-10-28 ENCOUNTER — Other Ambulatory Visit (HOSPITAL_COMMUNITY): Payer: Self-pay

## 2022-10-29 ENCOUNTER — Other Ambulatory Visit (HOSPITAL_COMMUNITY): Payer: Self-pay

## 2022-10-29 MED ORDER — INSULIN LISPRO 100 UNIT/ML IJ SOLN
100.0000 [IU] | Freq: Every day | INTRAMUSCULAR | 5 refills | Status: DC
  Filled 2022-10-29: qty 90, 90d supply, fill #0

## 2022-10-30 ENCOUNTER — Other Ambulatory Visit (HOSPITAL_COMMUNITY): Payer: Self-pay

## 2022-11-02 ENCOUNTER — Encounter: Payer: Self-pay | Admitting: Cardiovascular Disease

## 2022-11-02 NOTE — Progress Notes (Unsigned)
Cardiology Office Note:    Date:  11/03/2022   ID:  Alexis Campos, DOB 08-18-1965, MRN [id number]  PCP:  [facility], [place] Providers Cardiologist:  Kathrynn Humble to update primary MD,subspecialty MD or APP then REFRESH:1}    Referring MD: [facility], [place]   Chief Complaint  Patient presents with   Chest Pain    June 22 ,2022   Alexis Campos is a 57 y.o. female with a hx of HTN, HLD. 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 Does the best she can while working .  Is not exercising regularly .   Has occasional left upper arm pain Lasts for a few minutes  Does not seem to be related to exertion .  Very strong family hx of CAD Mother, and 2 brothers  had cad in their 16s-50s.  Mother had DM,  brothers did not have DM , Brothers smoked cigarettes.    Uses the OmniPod for glucose control.  Dexicom  She has occasional episodes of hear flutters, Might last for a few minutes.  No lightheadedness,  Suggested  Jodelle Red  She had a heart catheterization in June, 2001 by Dr. Janene Madeira.  She had normal coronary arteries at that time.  She also recalls having a stress test several years later.  She had an episode of SVT during the stress test and she received adenosine for treatment for that. She has not had any recurrent episodes of SVT.  She has been taking metoprolol which seems to keep that under good control.   Oct. 3, 2022 Radiah is seen for follow up of an episode of chest pain several months ago. Coronary CT angio revealed : Coronary calcium score of 59.8. This was 92nd percentile for age and sex matched control.  LM: normal LAD :  mild calcified plaque LCx: normal RCA :  mild stenosis  We discussed lipid control and diabetic  control.  Lipids  She is on crestor, fenofibrate, and vasepa   She is starting a very intense diet, exercise, weight loss program with a Physiological scientist.  She is looking forward to seeing how her lipids improve over the next 3 months.  We will plan on checking her lipids again in 3 months from now.   Developed Covid in July. Taste is still not normal .  Was out of work for 6 weeks.  Had lots of hyperglycemia   Jan 13, 2022: Alexis Campos is seen today for follow up ofher CP, HLD ,  HTN. Coronary calcium score of 59.8. This was 92nd percentile for age Coronary CT angiogram from July, 2022  RCA: Mild irregularities Left main: Normal LAD: Minor proximal irregularities LAD: Small and nondominant with no plaque.  Was in the hospital for seveal days with pyelonephritis ( possibly from a renal stone )  Still recovering from that . Glucose has not been all that well controlled. .   She still not feeling very well.  She still has a poor appetite and her strength is very poor.  I think she still has lots of improvement to go a full week get her back on her typical dose of metoprolol 150 mg twice daily.  November 03, 2022 Alexis Campos is seen for follow  up of her CP,HLD, HTN , CAC score is 59.8 - 92nd percentile for age / sex matched controls  Cor CTA from July 2022 : RCA: Mild irregularities Left main: Normal LAD: Minor proximal irregularities LAD: Small and nondominant with no plaque.  Has lost about 30 lbs   Her last LDL was 142 Her goal LDL is 50-70   Past Medical History:  Diagnosis Date   Anemia    Anemia 05/22/2014   Blood transfusion    Diabetes mellitus    diagnosed 1996-insulin and metformin   Endometriosis    Fibromyalgia    GERD (gastroesophageal reflux disease)    History of blood transfusion 12/09   [facility]   Hyperlipidemia    Hypertension    Neuromuscular disorder ([place])    fibromyalgia   Seasonal allergies    recent bronchitis-chest xray/ct scan-granuloma noted present  since 2008   Sinusitis    Splenomegaly 05/22/2014    Past Surgical History:  Procedure Laterality Date   ABDOMINAL SURGERY     LOA for endomeriosis   APPENDECTOMY  05/1995   CESAREAN SECTION     CHOLECYSTECTOMY  01/1995   COLONOSCOPY     lysis of adhesions  1996/ 2002   TONSILLECTOMY  09/1994    Current Medications: Current Meds  Medication Sig   Continuous Blood Gluc Receiver (DEXCOM G6 RECEIVER) DEVI Use as directed.   diphenhydramine-acetaminophen (TYLENOL PM) 25-500 MG TABS tablet Take 2 tablets by mouth at bedtime as needed (sleep).   doxycycline (VIBRAMYCIN) 100 MG capsule Take 1 capsule (100 mg total) by mouth 2 (two) times daily for 7 days.   escitalopram (LEXAPRO) 20 MG tablet TAKE 1 TABLET BY MOUTH ONCE DAILY   fenofibrate (TRICOR) 145 MG tablet TAKE 1 TABLET BY MOUTH ONCE DAILY   icosapent Ethyl (VASCEPA) 1 g capsule Take 2 capsules (2 g total) by mouth 2 (two) times daily.   Insulin Disposable Pump (OMNIPOD 5 G6 POD, GEN 5,) MISC Use as directed every 2 days   insulin glargine-yfgn (SEMGLEE, YFGN,) 100 UNIT/ML Pen Inject 65 Units into the skin daily.   insulin lispro (HUMALOG) 100 UNIT/ML injection Inject 100 Units  into the skin daily via insulin pump   methocarbamol (ROBAXIN) 500 MG tablet Take 1 tablet (500 mg total) by mouth every 6-8 hours as needed for spasms   metoprolol tartrate (LOPRESSOR) 50 MG tablet Take 3 tablets (150 mg total) by mouth 2 (two) times daily.   rosuvastatin (CRESTOR) 10 MG tablet Take 1 tablet (10 mg total) by mouth daily.   tirzepatide (MOUNJARO) 10 MG/0.5ML Pen Inject 10 mg into the skin once a week.   [DISCONTINUED] metoprolol tartrate (LOPRESSOR) 50 MG tablet Take 100 mg by mouth 2 (two) times daily.     Allergies:   Codeine, Duloxetine hcl, Lisinopril, and Metoclopramide hcl   Social History   Socioeconomic History   Marital status: Single    Spouse name: Not on file   Number of children: Not on file   Years of education: Not on  file   Highest education level: Not on file  Occupational History   Not on file  Tobacco Use   Smoking status: Never   Smokeless tobacco: Never  Vaping Use   Vaping Use: Never used  Substance and Sexual Activity   Alcohol use: No   Drug use: No   Sexual activity: Not on file  Other Topics Concern   Not on file  Social History Narrative   Not on  file   Social Determinants of Health   Financial Resource Strain: Not on file  Food Insecurity: Not on file  Transportation Needs: Not on file  Physical Activity: Not on file  Stress: Not on file  Social Connections: Not on file     Family History: The patient's family history includes Anemia in her mother; CAD in her brother and father; Coronary artery disease in her mother.  ROS:   Please see the history of present illness.     All other systems reviewed and are negative.  EKGs/Labs/Other Studies Reviewed:    The following studies were reviewed today:   Recent Labs: 12/28/2021: B Natriuretic Peptide 219.4; Hemoglobin 9.8; Magnesium 2.0; Platelets 100 12/29/2021: BUN 26; Creatinine, Ser 1.06; Potassium 3.2; Sodium 134 01/06/2022: ALT 16  Recent Lipid Panel    Component Value Date/Time   CHOL 250 (H) 01/06/2022 0923   TRIG 399 (H) 01/06/2022 0923   HDL 34 (L) 01/06/2022 0923   CHOLHDL 7.4 (H) 01/06/2022 0923   LDLCALC 142 (H) 01/06/2022 0923     Risk Assessment/Calculations:           Physical Exam:    Physical Exam: Blood pressure 126/78, pulse 89, height 5\' 10"  (1.778 m), weight 198 lb 9.6 oz (90.1 kg), last menstrual period 06/14/2018, SpO2 99 %.       GEN:  Well nourished, well developed in no acute distress HEENT: Normal NECK: No JVD; No carotid bruits LYMPHATICS: No lymphadenopathy CARDIAC: RRR , no murmurs, rubs, gallops RESPIRATORY:  Clear to auscultation without rales, wheezing or rhonchi  ABDOMEN: Soft, non-tender, non-distended MUSCULOSKELETAL:  No edema; No deformity  SKIN: Warm and  dry NEUROLOGIC:  Alert and oriented x 3    ECG:    ASSESSMENT:    1. Pure hypercholesterolemia   2. Medication management       PLAN:      Chest pain:    2.  Hyperlipidemia:   her last LDL was 142. Goal is 50-70 Cont current meds.  Check labs ( lipids, BMP, LP(a)  on Thursday  I would have a low threshold to refer her to the lipid clinic for consideration for PCSK9 inhibitor if we cannot get her to goal  She had a family hx of premature CAD           Medication Adjustments/Labs and Tests Ordered: Current medicines are reviewed at length with the patient today.  Concerns regarding medicines are outlined above.  Orders Placed This Encounter  Procedures   Lipid panel   ALT   Basic metabolic panel   Lipoprotein A (LPA)    No orders of the defined types were placed in this encounter.    Patient Instructions  Medication Instructions:  CONTINUE Metoprolol Tartrate 150mg  twice daily *If you need a refill on your cardiac medications before your next appointment, please call your pharmacy*   Lab Work: Lipids, ALT, BMET, Lipoprotein(a) on Thursday 11/05/22 If you have labs (blood work) drawn today and your tests are completely normal, you will receive your results only by: [place] (if you have MyChart) OR A paper copy in the mail If you have any lab test that is abnormal or we need to change your treatment, we will call you to review the results.   Testing/Procedures: NONE   Follow-Up: At [facility], you and your health needs are our priority.  As part of our continuing mission to provide you with exceptional heart care, we have created designated Provider  Care Teams.  These Care Teams include your primary Cardiologist (physician) and Advanced Practice Providers (APPs -  Physician Assistants and Nurse Practitioners) who all work together to provide you with the care you need, when you need it.  We recommend signing up for the patient portal  called "MyChart".  Sign up information is provided on this After Visit Summary.  MyChart is used to connect with patients for Virtual Visits (Telemedicine).  Patients are able to view lab/test results, encounter notes, upcoming appointments, etc.  Non-urgent messages can be sent to your provider as well.   To learn more about what you can do with MyChart, go to [email or web address].    Your next appointment:   1 year(s)  Provider:   Mertie Moores, MD     Signed, Mertie Moores, MD  11/03/2022 5:04 PM    [place]

## 2022-11-03 ENCOUNTER — Encounter: Payer: Self-pay | Admitting: Cardiovascular Disease

## 2022-11-03 ENCOUNTER — Ambulatory Visit: Payer: Commercial Managed Care - PPO | Attending: Cardiovascular Disease | Admitting: Cardiovascular Disease

## 2022-11-03 VITALS — BP 126/78 | HR 89 | Ht 70.0 in | Wt 198.6 lb

## 2022-11-03 DIAGNOSIS — Z79899 Other long term (current) drug therapy: Secondary | ICD-10-CM

## 2022-11-03 DIAGNOSIS — E7800 Pure hypercholesterolemia, unspecified: Secondary | ICD-10-CM

## 2022-11-03 NOTE — Patient Instructions (Signed)
Medication Instructions:  CONTINUE Metoprolol Tartrate 150mg  twice daily *If you need a refill on your cardiac medications before your next appointment, please call your pharmacy*   Lab Work: Lipids, ALT, BMET, Lipoprotein(a) on Thursday 11/05/22 If you have labs (blood work) drawn today and your tests are completely normal, you will receive your results only by: [place] (if you have MyChart) OR A paper copy in the mail If you have any lab test that is abnormal or we need to change your treatment, we will call you to review the results.   Testing/Procedures: NONE   Follow-Up: At [facility], you and your health needs are our priority.  As part of our continuing mission to provide you with exceptional heart care, we have created designated Provider Care Teams.  These Care Teams include your primary Cardiologist (physician) and Advanced Practice Providers (APPs -  Physician Assistants and Nurse Practitioners) who all work together to provide you with the care you need, when you need it.  We recommend signing up for the patient portal called "MyChart".  Sign up information is provided on this After Visit Summary.  MyChart is used to connect with patients for Virtual Visits (Telemedicine).  Patients are able to view lab/test results, encounter notes, upcoming appointments, etc.  Non-urgent messages can be sent to your provider as well.   To learn more about what you can do with MyChart, go to [email or web address].    Your next appointment:   1 year(s)  Provider:   Mertie Moores, MD

## 2022-11-05 ENCOUNTER — Ambulatory Visit: Payer: Commercial Managed Care - PPO | Attending: Cardiovascular Disease

## 2022-11-05 DIAGNOSIS — E7800 Pure hypercholesterolemia, unspecified: Secondary | ICD-10-CM

## 2022-11-05 DIAGNOSIS — Z79899 Other long term (current) drug therapy: Secondary | ICD-10-CM

## 2022-11-06 DIAGNOSIS — E559 Vitamin D deficiency, unspecified: Secondary | ICD-10-CM | POA: Diagnosis not present

## 2022-11-06 DIAGNOSIS — E038 Other specified hypothyroidism: Secondary | ICD-10-CM | POA: Diagnosis not present

## 2022-11-06 DIAGNOSIS — E1165 Type 2 diabetes mellitus with hyperglycemia: Secondary | ICD-10-CM | POA: Diagnosis not present

## 2022-11-06 DIAGNOSIS — E7800 Pure hypercholesterolemia, unspecified: Secondary | ICD-10-CM | POA: Diagnosis not present

## 2022-11-06 DIAGNOSIS — E538 Deficiency of other specified B group vitamins: Secondary | ICD-10-CM | POA: Diagnosis not present

## 2022-11-06 LAB — ALT: ALT: 21 IU/L (ref 0–32)

## 2022-11-06 LAB — BASIC METABOLIC PANEL
BUN/Creatinine Ratio: 21 (ref 9–23)
BUN: 35 mg/dL — ABNORMAL HIGH (ref 6–24)
CO2: 21 mmol/L (ref 20–29)
Calcium: 9.8 mg/dL (ref 8.7–10.2)
Chloride: 102 mmol/L (ref 96–106)
Creatinine, Ser: 1.69 mg/dL — ABNORMAL HIGH (ref 0.57–1.00)
Glucose: 136 mg/dL — ABNORMAL HIGH (ref 70–99)
Potassium: 4.4 mmol/L (ref 3.5–5.2)
Sodium: 138 mmol/L (ref 134–144)
eGFR: 35 mL/min/{1.73_m2} — ABNORMAL LOW (ref >59)

## 2022-11-06 LAB — LIPOPROTEIN A (LPA): Lipoprotein (a): 50.8 nmol/L (ref <75.0)

## 2022-11-06 LAB — LIPID PANEL
Chol/HDL Ratio: 7.2 ratio — ABNORMAL HIGH (ref 0.0–4.4)
Cholesterol, Total: 237 mg/dL — ABNORMAL HIGH (ref 100–199)
HDL: 33 mg/dL — ABNORMAL LOW (ref >39)
LDL Chol Calc (NIH): 157 mg/dL — ABNORMAL HIGH (ref 0–99)
Triglycerides: 252 mg/dL — ABNORMAL HIGH (ref 0–149)
VLDL Cholesterol Cal: 47 mg/dL — ABNORMAL HIGH (ref 5–40)

## 2022-11-09 DIAGNOSIS — E7800 Pure hypercholesterolemia, unspecified: Secondary | ICD-10-CM | POA: Diagnosis not present

## 2022-11-09 DIAGNOSIS — E1165 Type 2 diabetes mellitus with hyperglycemia: Secondary | ICD-10-CM | POA: Diagnosis not present

## 2022-11-09 DIAGNOSIS — I10 Essential (primary) hypertension: Secondary | ICD-10-CM | POA: Diagnosis not present

## 2022-11-09 DIAGNOSIS — Z9641 Presence of insulin pump (external) (internal): Secondary | ICD-10-CM | POA: Diagnosis not present

## 2022-11-11 ENCOUNTER — Telehealth: Payer: Self-pay | Admitting: Cardiovascular Disease

## 2022-11-11 DIAGNOSIS — E7800 Pure hypercholesterolemia, unspecified: Secondary | ICD-10-CM

## 2022-11-11 NOTE — Telephone Encounter (Signed)
-----   Message from Thayer Headings, MD sent at 11/06/2022  1:06 PM EDT ----- Please refer to the lipid clinic for consideration for PCSK9 inhibitor

## 2022-11-11 NOTE — Telephone Encounter (Signed)
Patient has viewed physician's recommendation via [place]. Referral placed to lipid clinic at this time.

## 2022-11-23 ENCOUNTER — Other Ambulatory Visit (HOSPITAL_COMMUNITY): Payer: Self-pay

## 2022-11-23 ENCOUNTER — Ambulatory Visit: Payer: Commercial Managed Care - PPO | Attending: Cardiovascular Disease | Admitting: Pharmacist

## 2022-11-23 DIAGNOSIS — E785 Hyperlipidemia, unspecified: Secondary | ICD-10-CM | POA: Insufficient documentation

## 2022-11-23 DIAGNOSIS — E7800 Pure hypercholesterolemia, unspecified: Secondary | ICD-10-CM

## 2022-11-23 MED ORDER — FENOFIBRATE 48 MG PO TABS
48.0000 mg | ORAL_TABLET | Freq: Every day | ORAL | 3 refills | Status: DC
  Filled 2022-11-23: qty 90, 90d supply, fill #0
  Filled 2023-04-21: qty 90, 90d supply, fill #1

## 2022-11-23 MED ORDER — ROSUVASTATIN CALCIUM 40 MG PO TABS
40.0000 mg | ORAL_TABLET | Freq: Every day | ORAL | 3 refills | Status: DC
  Filled 2022-11-23: qty 90, 90d supply, fill #0
  Filled 2023-04-21: qty 90, 90d supply, fill #1

## 2022-11-23 MED ORDER — VASCEPA 1 G PO CAPS
2.0000 g | ORAL_CAPSULE | Freq: Two times a day (BID) | ORAL | 3 refills | Status: DC
  Filled 2022-11-23: qty 360, 90d supply, fill #0
  Filled 2023-04-21: qty 360, 90d supply, fill #1
  Filled 2023-09-06: qty 360, 90d supply, fill #2

## 2022-11-23 NOTE — Progress Notes (Signed)
Patient ID: Alexis Campos                 DOB: May 23, 1966                    MRN: [id number]     HPI: Alexis Campos is a 57 y.o. female patient referred to lipid clinic by Dr. Elease Campos. PMH is significant for minimal non-obstructive CAD with CAC score 60 (92nd percentile) on 02/20/2021, HTN, T2DM,  CKD, and obesity, HLD. At last visit with Dr. Elease Campos on 11/03/2022, no changes were made to her HLD regimen. A lipid panel and Lp(a) were ordered for 11/05/2022. The lipid panel showed LDL 157 (on rosuvastatin 10 mg daily) which is slightly increased from last year, and Lp(a) 60 nmol/L.   Today, patient presents for optimization of LLT. Patient states that she is adherent to rosuvastatin and fenofibrate, though she misses ~2 doses per week. She takes Vascepa, but notes she is taking it once daily on most days because she forgets to take the second dose. Patient reports she has lost ~30 lbs since starting Moujaro and implementing dietary modifications. Discussed potentially increasing dose of rosuvastatin, consolidating Vascepa to 4 g once daily. Patient seems receptive to these changes.  Current Medications: rosuvastatin 10 mg daily, fenofibrate 145 mg daily, Vascepa 2 g BID Intolerances: none documented. Risk Factors: HTN, T2DM, CAC score/CAD, family history LDL goal: < 70 mg/dL   Diet: Eats 2 meals per day. Eating primarily lean meats, plenty of vegetables. Limits carbs, sweets, doesn't snack often. Has trouble staying hydrated, drinks diet Alexis Campos occasionally when she works nights.  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  Family History: Mother - premature CAD, AF, HTN, HLD, T2DM, CKD. Brothers: premature CAD.  Social History: never-smoker, no alcohol or illicit drug use.  Labs:  11/06/2022: A1c 7.6% 11/05/2022: TC 237, LDL 157, HDL 33, TG 252 11/05/2022: Lp(a) 51 nmol/L 01/06/2022: TC 250, LDL 142, HDL 34, TG 399 02/20/2021: CAC 60 (92nd percentile)    Past Medical History:  Diagnosis Date   Anemia    Anemia 05/22/2014   Blood transfusion    Diabetes mellitus    diagnosed 1996-insulin and metformin   Endometriosis    Fibromyalgia    GERD (gastroesophageal reflux disease)    History of blood transfusion 12/09   [facility]   Hyperlipidemia    Hypertension    Neuromuscular disorder (HCC)    fibromyalgia   Seasonal allergies    recent bronchitis-chest xray/ct scan-granuloma noted present since 2008   Sinusitis    Splenomegaly 05/22/2014    Current Outpatient Medications on File Prior to Visit  Medication Sig Dispense Refill   Continuous Blood Gluc Receiver (DEXCOM G6 RECEIVER) DEVI Use as directed. 1 each 1   diphenhydramine-acetaminophen (TYLENOL PM) 25-500 MG TABS tablet Take 2 tablets by mouth at bedtime as needed (sleep).     doxycycline (VIBRAMYCIN) 100 MG capsule Take 1 capsule (100 mg total) by mouth 2 (two) times daily for 7 days. 14 capsule 0   escitalopram (LEXAPRO) 20 MG tablet TAKE 1 TABLET BY MOUTH ONCE DAILY 90 tablet 4   fenofibrate (TRICOR) 145 MG tablet TAKE 1 TABLET BY MOUTH ONCE DAILY 90 tablet 4   icosapent Ethyl (VASCEPA) 1 g capsule Take 2 capsules (2 g total) by mouth 2 (two) times daily. 360 capsule 2   Insulin Disposable Pump (OMNIPOD 5 G6 POD, GEN 5,) MISC Use  as directed every 2 days 15 each 6   insulin glargine-yfgn (SEMGLEE, YFGN,) 100 UNIT/ML Pen Inject 65 Units into the skin daily. 30 mL 6   insulin lispro (HUMALOG) 100 UNIT/ML injection Inject 100 Units  into the skin daily via insulin pump 90 mL 5   methocarbamol (ROBAXIN) 500 MG tablet Take 1 tablet (500 mg total) by mouth every 6-8 hours as needed for spasms 60 tablet 0   metoprolol tartrate (LOPRESSOR) 50 MG tablet Take 3 tablets (150 mg total) by mouth 2 (two) times daily. 540 tablet 4   rosuvastatin (CRESTOR) 10 MG tablet Take 1 tablet (10 mg total) by mouth daily. 90 tablet 2   tirzepatide (MOUNJARO) 10 MG/0.5ML Pen Inject 10 mg into the skin once  a week. 2 mL 5   [DISCONTINUED] insulin glargine (SEMGLEE) 100 UNIT/ML injection Inject 65 units into the skin daily. 10 mL 5   No current facility-administered medications on file prior to visit.    Allergies  Allergen Reactions   Codeine Itching   Duloxetine Hcl Other (See Comments)    Adverse effect - Tremor   Lisinopril Itching   Metoclopramide Hcl Nausea Only    Restless leg     Assessment/Plan:  1. Hyperlipidemia - Patient presents with LDL 157 mg/dL above goal < 70 mg/dL on rosuvastatin 10 mg daily. Patient states that she is generally adherent to rosuvastatin, though she notes missing ~2 doses per week. She has tolerated it well. She is open to increasing the dose of rosuvastatin. Briefly discussed additional LDL lowering meds including ezetimibe, Nexlizet, and PCSK9i. Pt does not want to add additional medication at this time.  - Increase rosuvastatin from 10 mg to 40 mg daily  2. Hypertriglyceridemia - Patient presents with TG 252 mg/dL above goal < 582 mg/dL on Vascepa 2 g BID and fenofibrate 145 mg daily. She misses ~2 doses of fenofibrate per week, and generally only takes the Vascepa once daily because she forgets her second dose.  - Start taking Vascepa 4 g once daily (instead of 2 g BID) to help with adherence - Decrease fenofibrate from 145 mg daily to 48 mg daily given renal function - Encouraged further dietary improvements and counseled on Mediterranean diet. - Anticipate that cholesterol will continue to improve as she loses weight with Mounjaro, and her DM is better controlled as well. - Recheck fasting lipids in early July.  Patient seen with Alexis Campos  Alexis Campos [facility] [address], [place], [place] [address] Phone: [phone]; Fax: [phone] 11/23/2022 4:18 PM

## 2022-11-23 NOTE — Patient Instructions (Addendum)
Your LDL cholesterol is 157 and your goal is < 70 - Increase your rosuvastatin from 10mg  to 40mg  daily  Your triglycerides are 252 and your goal is < 150 - Decrease your fenofibrate to 48mg  daily - You can take your Vascepa 4 capsules at once with food if it's easier to remember than twice daily dosing  Try setting an alarm for your meds or placing them in a location like next to your toothbrush where you'll see them every day  Recheck fasting cholesterol in 3 months

## 2022-11-24 ENCOUNTER — Other Ambulatory Visit (HOSPITAL_COMMUNITY): Payer: Self-pay

## 2022-11-26 ENCOUNTER — Other Ambulatory Visit (HOSPITAL_COMMUNITY): Payer: Self-pay

## 2022-12-17 DIAGNOSIS — H25813 Combined forms of age-related cataract, bilateral: Secondary | ICD-10-CM | POA: Diagnosis not present

## 2022-12-23 ENCOUNTER — Other Ambulatory Visit (HOSPITAL_COMMUNITY): Payer: Self-pay

## 2023-01-20 ENCOUNTER — Other Ambulatory Visit (HOSPITAL_COMMUNITY): Payer: Self-pay

## 2023-01-26 ENCOUNTER — Other Ambulatory Visit (HOSPITAL_COMMUNITY): Payer: Self-pay

## 2023-01-27 ENCOUNTER — Other Ambulatory Visit (HOSPITAL_COMMUNITY): Payer: Self-pay

## 2023-02-01 IMAGING — CT CT RENAL STONE PROTOCOL
2 of 4 series · 16 of 46 positions shown, 18 images · non-contrast
Comparison: Renal protocol CT July 15, 2018.

CLINICAL DATA: Flank pain, kidney stone suspected right



[Series 2: stone full · axial · 0.75mm/px · z∈[+743,+1188]mm · 13 of 99 slices shown, 15 images]
[im 5/99  soft-tissue]
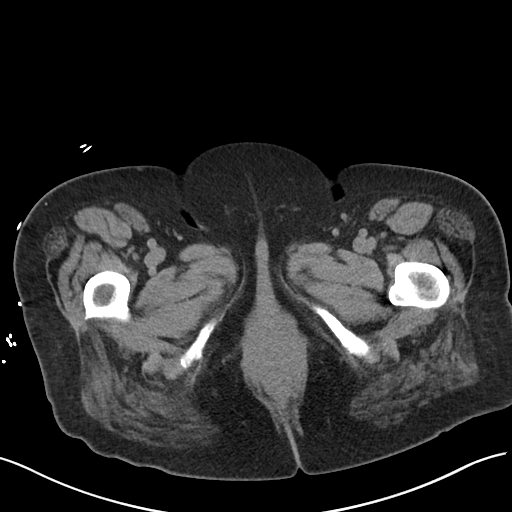
[im 5/99  bone]
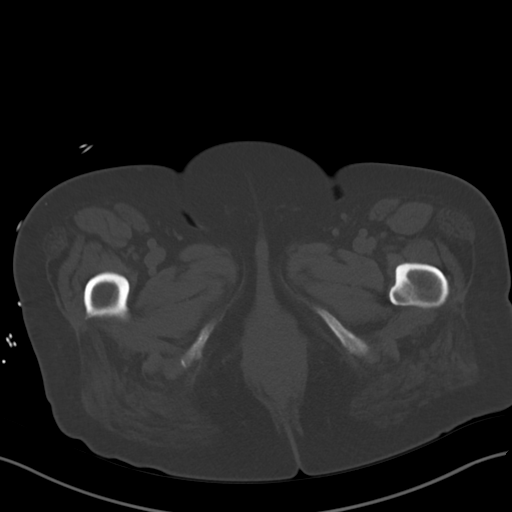
[im 13/99  soft-tissue]
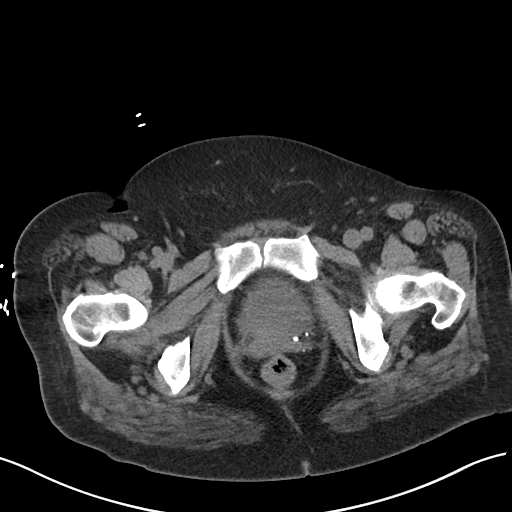
[im 21/99  soft-tissue]
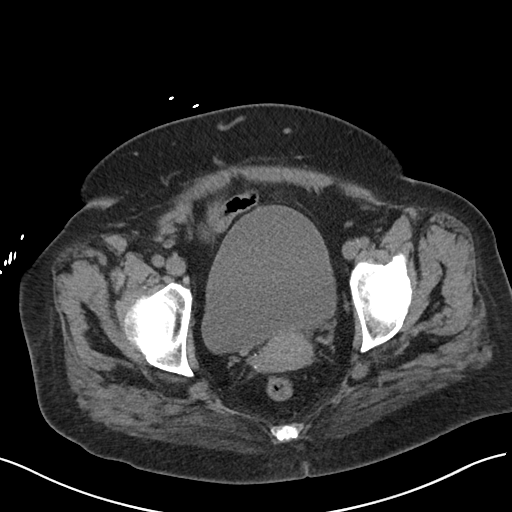
[im 29/99  soft-tissue]
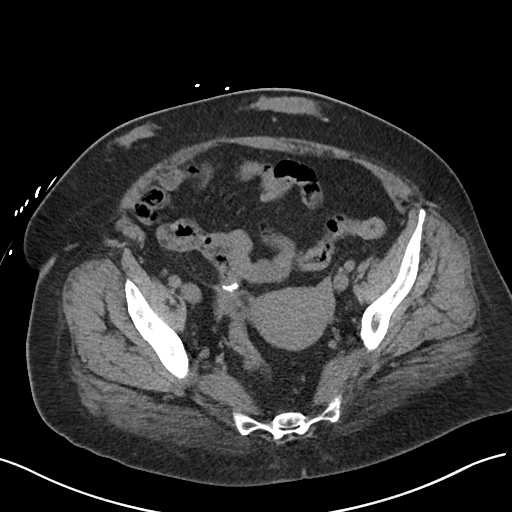
[im 33/99  soft-tissue]
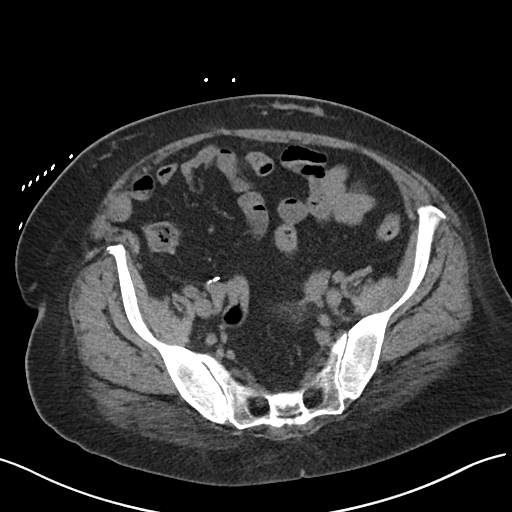
[im 41/99  soft-tissue]
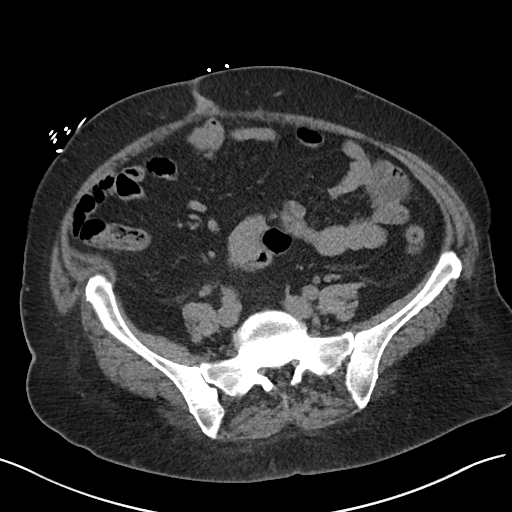
[im 50/99  soft-tissue]
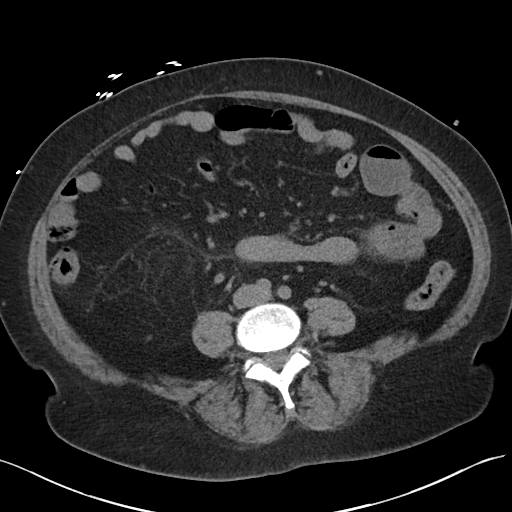
[im 58/99  soft-tissue]
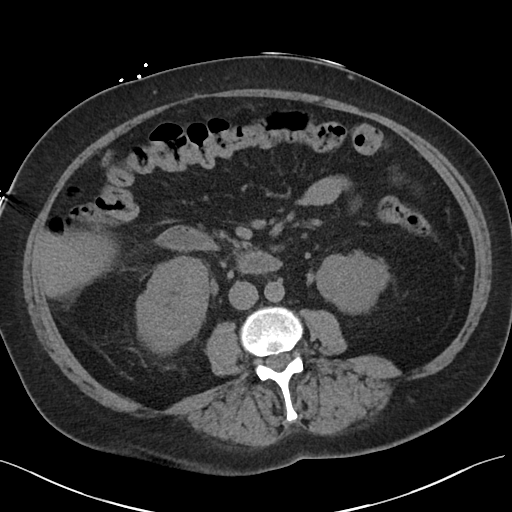
[im 66/99  soft-tissue]
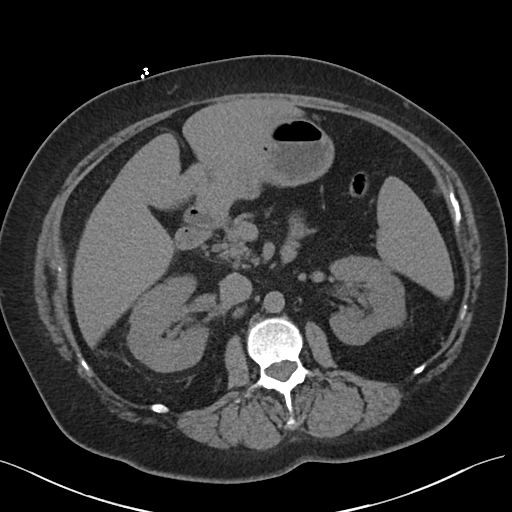
[im 66/99  bone]
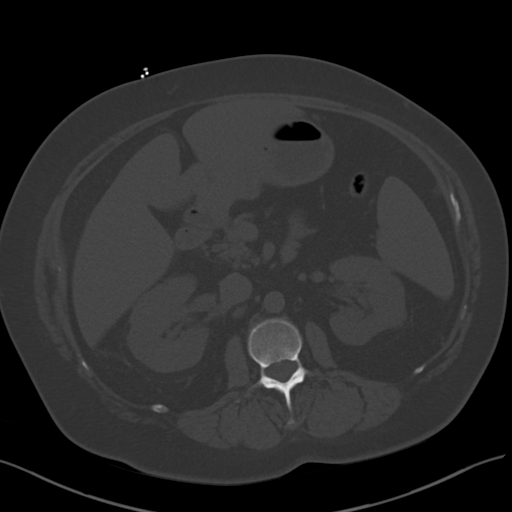
[im 70/99  soft-tissue]
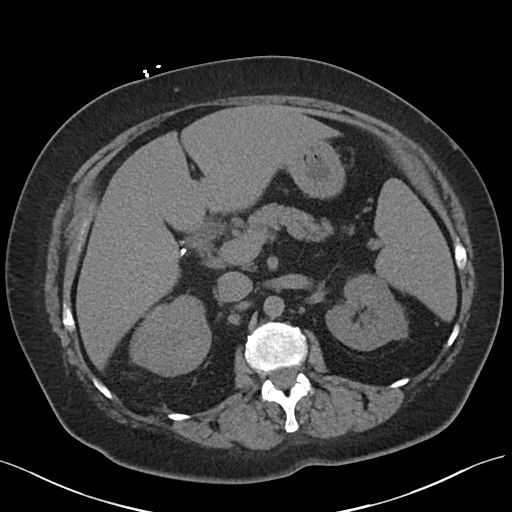
[im 78/99  soft-tissue]
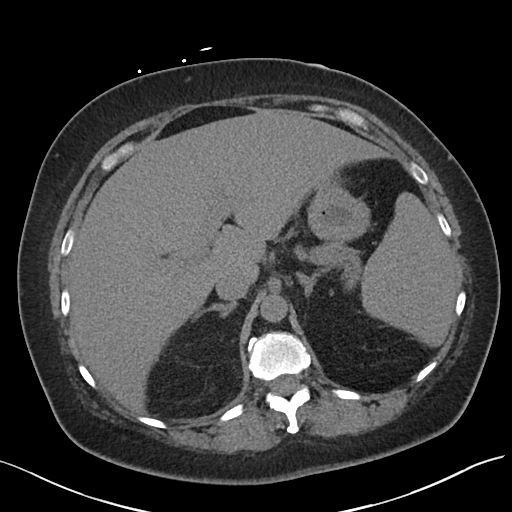
[im 86/99  soft-tissue]
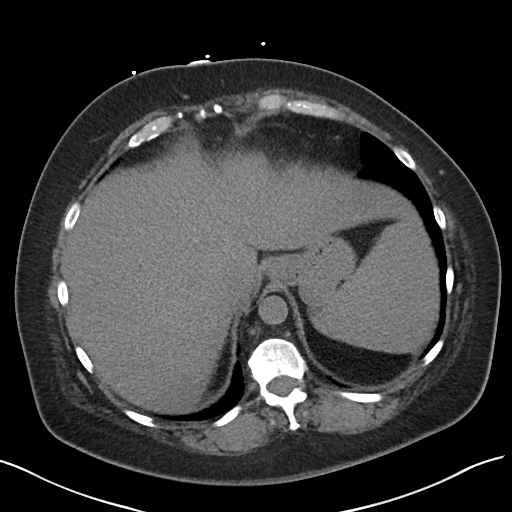
[im 94/99  soft-tissue]
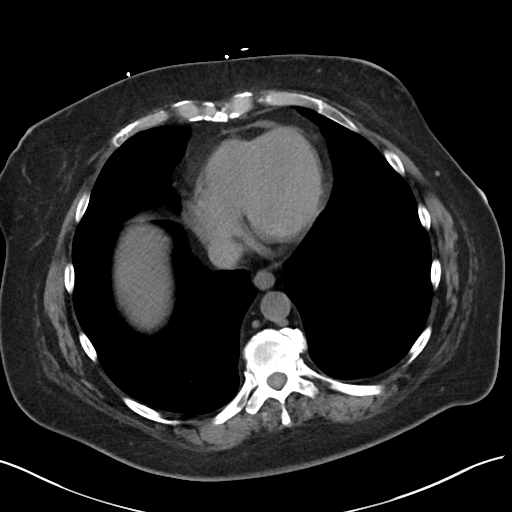

[Series 5: coronal · coronal · 0.78mm/px · 3 of 108 slices shown]
[im 36/108  soft-tissue]
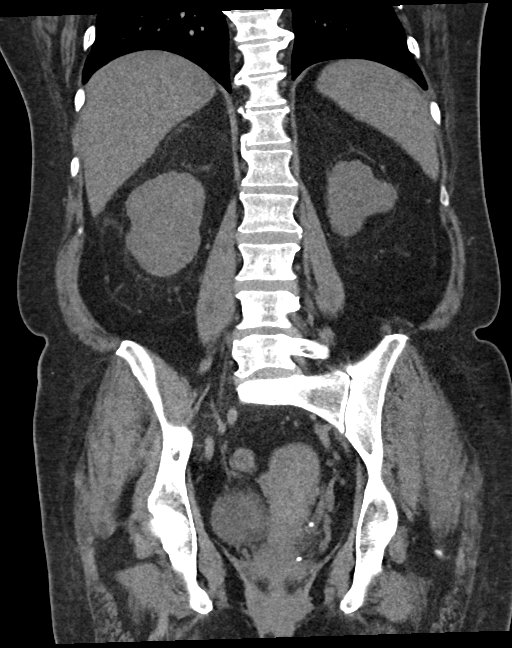
[im 48/108  soft-tissue]
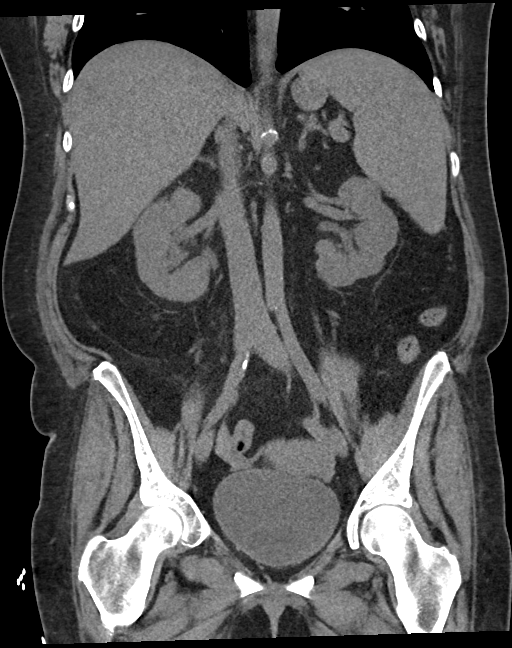
[im 60/108  soft-tissue]
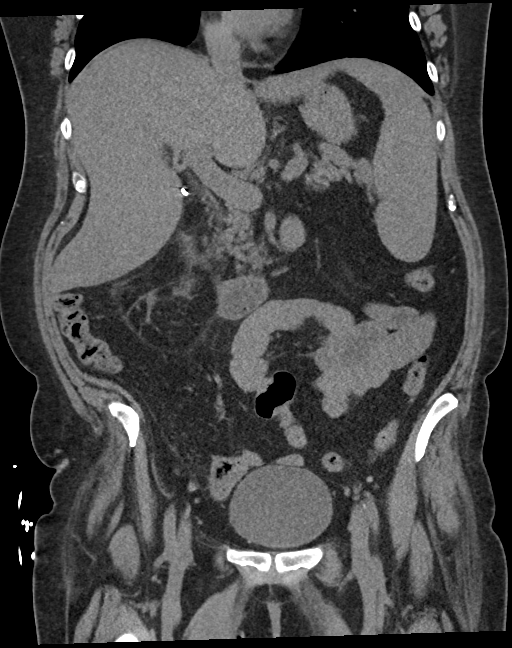

[16 of 46 positions shown; findings below may reference images not displayed]

FINDINGS: Lower chest: No acute abnormality.

Hepatobiliary: No focal liver abnormality is seen. Status post
cholecystectomy. No biliary dilatation.

Pancreas: Unremarkable. No pancreatic ductal dilatation or
surrounding inflammatory changes.

Spleen: Normal in size without focal abnormality.

Adrenals/Urinary Tract: Adrenal glands are unremarkable. Mild right
caliectasis with stranding around the right kidney and right ureter.
No renal or ureteric calculi identified. Punctate left renal
calculus versus papillary tip calcification. No left hydronephrosis.

Stomach/Bowel: Stomach is within normal limits. Appendectomy. No
evidence of bowel wall thickening, distention, or inflammatory
changes.

Vascular/Lymphatic: Aortic atherosclerosis. No enlarged abdominal or
pelvic lymph nodes.

Reproductive: Uterus and bilateral adnexa are unremarkable.

Other: No abdominal wall hernia or abnormality. No abdominopelvic
ascites.

Musculoskeletal: Mild lower lumbar degenerative change.
IMPRESSION: 1. Mild right renal caliectasis with stranding/edema around the
right kidney and right ureter. Findings could represent the sequela
of recently passed stone and/or ascending infection. No calculus
identified. Recommend correlation with urinalysis.
2. Appendectomy and cholecystectomy.

## 2023-02-01 IMAGING — DX DG CHEST 1V PORT
1 series · 1 of 1 positions shown · non-contrast
Comparison: [DATE]

CLINICAL DATA: Burning with urination and chills. Concern for
sepsis.

EXAM:
PORTABLE CHEST 1 VIEW

[chest ap]
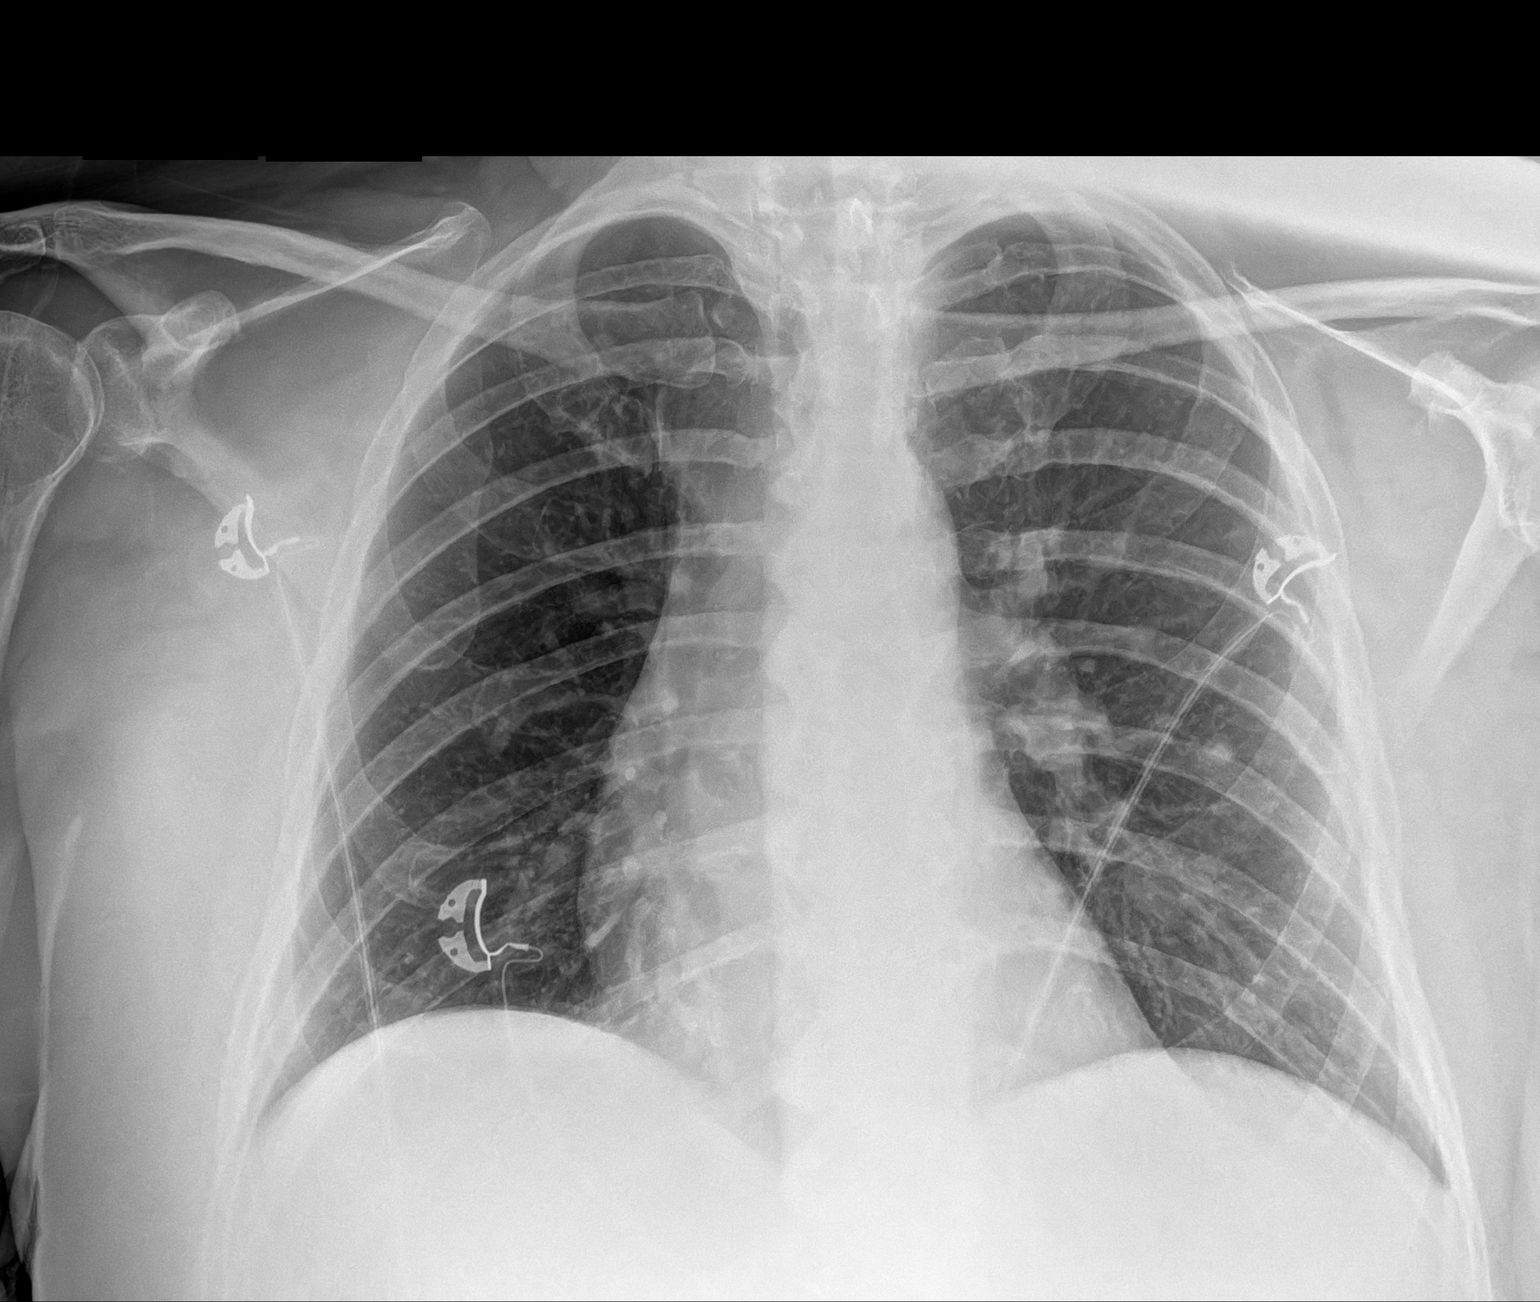

[1 of 1 positions shown; findings below may reference images not displayed]

FINDINGS: Heart size and mediastinal contours are unremarkable. No pleural
effusion or edema. Unchanged calcified nodule within the left
midlung compared with 04/30/2012 compatible with granuloma.
Visualized osseous structures are unremarkable.
IMPRESSION: No active cardiopulmonary abnormalities.

## 2023-02-04 IMAGING — DX DG CHEST 1V PORT SAME DAY
1 series · 1 of 1 positions shown · non-contrast
Comparison: Chest x-ray dated December 26, 2021.

CLINICAL DATA: Cough.  Possible sepsis.

EXAM:
PORTABLE CHEST 1 VIEW

[chest ap]
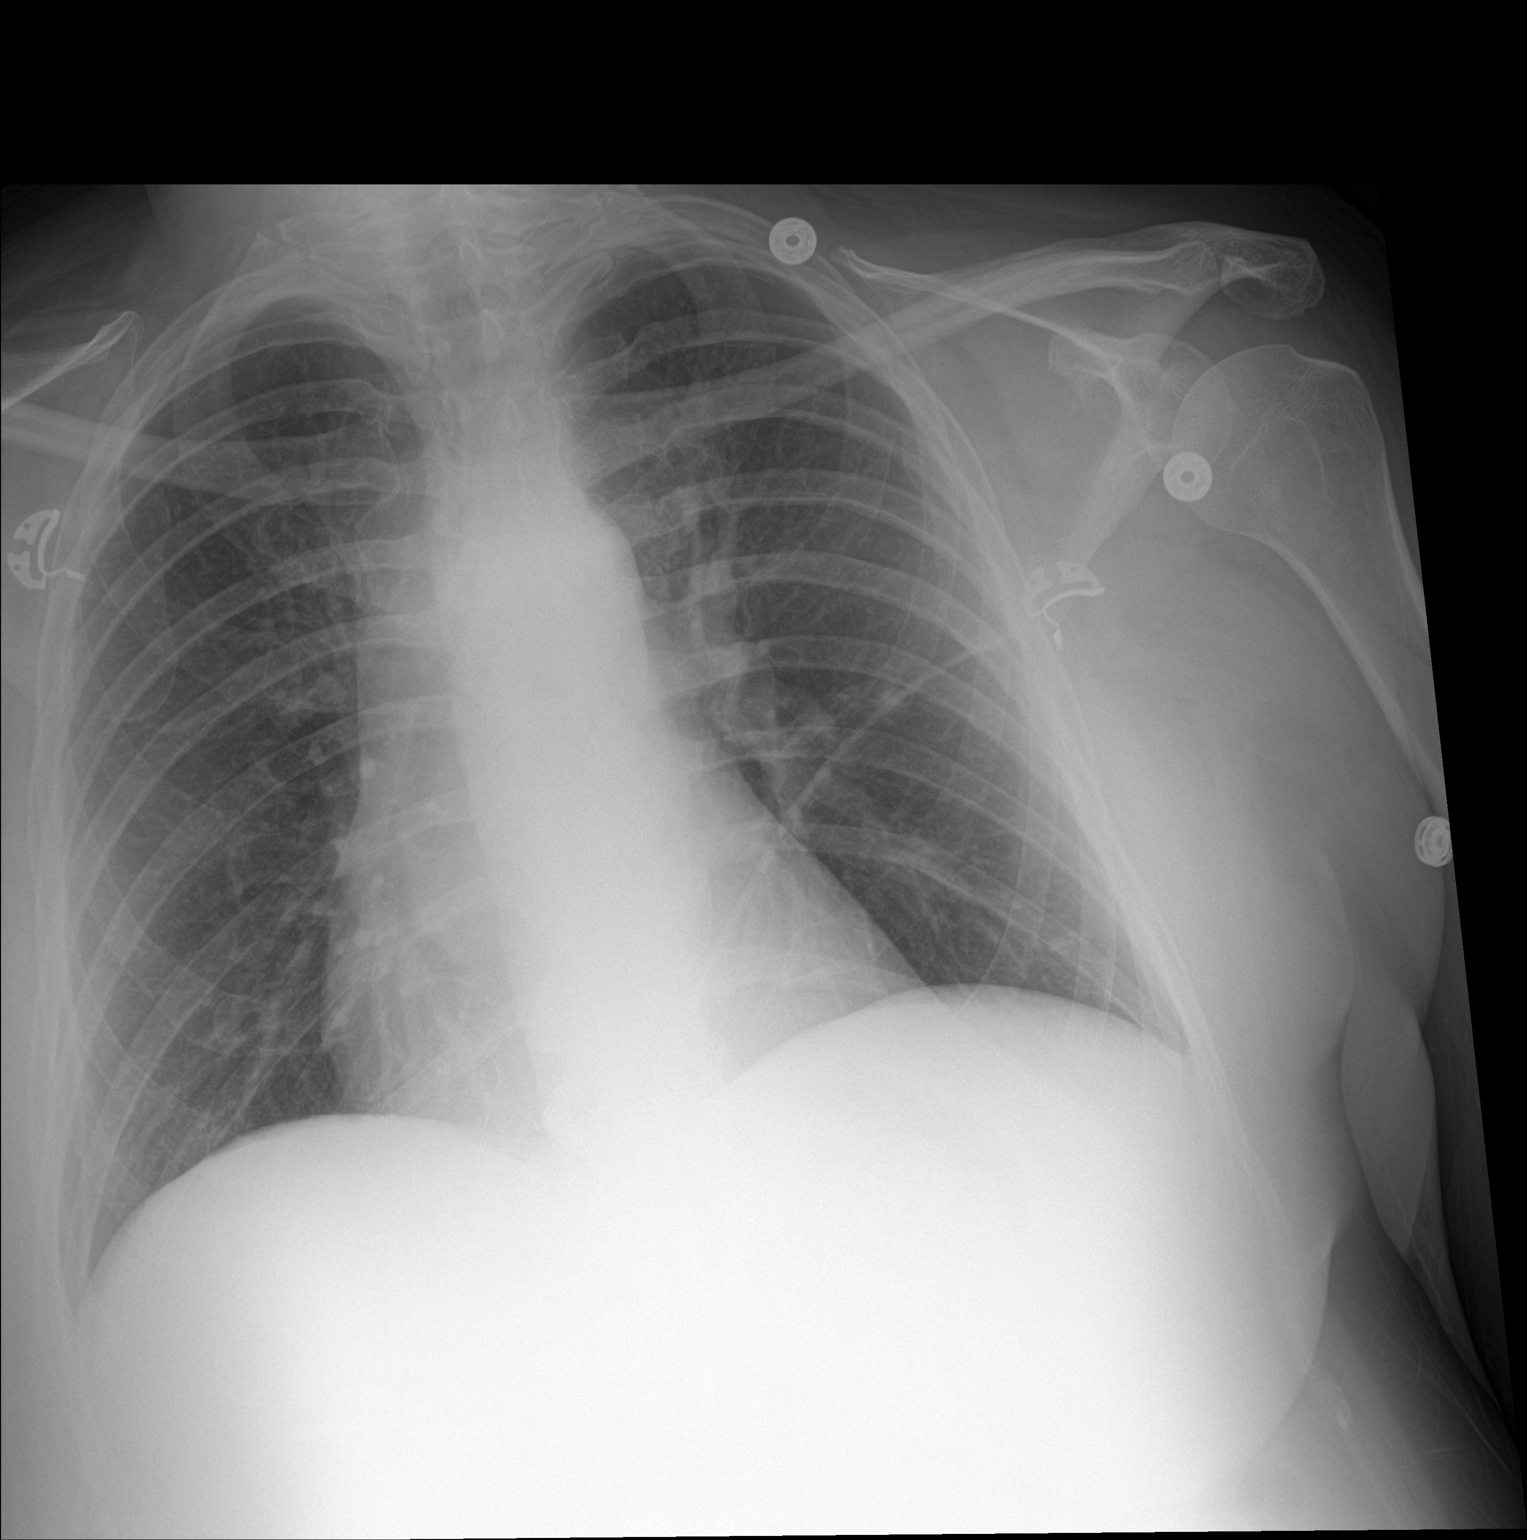

[1 of 1 positions shown; findings below may reference images not displayed]

FINDINGS: The heart size and mediastinal contours are within normal limits.
Both lungs are clear. The visualized skeletal structures are
unremarkable.
IMPRESSION: No active disease.

## 2023-02-11 ENCOUNTER — Other Ambulatory Visit (HOSPITAL_COMMUNITY): Payer: Self-pay

## 2023-02-11 DIAGNOSIS — R031 Nonspecific low blood-pressure reading: Secondary | ICD-10-CM | POA: Diagnosis not present

## 2023-02-11 DIAGNOSIS — M21371 Foot drop, right foot: Secondary | ICD-10-CM | POA: Diagnosis not present

## 2023-02-11 DIAGNOSIS — R200 Anesthesia of skin: Secondary | ICD-10-CM | POA: Diagnosis not present

## 2023-02-11 MED ORDER — METOPROLOL TARTRATE 100 MG PO TABS
100.0000 mg | ORAL_TABLET | Freq: Two times a day (BID) | ORAL | 1 refills | Status: DC
  Filled 2023-02-11: qty 180, 90d supply, fill #0

## 2023-02-11 MED ORDER — PREDNISONE 10 MG PO TABS
ORAL_TABLET | ORAL | 0 refills | Status: DC
  Filled 2023-02-11: qty 21, 10d supply, fill #0

## 2023-02-15 ENCOUNTER — Other Ambulatory Visit (HOSPITAL_COMMUNITY): Payer: Self-pay

## 2023-02-15 ENCOUNTER — Ambulatory Visit: Payer: Commercial Managed Care - PPO | Attending: Cardiovascular Disease

## 2023-02-15 DIAGNOSIS — E7800 Pure hypercholesterolemia, unspecified: Secondary | ICD-10-CM

## 2023-02-15 LAB — LIPID PANEL
Chol/HDL Ratio: 3.9 ratio (ref 0.0–4.4)
Cholesterol, Total: 144 mg/dL (ref 100–199)
HDL: 37 mg/dL — ABNORMAL LOW (ref >39)
LDL Chol Calc (NIH): 80 mg/dL (ref 0–99)
Triglycerides: 157 mg/dL — ABNORMAL HIGH (ref 0–149)
VLDL Cholesterol Cal: 27 mg/dL (ref 5–40)

## 2023-02-15 LAB — HEPATIC FUNCTION PANEL
ALT: 16 IU/L (ref 0–32)
AST: 22 IU/L (ref 0–40)
Albumin: 4.8 g/dL (ref 3.8–4.9)
Alkaline Phosphatase: 65 IU/L (ref 44–121)
Bilirubin Total: 0.6 mg/dL (ref 0.0–1.2)
Bilirubin, Direct: 0.19 mg/dL (ref 0.00–0.40)
Total Protein: 6.9 g/dL (ref 6.0–8.5)

## 2023-02-15 LAB — LDL CHOLESTEROL, DIRECT: LDL Direct: 74 mg/dL (ref 0–99)

## 2023-02-16 ENCOUNTER — Other Ambulatory Visit (HOSPITAL_COMMUNITY): Payer: Self-pay

## 2023-02-16 ENCOUNTER — Other Ambulatory Visit: Payer: Self-pay

## 2023-03-08 ENCOUNTER — Encounter: Payer: Self-pay | Admitting: Physical Therapy

## 2023-03-08 ENCOUNTER — Ambulatory Visit: Payer: Commercial Managed Care - PPO | Admitting: Physical Therapy

## 2023-03-08 ENCOUNTER — Other Ambulatory Visit: Payer: Self-pay

## 2023-03-08 VITALS — BP 132/79 | HR 74

## 2023-03-08 DIAGNOSIS — M5416 Radiculopathy, lumbar region: Secondary | ICD-10-CM | POA: Insufficient documentation

## 2023-03-08 DIAGNOSIS — R2681 Unsteadiness on feet: Secondary | ICD-10-CM | POA: Insufficient documentation

## 2023-03-08 DIAGNOSIS — M21371 Foot drop, right foot: Secondary | ICD-10-CM | POA: Insufficient documentation

## 2023-03-08 DIAGNOSIS — M6281 Muscle weakness (generalized): Secondary | ICD-10-CM | POA: Diagnosis not present

## 2023-03-08 DIAGNOSIS — R269 Unspecified abnormalities of gait and mobility: Secondary | ICD-10-CM | POA: Insufficient documentation

## 2023-03-08 NOTE — Therapy (Signed)
OUTPATIENT PHYSICAL THERAPY THORACOLUMBAR / NEURO EVALUATION   Patient Name: Alexis Campos MRN: [id number] DOB:11/23/1965, 57 y.o., female Today's Date: 03/08/2023  END OF SESSION:  PT End of Session - 03/08/23 0833     Visit Number 1    Number of Visits 7   including eval   Date for PT Re-Evaluation 05/03/23    Authorization Type [facility] [employer] Plan    PT Start Time [phone]    PT Stop Time 0930    PT Time Calculation (min) 55 min    Equipment Utilized During Treatment Gait belt    Activity Tolerance Patient tolerated treatment well    Behavior During Therapy [facility] for tasks assessed/performed             Past Medical History:  Diagnosis Date   Anemia    Anemia 05/22/2014   Blood transfusion    Diabetes mellitus    diagnosed 1996-insulin and metformin   Endometriosis    Fibromyalgia    GERD (gastroesophageal reflux disease)    History of blood transfusion 12/09   [facility]   Hyperlipidemia    Hypertension    Neuromuscular disorder (HCC)    fibromyalgia   Seasonal allergies    recent bronchitis-chest xray/ct scan-granuloma noted present since 2008   Sinusitis    Splenomegaly 05/22/2014   Past Surgical History:  Procedure Laterality Date   ABDOMINAL SURGERY     LOA for endomeriosis   APPENDECTOMY  05/1995   CESAREAN SECTION     CHOLECYSTECTOMY  01/1995   COLONOSCOPY     lysis of adhesions  1996/ 2002   TONSILLECTOMY  09/1994   Patient Active Problem List   Diagnosis Date Noted   Hyperlipidemia 11/23/2022   Diabetes mellitus type 2 with ketoacidosis (HCC) 12/27/2021   Pyelonephritis    Sepsis (HCC) 12/26/2021   Splenomegaly 05/22/2014   Anemia 05/22/2014   Obesity (BMI 30-39.9) 02/26/2014   Diabetes (HCC) 11/15/2012   Abdominal pain, right side. 10/23/2011    PCP: Kathline Magic, NP  REFERRING PROVIDER: Linus Galas, NP  REFERRING DIAG: [phone] (ICD-10-CM) - Foot drop, right foot R20.0 (ICD-10-CM) - Anesthesia of skin  Rationale for Evaluation and  Treatment: Rehabilitation  THERAPY DIAG:  Foot drop, right - Plan: PT plan of care cert/re-cert  Abnormality of gait and mobility - Plan: PT plan of care cert/re-cert  Unsteadiness on feet - Plan: PT plan of care cert/re-cert  Muscle weakness (generalized) - Plan: PT plan of care cert/re-cert  Radiculopathy, lumbar region - Plan: PT plan of care cert/re-cert  ONSET DATE: 02/16/2023 (referral date)  SUBJECTIVE:  SUBJECTIVE STATEMENT: Patient reports the onset of R hip pain that started over the last year. Patient thinks she may have some minor scoliosis. She did have an MRI which showed minor arthritic changes and possibly mild disc changes as well. Imaging not available to PT. Patient reports that since October she has lost about 64 lbs intentionally. Patient reports that since the weight change she has noticed more pain. Patient also reports increased weakness on the R side. Patient reports that she has had some numbness (not burning) on the R side. She notices decreased sensation on the R side of her leg. She cannot bring her R foot up as much. She states that she has started noticing that this ankle feels heavy. She also reports some increase pain in her R knee. She reports that this has caused her to fall about 2 times coming out of work when she was rushing due to catching her foot.She also feels like her R foot turns out some now as well. 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  PERTINENT HISTORY:  DM II on insulin, R fracture on RLE foot about 2 years ago ("not bad fracture"), L foot sprain, fibromyalgia  PAIN:  Are you having pain? None when sitting still Yes: NPRS scale: 8/10 Pain location: coming across both hips Pain description: short but  sharp Aggravating factors: working 12 hour shifts Relieving factors: heating pack, relaxing  PRECAUTIONS: Fall  RED FLAGS: Bowel or bladder incontinence: No Denies difficulty swallowing   WEIGHT BEARING RESTRICTIONS: No  FALLS:  Has patient fallen in last 6 months? Yes. Number of falls 2 falls related to RLE weakness, denies major injuries, and is able to get up from the ground with some minor difficulty  LIVING ENVIRONMENT: Lives with: lives with their family - 57 year old child Lives in: House/apartment Stairs: Yes: External: 2 steps; none Has following equipment at home: Dan Humphreys - 2 wheeled  OCCUPATION: RN working at [employer]  PLOF: Working full time, high level physical job  PATIENT GOALS: "I am hoping physical therapy can help if its a disc problem. I do not want surgery at all costs."   NEXT MD VISIT: Patient may have to go back to orthopedic pending on our findings  OBJECTIVE:   DIAGNOSTIC FINDINGS:  MRI performed up hips but not available to PT; per patient showed mild arthritic changes and some bulging of discs  PATIENT SURVEYS:  Modified Oswestry = 7/50 or 14%   SCREENING FOR RED FLAGS: Bowel or bladder incontinence: No Spinal tumors: No Cauda equina syndrome: No Compression fracture: No Abdominal aneurysm: No  COGNITION: Overall cognitive status: Within functional limits for tasks assessed     SENSATION: R lateral side from knee into L medial malleolus of ankle  POSTURE: rounded shoulders and forward head  PALPATION: Extremely tight through lumbar extensors   LUMBAR ROM:   AROM eval  Flexion WFL - pain with coming up  Extension 80% - more painful than going forward  Right lateral flexion WFL - minor pull on the R  Left lateral flexion 80% - mild pull on the R  Right rotation 50% - painfree  Left rotation 50% painfree   (Blank rows = not tested)  LOWER EXTREMITY ROM:     Decreased ankle ROM (DF and inversion on the RLE < 50% ROM)  LOWER  EXTREMITY MMT:    MMT Right eval Left eval  Hip flexion 4-/5 4/5  Hip extension    Hip abduction    Hip adduction    Hip internal rotation    Hip external rotation    Knee flexion 3+/5 4-/5  Knee extension 3+/5 4-/5  Ankle dorsiflexion 2/5 4/5  Ankle plantarflexion 3/5 4/5  Ankle inversion 4/5   Ankle eversion 2/5    (Blank rows = not tested)  LUMBAR SPECIAL TESTS:  Slump test: no major change with distal component  FUNCTIONAL TESTS:     [facility] PT Assessment - 03/08/23 0001       Standardized Balance Assessment   Standardized Balance Assessment Five Times Sit to Stand    Five times sit to stand comments  17.77   seconds without UE use (reports mild increas in numbness)            GAIT: Distance walked: 2 x 60 feet  Assistive device utilized: None Level of assistance: Modified independence Comments: R foot drop, mild decreased trunk rotation, decreased hip/knee flexion  TODAY'S TREATMENT:                                                                                                                               TherEx: Prone lying with RLE off mat with goal of 10 minutes (discussed when to stop) Also introduced foot up brace but did not trial in today's session for patient to conisder trialing in the future  PATIENT EDUCATION:  Education details: POC, examination findings, goal collaboration Person educated: Patient Education method: Explanation Education comprehension: verbalized understanding and needs further education  HOME EXERCISE PROGRAM: Access Code: [email or web address] URL: [email or web address] Date: 03/08/2023 Prepared by: Maryruth Eve  Exercises - Lying Prone with 1 Pillow  - 1 x daily - 7 x weekly - 2 sets - 5-10 minutes hold  ASSESSMENT:  CLINICAL IMPRESSION: Patient is a 57 y.o. female who was seen today for physical therapy evaluation and treatment for R foot drop with symptoms consistent with possible lumbar radiculopathy of L4-5  region though origin is unknown at this time with decreased dermatome/myotome findings in these areas. Patient presents with increased risk for falls as indicated by 5xSTS score and mild levels of impairment as indicated by modified Oswestry. Direction preference unclear at this time given patient's extent of muscle tightness. Patient will benefit from skilled physical therapy services to address impairments in order to reduce weakness, improve strength, balance, and safety and progress towards discharge.   OBJECTIVE IMPAIRMENTS: Abnormal gait, decreased balance, difficulty walking, decreased ROM, decreased strength, increased muscle spasms, and pain.   ACTIVITY LIMITATIONS: bending, sitting, transfers, and locomotion level  PARTICIPATION LIMITATIONS: cleaning, community activity, and occupation  PERSONAL FACTORS: Profession, Time since onset of injury/illness/exacerbation, and 1-2 comorbidities: see above  are also affecting patient's functional outcome.   REHAB POTENTIAL: Good  CLINICAL DECISION MAKING: Stable/uncomplicated  EVALUATION COMPLEXITY: Low   GOALS: Goals reviewed with patient? Yes  SHORT TERM GOALS: Target date: 04/05/2023  Patient will demonstrate 100% compliance  with initial HEP to continue to progress between physical therapy sessions.   Baseline: To be provided Goal status: INITIAL  2.  mCTSIB to assessed/ LTG goal written Baseline: To be assessed Goal status: INITIAL  LONG TERM GOALS: Target date: 04/19/2023  Patient will report demonstrate independence with final HEP in order to maintain current gains and continue to progress after physical therapy discharge.   Baseline: To be assessed Goal status: INITIAL  2.  Patient will improve modified ODI score to < 8% impairment indicate a clinically important improvement in low back pain.   Baseline: 14% Impairment Goal status: INITIAL  3.  Patient will improve their 5x Sit to Stand score to less than 14 seconds to  demonstrate a decreased risk for falls and improved LE strength.   Baseline: 17.77 seconds without UE use Goal status: INITIAL  4.  mCTSIB to be assessed/ goal written as indicated Baseline: To be assessed Goal status: INITIAL  5.  Therapist will trial foot up brace / other orthotic brace as indicated to improve safety with ambulation and LE clearance.  Baseline: To be  Goal status: INITIAL  PLAN:  PT FREQUENCY: 1x/week  PT DURATION: 6 weeks  PLANNED INTERVENTIONS: Therapeutic exercises, Therapeutic activity, Neuromuscular re-education, Balance training, Gait training, Patient/Family education, Self Care, Joint mobilization, Dry Needling, Manual therapy, and Re-evaluation.  PLAN FOR NEXT SESSION: assess mCTSIB and write goal as indicated, dry needling, update HEP (how is prone lying going), foot up brace trial or other AFO possibly  Carmelia Bake, PT, DPT 03/08/2023, 1:11 PM

## 2023-03-11 DIAGNOSIS — E038 Other specified hypothyroidism: Secondary | ICD-10-CM | POA: Diagnosis not present

## 2023-03-11 DIAGNOSIS — Z9641 Presence of insulin pump (external) (internal): Secondary | ICD-10-CM | POA: Diagnosis not present

## 2023-03-11 DIAGNOSIS — E1165 Type 2 diabetes mellitus with hyperglycemia: Secondary | ICD-10-CM | POA: Diagnosis not present

## 2023-03-11 DIAGNOSIS — I10 Essential (primary) hypertension: Secondary | ICD-10-CM | POA: Diagnosis not present

## 2023-03-11 DIAGNOSIS — E559 Vitamin D deficiency, unspecified: Secondary | ICD-10-CM | POA: Diagnosis not present

## 2023-03-12 ENCOUNTER — Other Ambulatory Visit (HOSPITAL_COMMUNITY): Payer: Self-pay

## 2023-03-12 MED ORDER — HUMALOG KWIKPEN 200 UNIT/ML ~~LOC~~ SOPN
5.0000 [IU] | PEN_INJECTOR | Freq: Three times a day (TID) | SUBCUTANEOUS | 5 refills | Status: DC
  Filled 2023-03-12: qty 12, 28d supply, fill #0
  Filled 2023-04-21: qty 12, 28d supply, fill #1
  Filled 2023-12-22: qty 12, 28d supply, fill #2
  Filled 2024-02-08: qty 12, 28d supply, fill #3
  Filled 2024-03-08: qty 12, 28d supply, fill #4

## 2023-03-12 MED ORDER — INSULIN PEN NEEDLE 31G X 8 MM MISC
Freq: Every day | 3 refills | Status: AC
  Filled 2023-03-12: qty 400, 80d supply, fill #0
  Filled 2023-09-06 (×2): qty 400, 80d supply, fill #1
  Filled 2024-02-08: qty 400, 80d supply, fill #2

## 2023-03-12 MED ORDER — INSULIN GLARGINE-YFGN 100 UNIT/ML ~~LOC~~ SOPN
70.0000 [IU] | PEN_INJECTOR | Freq: Every day | SUBCUTANEOUS | 5 refills | Status: AC
  Filled 2023-03-12: qty 30, 42d supply, fill #0
  Filled 2023-04-21: qty 30, 42d supply, fill #1
  Filled 2024-02-08: qty 30, 42d supply, fill #2

## 2023-03-15 ENCOUNTER — Other Ambulatory Visit (HOSPITAL_COMMUNITY): Payer: Self-pay

## 2023-03-15 MED ORDER — MOUNJARO 10 MG/0.5ML ~~LOC~~ SOAJ
10.0000 mg | SUBCUTANEOUS | 5 refills | Status: DC
  Filled 2023-03-15: qty 2, 28d supply, fill #0
  Filled 2023-04-21: qty 2, 28d supply, fill #1
  Filled 2023-05-25: qty 2, 28d supply, fill #2

## 2023-03-16 ENCOUNTER — Ambulatory Visit: Payer: Commercial Managed Care - PPO | Admitting: Physical Therapy

## 2023-03-16 ENCOUNTER — Other Ambulatory Visit (HOSPITAL_COMMUNITY): Payer: Self-pay

## 2023-03-16 DIAGNOSIS — M5416 Radiculopathy, lumbar region: Secondary | ICD-10-CM | POA: Diagnosis not present

## 2023-03-16 DIAGNOSIS — R269 Unspecified abnormalities of gait and mobility: Secondary | ICD-10-CM | POA: Diagnosis not present

## 2023-03-16 DIAGNOSIS — M6281 Muscle weakness (generalized): Secondary | ICD-10-CM

## 2023-03-16 DIAGNOSIS — M21371 Foot drop, right foot: Secondary | ICD-10-CM | POA: Diagnosis not present

## 2023-03-16 DIAGNOSIS — R2681 Unsteadiness on feet: Secondary | ICD-10-CM

## 2023-03-16 NOTE — Therapy (Signed)
OUTPATIENT PHYSICAL THERAPY THORACOLUMBAR / NEURO TREATMENT   Patient Name: Alexis Campos MRN: [id number] DOB:18-Aug-1965, 57 y.o., female Today's Date: 03/16/2023  END OF SESSION:  PT End of Session - 03/16/23 1622     Visit Number 2    Number of Visits 7   including eval   Date for PT Re-Evaluation 05/03/23    Authorization Type [facility] [employer] Plan    PT Start Time 1620    PT Stop Time 1700    PT Time Calculation (min) 40 min    Equipment Utilized During Treatment Gait belt    Activity Tolerance Patient tolerated treatment well    Behavior During Therapy WFL for tasks assessed/performed              Past Medical History:  Diagnosis Date   Anemia    Anemia 05/22/2014   Blood transfusion    Diabetes mellitus    diagnosed 1996-insulin and metformin   Endometriosis    Fibromyalgia    GERD (gastroesophageal reflux disease)    History of blood transfusion 12/09   [facility]   Hyperlipidemia    Hypertension    Neuromuscular disorder (HCC)    fibromyalgia   Seasonal allergies    recent bronchitis-chest xray/ct scan-granuloma noted present since 2008   Sinusitis    Splenomegaly 05/22/2014   Past Surgical History:  Procedure Laterality Date   ABDOMINAL SURGERY     LOA for endomeriosis   APPENDECTOMY  05/1995   CESAREAN SECTION     CHOLECYSTECTOMY  01/1995   COLONOSCOPY     lysis of adhesions  1996/ 2002   TONSILLECTOMY  09/1994   Patient Active Problem List   Diagnosis Date Noted   Hyperlipidemia 11/23/2022   Diabetes mellitus type 2 with ketoacidosis (HCC) 12/27/2021   Pyelonephritis    Sepsis (HCC) 12/26/2021   Splenomegaly 05/22/2014   Anemia 05/22/2014   Obesity (BMI 30-39.9) 02/26/2014   Diabetes (HCC) 11/15/2012   Abdominal pain, right side. 10/23/2011    PCP: Kathline Magic, NP  REFERRING PROVIDER: Linus Galas, NP  REFERRING DIAG: [phone] (ICD-10-CM) - Foot drop, right foot R20.0 (ICD-10-CM) - Anesthesia of skin  Rationale for Evaluation and  Treatment: Rehabilitation  THERAPY DIAG:  Abnormality of gait and mobility  Unsteadiness on feet  Muscle weakness (generalized)  Radiculopathy, lumbar region  Foot drop, right  ONSET DATE: 02/16/2023 (referral date)  SUBJECTIVE:                                                                                                                                                                                           SUBJECTIVE STATEMENT:  Pt reports that her symptoms have not been as bad since she has had a week off of work, pt tends to notice symptoms pretty soon into her shift when she does work. Pt reports that the numbness has been better in her distal RLE, not as bad as it normally is, but usually by the end of her shift she can't lift her R foot up (foot drop). Pt also reports some soreness across her low back, pain never shoots down her leg. Pt just has numbness down her R leg and foot drop and foot heaviness.  Pt still waiting on MRI results from December, thought she might need injections.  PERTINENT HISTORY:  DM II on insulin, R fracture on RLE foot about 2 years ago ("not bad fracture"), L foot sprain, fibromyalgia  PAIN:  Are you having pain? None when sitting still Yes: NPRS scale: 8/10 Pain location: coming across both hips Pain description: short but sharp Aggravating factors: working 12 hour shifts Relieving factors: heating pack, relaxing  PRECAUTIONS: Fall  RED FLAGS: Bowel or bladder incontinence: No Denies difficulty swallowing   WEIGHT BEARING RESTRICTIONS: No  FALLS:  Has patient fallen in last 6 months? Yes. Number of falls 2 falls related to RLE weakness, denies major injuries, and is able to get up from the ground with some minor difficulty  LIVING ENVIRONMENT: Lives with: lives with their family - 49 year old child Lives in: House/apartment Stairs: Yes: External: 2 steps; none Has following equipment at home: Dan Humphreys - 2 wheeled  OCCUPATION: RN  working at [employer]  PLOF: Working full time, high level physical job  PATIENT GOALS: "I am hoping physical therapy can help if its a disc problem. I do not want surgery at all costs."   NEXT MD VISIT: Patient may have to go back to orthopedic pending on our findings  OBJECTIVE:   DIAGNOSTIC FINDINGS:  MRI performed up hips but not available to PT; per patient showed mild arthritic changes and some bulging of discs  PATIENT SURVEYS:  Modified Oswestry = 7/50 or 14%   SCREENING FOR RED FLAGS: Bowel or bladder incontinence: No Spinal tumors: No Cauda equina syndrome: No Compression fracture: No Abdominal aneurysm: No  COGNITION: Overall cognitive status: Within functional limits for tasks assessed     SENSATION: R lateral side from knee into L medial malleolus of ankle  POSTURE: rounded shoulders and forward head  PALPATION: Extremely tight through lumbar extensors   LUMBAR ROM:   AROM eval  Flexion WFL - pain with coming up  Extension 80% - more painful than going forward  Right lateral flexion WFL - minor pull on the R  Left lateral flexion 80% - mild pull on the R  Right rotation 50% - painfree  Left rotation 50% painfree   (Blank rows = not tested)  LOWER EXTREMITY ROM:     Decreased ankle ROM (DF and inversion on the RLE < 50% ROM)  LOWER EXTREMITY MMT:    MMT Right eval Left eval  Hip flexion 4-/5 4/5  Hip extension    Hip abduction    Hip adduction    Hip internal rotation    Hip external rotation    Knee flexion 3+/5 4-/5  Knee extension 3+/5 4-/5  Ankle dorsiflexion 2/5 4/5  Ankle plantarflexion 3/5 4/5  Ankle inversion 4/5   Ankle eversion 2/5    (Blank rows = not tested)  LUMBAR SPECIAL TESTS:  Slump test: no major change with distal component  TODAY'S TREATMENT:                                                                                                                               TherEx: Supine mat level therex for stretching and  strengthening of core muscles: LTR x 10 reps each direction Posterior pelvic tilt x 5 reps with 5 sec hold Bridges x 5 reps with 5 sec hold  Added to HEP, see bolded below  TherAct mCTSIB:   Condition 1: 30 sec  Condition 2: 30 sec  Condition 3: 30 sec  Condition 4: 30 sec but very wobbly/unsteady  Trigger Point Dry-Needling  Treatment instructions: Expect mild to moderate muscle soreness. S/S of pneumothorax if dry needled over a lung field, and to seek immediate medical attention should they occur. Patient verbalized understanding of these instructions and education.  Patient Consent Given: Yes Education handout provided: Yes Muscles treated: L and R lumbar iliocostalis and multifidus Treatment response/outcome: twitch response detected; minor discomfort during treatment and mild soreness following treatment   Trigger Point Dry Needling  What is Trigger Point Dry Needling (DN)? DN is a physical therapy technique used to treat muscle pain and dysfunction. Specifically, DN helps deactivate muscle trigger points (muscle knots).  A thin filiform needle is used to penetrate the skin and stimulate the underlying trigger point. The goal is for a local twitch response (LTR) to occur and for the trigger point to relax. No medication of any kind is injected during the procedure.   What Does Trigger Point Dry Needling Feel Like?  The procedure feels different for each individual patient. Some patients report that they do not actually feel the needle enter the skin and overall the process is not painful. Very mild bleeding may occur. However, many patients feel a deep cramping in the muscle in which the needle was inserted. This is the local twitch response.   How Will I feel after the treatment? Soreness is normal, and the onset of soreness may not occur for a few hours. Typically this soreness does not last longer than two days.  Bruising is uncommon, however; ice can be used to decrease any  possible bruising.  In rare cases feeling tired or nauseous after the treatment is normal. In addition, your symptoms may get worse before they get better, this period will typically not last longer than 24 hours.   What Can I do After My Treatment? Increase your hydration by drinking more water for the next 24 hours. You may place ice or heat on the areas treated that have become sore, however, do not use heat on inflamed or bruised areas. Heat often brings more relief post needling. You can continue your regular activities, but vigorous activity is not recommended initially after the treatment for 24 hours. DN is best combined with other physical therapy such as strengthening, stretching, and other therapies.     PATIENT EDUCATION:  Education details: TPDN (see above),  added to HEP Person educated: Patient Education method: Explanation, Demonstration, and Handouts Education comprehension: verbalized understanding, returned demonstration, and needs further education  HOME EXERCISE PROGRAM: Access Code: [email or web address] URL: [email or web address] Date: 03/08/2023 Prepared by: Maryruth Eve  Exercises - Lying Prone with 1 Pillow  - 1 x daily - 7 x weekly - 2 sets - 5-10 minutes hold - Supine Lower Trunk Rotation  - 1 x daily - 7 x weekly - 3 sets - 10 reps - Supine Posterior Pelvic Tilt  - 1 x daily - 7 x weekly - 3 sets - 10 reps - 5 sec hold - Supine Bridge  - 1 x daily - 7 x weekly - 3 sets - 5 reps - 5 sec hold hold  ASSESSMENT:  CLINICAL IMPRESSION: Emphasis of skilled PT session on initiating TPDN and adding to HEP to address pain/tightness and weakness in lumbar musculature. Pt with mild soreness following TPDN this date and with twitch response detected. Pt not in a flare-up of pain right now as she has had a week off of work, may benefit from a future session of TPDN when her pain is increased. Pt does well with addition of core stretching and strengthening exercises this  session, can benefit from addition of further exercises to HEP in future sessions. Additionally, pt scores high on mCTSIB with balance impairments only noted on Condition 4 though she is able to hold position x 30 sec. Continue POC.   OBJECTIVE IMPAIRMENTS: Abnormal gait, decreased balance, difficulty walking, decreased ROM, decreased strength, increased muscle spasms, and pain.   ACTIVITY LIMITATIONS: bending, sitting, transfers, and locomotion level  PARTICIPATION LIMITATIONS: cleaning, community activity, and occupation  PERSONAL FACTORS: Profession, Time since onset of injury/illness/exacerbation, and 1-2 comorbidities: see above  are also affecting patient's functional outcome.   REHAB POTENTIAL: Good  CLINICAL DECISION MAKING: Stable/uncomplicated  EVALUATION COMPLEXITY: Low   GOALS: Goals reviewed with patient? Yes  SHORT TERM GOALS: Target date: 04/05/2023  Patient will demonstrate 100% compliance with initial HEP to continue to progress between physical therapy sessions.   Baseline: To be provided Goal status: INITIAL  2.  mCTSIB to assessed/ LTG goal written Baseline: 30 sec on all conditions (7/30) Goal status: GOAL MET  LONG TERM GOALS: Target date: 04/19/2023  Patient will report demonstrate independence with final HEP in order to maintain current gains and continue to progress after physical therapy discharge.   Baseline: To be assessed Goal status: INITIAL  2.  Patient will improve modified ODI score to < 8% impairment indicate a clinically important improvement in low back pain.   Baseline: 14% Impairment Goal status: INITIAL  3.  Patient will improve their 5x Sit to Stand score to less than 14 seconds to demonstrate a decreased risk for falls and improved LE strength.   Baseline: 17.77 seconds without UE use Goal status: INITIAL  4.  mCTSIB to be assessed/ goal written as indicated Baseline: 30 sec on all conditions (7/30) Goal status: GOAL D/C DUE TO PT  SCORE  5.  Therapist will trial foot up brace / other orthotic brace as indicated to improve safety with ambulation and LE clearance.  Baseline: To be  Goal status: INITIAL  PLAN:  PT FREQUENCY: 1x/week  PT DURATION: 6 weeks  PLANNED INTERVENTIONS: Therapeutic exercises, Therapeutic activity, Neuromuscular re-education, Balance training, Gait training, Patient/Family education, Self Care, Joint mobilization, Dry Needling, Manual therapy, and Re-evaluation.  PLAN FOR NEXT SESSION: dry needling, update HEP PRN (child's pose vs anterior leans on  therapy ball, progression of supine core strengthening-marches, dead bugs, etc), if pt wears tennis shoes trial foot up brace trial or other AFO possibly for RLE  Peter Congo, PT, DPT, CSRS  03/16/2023, 5:14 PM

## 2023-03-23 ENCOUNTER — Other Ambulatory Visit (HOSPITAL_COMMUNITY): Payer: Self-pay

## 2023-03-23 ENCOUNTER — Ambulatory Visit: Payer: Commercial Managed Care - PPO | Admitting: Physical Therapy

## 2023-03-23 DIAGNOSIS — R109 Unspecified abdominal pain: Secondary | ICD-10-CM | POA: Diagnosis not present

## 2023-03-23 DIAGNOSIS — Z87442 Personal history of urinary calculi: Secondary | ICD-10-CM | POA: Diagnosis not present

## 2023-03-23 DIAGNOSIS — R300 Dysuria: Secondary | ICD-10-CM | POA: Diagnosis not present

## 2023-03-23 DIAGNOSIS — R110 Nausea: Secondary | ICD-10-CM | POA: Diagnosis not present

## 2023-03-23 MED ORDER — CIPROFLOXACIN HCL 500 MG PO TABS
500.0000 mg | ORAL_TABLET | Freq: Two times a day (BID) | ORAL | 0 refills | Status: DC
  Filled 2023-03-23: qty 14, 7d supply, fill #0

## 2023-03-23 MED ORDER — ONDANSETRON HCL 4 MG PO TABS
4.0000 mg | ORAL_TABLET | Freq: Two times a day (BID) | ORAL | 0 refills | Status: AC | PRN
  Filled 2023-03-23: qty 30, 15d supply, fill #0

## 2023-03-23 MED ORDER — CYCLOBENZAPRINE HCL 5 MG PO TABS
5.0000 mg | ORAL_TABLET | Freq: Every day | ORAL | 1 refills | Status: DC | PRN
  Filled 2023-03-23: qty 60, 30d supply, fill #0

## 2023-03-30 ENCOUNTER — Ambulatory Visit: Payer: Commercial Managed Care - PPO | Admitting: Physical Therapy

## 2023-03-30 ENCOUNTER — Encounter: Payer: Self-pay | Admitting: Physical Therapy

## 2023-03-30 VITALS — BP 118/71 | HR 78

## 2023-03-30 DIAGNOSIS — M6281 Muscle weakness (generalized): Secondary | ICD-10-CM | POA: Insufficient documentation

## 2023-03-30 DIAGNOSIS — R2681 Unsteadiness on feet: Secondary | ICD-10-CM | POA: Diagnosis not present

## 2023-03-30 DIAGNOSIS — R269 Unspecified abnormalities of gait and mobility: Secondary | ICD-10-CM | POA: Diagnosis not present

## 2023-03-30 DIAGNOSIS — M5416 Radiculopathy, lumbar region: Secondary | ICD-10-CM | POA: Diagnosis not present

## 2023-03-30 DIAGNOSIS — M21371 Foot drop, right foot: Secondary | ICD-10-CM | POA: Insufficient documentation

## 2023-03-30 NOTE — Therapy (Unsigned)
OUTPATIENT PHYSICAL THERAPY THORACOLUMBAR / NEURO TREATMENT   Patient Name: Alexis Campos MRN: [id number] DOB:1965-09-06, 57 y.o., female Today's Date: 04/01/2023  END OF SESSION:    03/30/23 1235  PT Visits / Re-Eval  Visit Number 3  Number of Visits 7  Date for PT Re-Evaluation 05/03/23  Authorization  Authorization Type [place] [employer] Plan  PT Time Calculation  PT Start Time 1230  PT Stop Time 1310  PT Time Calculation (min) 40 min  PT - End of Session  Equipment Utilized During Treatment Gait belt  Activity Tolerance Patient tolerated treatment well  Behavior During Therapy WFL for tasks assessed/performed     Past Medical History:  Diagnosis Date   Anemia    Anemia 05/22/2014   Blood transfusion    Diabetes mellitus    diagnosed 1996-insulin and metformin   Endometriosis    Fibromyalgia    GERD (gastroesophageal reflux disease)    History of blood transfusion 12/09   [facility]   Hyperlipidemia    Hypertension    Neuromuscular disorder (HCC)    fibromyalgia   Seasonal allergies    recent bronchitis-chest xray/ct scan-granuloma noted present since 2008   Sinusitis    Splenomegaly 05/22/2014   Past Surgical History:  Procedure Laterality Date   ABDOMINAL SURGERY     LOA for endomeriosis   APPENDECTOMY  05/1995   CESAREAN SECTION     CHOLECYSTECTOMY  01/1995   COLONOSCOPY     lysis of adhesions  1996/ 2002   TONSILLECTOMY  09/1994   Patient Active Problem List   Diagnosis Date Noted   Hyperlipidemia 11/23/2022   Diabetes mellitus type 2 with ketoacidosis (HCC) 12/27/2021   Pyelonephritis    Sepsis (HCC) 12/26/2021   Splenomegaly 05/22/2014   Anemia 05/22/2014   Obesity (BMI 30-39.9) 02/26/2014   Diabetes (HCC) 11/15/2012   Abdominal pain, right side. 10/23/2011    PCP: Kathline Magic, NP  REFERRING PROVIDER: Linus Galas, NP  REFERRING DIAG: [phone] (ICD-10-CM) - Foot drop, right foot R20.0 (ICD-10-CM) - Anesthesia of skin  Rationale for  Evaluation and Treatment: Rehabilitation  THERAPY DIAG:  Abnormality of gait and mobility  Unsteadiness on feet  Muscle weakness (generalized)  Radiculopathy, lumbar region  Foot drop, right  ONSET DATE: 02/16/2023 (referral date)  SUBJECTIVE:                                                                                                                                                                                           SUBJECTIVE STATEMENT: Pt reports that she missed last week due to a kidney infection. She reports that her balance has been  more off this last week but denies falls otherwise. Still waiting on MRI results. Patient reports that dry needling was very helpful. Patient also reports some improvement with stomach exercises.   Pt still waiting on MRI results from December, thought she might need injections.  PERTINENT HISTORY:  DM II on insulin, R fracture on RLE foot about 2 years ago ("not bad fracture"), L foot sprain, fibromyalgia  PAIN:  Are you having pain? None when sitting still Yes: NPRS scale: 4/10 Pain location: coming across both hips Pain description: short but sharp Aggravating factors: working 12 hour shifts Relieving factors: heating pack, relaxing  PRECAUTIONS: Fall  RED FLAGS: Bowel or bladder incontinence: No Denies difficulty swallowing   WEIGHT BEARING RESTRICTIONS: No  FALLS:  Has patient fallen in last 6 months? Yes. 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 I do not want surgery at all  costs."   NEXT MD VISIT: Patient may have to go back to orthopedic pending on our findings  OBJECTIVE:   DIAGNOSTIC FINDINGS:  MRI performed up hips but not available to PT; per patient showed mild arthritic changes and some bulging of discs  PATIENT SURVEYS:  Modified Oswestry = 7/50 or 14%   SCREENING FOR RED FLAGS: Bowel or bladder incontinence: No Spinal tumors: No Cauda equina syndrome: No Compression fracture: No Abdominal aneurysm: No  COGNITION: Overall cognitive status: Within functional limits for tasks assessed     SENSATION: R lateral side from knee into L medial malleolus of ankle  POSTURE: rounded shoulders and forward head  PALPATION: Extremely tight through lumbar extensors   LUMBAR ROM:   AROM eval  Flexion WFL - pain with coming up  Extension 80% - more painful than going forward  Right lateral flexion WFL - minor pull on the R  Left lateral flexion 80% - mild pull on the R  Right rotation 50% - painfree  Left rotation 50% painfree   (Blank rows = not tested)  LOWER EXTREMITY ROM:     Decreased ankle ROM (DF and inversion on the RLE < 50% ROM)  LOWER EXTREMITY MMT:    MMT Right eval Left eval  Hip flexion 4-/5 4/5  Hip extension    Hip abduction    Hip adduction    Hip internal rotation    Hip external rotation    Knee flexion 3+/5 4-/5  Knee extension 3+/5 4-/5  Ankle dorsiflexion 2/5 4/5  Ankle plantarflexion 3/5 4/5  Ankle inversion 4/5   Ankle eversion 2/5    (Blank rows = not tested)  LUMBAR SPECIAL TESTS:  Slump test: no major change with distal component   TODAY'S TREATMENT:  Vitals:   03/30/23 1242  BP: 118/71  Pulse: 78    AFO Trials:  GAIT:  Gait pattern: step through pattern and poor foot clearance- Right and R foot drop Distance walked: 2 x 80 feet Assistive device utilized:  None Level of assistance: Modified independence Comments: Ambulates without AD, right foot drop noted  GAIT:  Gait pattern: step through pattern, mild supination of foot Distance walked: 1 x 230 feet Assistive device utilized: Foot Up brace R Level of assistance: Modified independence Comments: Improved heel strike on RLE, decreased medial lateral/knee support noted  GAIT:  Gait pattern: step through pattern, improved ankle stability  Distance walked: 1 x 345 feet Assistive device utilized: R Thus[employer] Townsend Posterior Lateral Strut Level of assistance: Modified independence Comments: Greatest level of control and stability noted, appropriate device, patient liked use of device and noted feeling more secure and improved endurance  GAIT:  Gait pattern: step through pattern; decreased ankle stability  Distance walked: 1 x 50 feet Assistive device utilized: R [employer] Posterior Medial Strut Level of assistance: Modified independence Comments: Increased ankle instability noted as pushed patient into more supination; would not advise TherAct:  Provided education on what each of the braces assist with and process of acquiring an AFO; patient wants to begin process of aquiring one as she thinks it will help her carry out her duties at work more effectively   PATIENT EDUCATION:  Education details: AFO info + continue HEP as noted above Person educated: Patient Education method: Programmer, multimedia, Demonstration, and Handouts Education comprehension: verbalized understanding, returned demonstration, and needs further education  HOME EXERCISE PROGRAM: Access Code: [email or web address] URL: [email or web address] Date: 03/08/2023 Prepared by: Maryruth Eve  Exercises - Lying Prone with 1 Pillow  - 1 x daily - 7 x weekly - 2 sets - 5-10 minutes hold - Supine Lower Trunk Rotation  - 1 x daily - 7 x weekly - 3 sets - 10 reps - Supine Posterior Pelvic Tilt  - 1 x daily - 7 x weekly - 3 sets - 10  reps - 5 sec hold - Supine Bridge  - 1 x daily - 7 x weekly - 3 sets - 5 reps - 5 sec hold hold  ASSESSMENT:  CLINICAL IMPRESSION: Emphasis of skilled PT session on AFO and foot bracing trials. While patient foot clearance improved with foot up brace could not provide adequate medial/lateral control of ankle. With further trials normalized gait pattern noted with use of [employer] R Posterior Lateral Strut. Therapist will begin process of reaching out to patient's PCP to start process of acquiring brace as will provide patient increased safety/endurance to carry out work duties and other ADLs. Patinet in agreement. Continue POC.   OBJECTIVE IMPAIRMENTS: Abnormal gait, decreased balance, difficulty walking, decreased ROM, decreased strength, increased muscle spasms, and pain.   ACTIVITY LIMITATIONS: bending, sitting, transfers, and locomotion level  PARTICIPATION LIMITATIONS: cleaning, community activity, and occupation  PERSONAL FACTORS: Profession, Time since onset of injury/illness/exacerbation, and 1-2 comorbidities: see above  are also affecting patient's functional outcome.   REHAB POTENTIAL: Good  CLINICAL DECISION MAKING: Stable/uncomplicated  EVALUATION COMPLEXITY: Low   GOALS: Goals reviewed with patient? Yes  SHORT TERM GOALS: Target date: 04/05/2023  Patient will demonstrate 100% compliance with initial HEP to continue to progress between physical therapy sessions.   Baseline: To be provided Goal status: INITIAL  2.  mCTSIB to assessed/ LTG goal written Baseline: 30 sec on all conditions (7/30) Goal status: GOAL MET  LONG TERM  GOALS: Target date: 04/19/2023  Patient will report demonstrate independence with final HEP in order to maintain current gains and continue to progress after physical therapy discharge.   Baseline: To be assessed Goal status: INITIAL  2.  Patient will improve modified ODI score to < 8% impairment indicate a clinically important  improvement in low back pain.   Baseline: 14% Impairment Goal status: INITIAL  3.  Patient will improve their 5x Sit to Stand score to less than 14 seconds to demonstrate a decreased risk for falls and improved LE strength.   Baseline: 17.77 seconds without UE use Goal status: INITIAL  4.  mCTSIB to be assessed/ goal written as indicated Baseline: 30 sec on all conditions (7/30) Goal status: GOAL D/C DUE TO PT SCORE  5.  Therapist will trial foot up brace / other orthotic brace as indicated to improve safety with ambulation and LE clearance.  Baseline: To be  Goal status: INITIAL  PLAN:  PT FREQUENCY: 1x/week  PT DURATION: 6 weeks  PLANNED INTERVENTIONS: Therapeutic exercises, Therapeutic activity, Neuromuscular re-education, Balance training, Gait training, Patient/Family education, Self Care, Joint mobilization, Dry Needling, Manual therapy, and Re-evaluation.  PLAN FOR NEXT SESSION: dry needling, update HEP PRN (child's pose vs anterior leans on therapy ball, progression of supine core strengthening-marches, dead bugs, etc), townsend brace update?; add more dry needling appointments (8/20)   Maryruth Eve, PT, DPT 04/01/2023, 8:24 AM

## 2023-04-01 ENCOUNTER — Telehealth: Payer: Self-pay | Admitting: Physical Therapy

## 2023-04-01 NOTE — Telephone Encounter (Signed)
To the attention of Kathline Magic, NP   Good Afternoon,  Alexis Campos  was evaluated by myself for physical therapy on 04/01/2023 for the appropriateness of bracing to assist with R foot drop.  The patient would benefit from an order and provider note recommending right AFO with lateral posterior strut. This can be added as an addendum to previous recent note or when you see her later this month. If you agree, please place an order and fax medical note to [phone].  Thank you, Maryruth Eve, PT, DPT  [facility] [address] [place], [place]  [address] Phone:  [phone] Fax:  [phone]

## 2023-04-06 ENCOUNTER — Encounter: Payer: Self-pay | Admitting: Physical Therapy

## 2023-04-06 ENCOUNTER — Ambulatory Visit: Payer: Commercial Managed Care - PPO | Admitting: Physical Therapy

## 2023-04-06 VITALS — BP 109/74 | HR 75

## 2023-04-06 DIAGNOSIS — R269 Unspecified abnormalities of gait and mobility: Secondary | ICD-10-CM | POA: Diagnosis not present

## 2023-04-06 DIAGNOSIS — M6281 Muscle weakness (generalized): Secondary | ICD-10-CM | POA: Diagnosis not present

## 2023-04-06 DIAGNOSIS — M5416 Radiculopathy, lumbar region: Secondary | ICD-10-CM | POA: Diagnosis not present

## 2023-04-06 DIAGNOSIS — R2681 Unsteadiness on feet: Secondary | ICD-10-CM

## 2023-04-06 DIAGNOSIS — M21371 Foot drop, right foot: Secondary | ICD-10-CM | POA: Diagnosis not present

## 2023-04-06 NOTE — Therapy (Addendum)
OUTPATIENT PHYSICAL THERAPY THORACOLUMBAR / NEURO TREATMENT   Patient Name: Alexis Campos MRN: [id number] DOB:1966/07/13, 57 y.o., female Today's Date: 04/06/2023  END OF SESSION:  PT End of Session - 04/06/23 1325     Visit Number 4    Number of Visits 7    Date for PT Re-Evaluation 05/03/23    Authorization Type [facility] [employer] Plan    PT Start Time 1317    PT Stop Time 1357    PT Time Calculation (min) 40 min    Equipment Utilized During Treatment Gait belt    Activity Tolerance Patient tolerated treatment well    Behavior During Therapy WFL for tasks assessed/performed             Past Medical History:  Diagnosis Date   Anemia    Anemia 05/22/2014   Blood transfusion    Diabetes mellitus    diagnosed 1996-insulin and metformin   Endometriosis    Fibromyalgia    GERD (gastroesophageal reflux disease)    History of blood transfusion 12/09   [facility]   Hyperlipidemia    Hypertension    Neuromuscular disorder (HCC)    fibromyalgia   Seasonal allergies    recent bronchitis-chest xray/ct scan-granuloma noted present since 2008   Sinusitis    Splenomegaly 05/22/2014   Past Surgical History:  Procedure Laterality Date   ABDOMINAL SURGERY     LOA for endomeriosis   APPENDECTOMY  05/1995   CESAREAN SECTION     CHOLECYSTECTOMY  01/1995   COLONOSCOPY     lysis of adhesions  1996/ 2002   TONSILLECTOMY  09/1994   Patient Active Problem List   Diagnosis Date Noted   Hyperlipidemia 11/23/2022   Diabetes mellitus type 2 with ketoacidosis (HCC) 12/27/2021   Pyelonephritis    Sepsis (HCC) 12/26/2021   Splenomegaly 05/22/2014   Anemia 05/22/2014   Obesity (BMI 30-39.9) 02/26/2014   Diabetes (HCC) 11/15/2012   Abdominal pain, right side. 10/23/2011    PCP: Kathline Magic, NP  REFERRING PROVIDER: Linus Galas, NP  REFERRING DIAG: [phone] (ICD-10-CM) - Foot drop, right foot R20.0 (ICD-10-CM) - Anesthesia of skin  Rationale for Evaluation and Treatment:  Rehabilitation  THERAPY DIAG:  Abnormality of gait and mobility  Unsteadiness on feet  Muscle weakness (generalized)  Foot drop, right  Radiculopathy, lumbar region  ONSET DATE: 02/16/2023 (referral date)  SUBJECTIVE:                                                                                                                                                                                           SUBJECTIVE STATEMENT: Pt denies falls/near falls.  Patient reports that she has felt dizzy more recently; unsure if vestibular or due to drop in BP. Would like to do dry needling today if possible.  Pt still waiting on MRI results from December, thought she might need injections.  PERTINENT HISTORY:  DM II on insulin, R fracture on RLE foot about 2 years ago ("not bad fracture"), L foot sprain, fibromyalgia  PAIN:  Are you having pain? None when sitting still Yes: NPRS scale: 4/10 Pain location: coming across both hips Pain description: short but sharp Aggravating factors: working 12 hour shifts Relieving factors: heating pack, relaxing  PRECAUTIONS: Fall  RED FLAGS: Bowel or bladder incontinence: No Denies difficulty swallowing   WEIGHT BEARING RESTRICTIONS: No  FALLS:  Has patient fallen in last 6 months? Yes. Number of falls 2 falls related to RLE weakness, denies major injuries, and is able to get up from the ground with some minor difficulty  LIVING ENVIRONMENT: Lives with: lives with their family - 51 year old child Lives in: House/apartment Stairs: Yes: External: 2 steps; none Has following equipment at home: Dan Humphreys - 2 wheeled  OCCUPATION: RN working at [employer]  PLOF: Working full time, high level physical job  PATIENT GOALS: "I am hoping physical therapy can help if its a disc problem. I do not want surgery at all costs."   NEXT MD VISIT: Patient may have to go back to orthopedic pending on our findings  OBJECTIVE:   DIAGNOSTIC FINDINGS:  MRI performed up  hips but not available to PT; per patient showed mild arthritic changes and some bulging of discs  PATIENT SURVEYS:  Modified Oswestry = 7/50 or 14%   SCREENING FOR RED FLAGS: Bowel or bladder incontinence: No Spinal tumors: No Cauda equina syndrome: No Compression fracture: No Abdominal aneurysm: No  COGNITION: Overall cognitive status: Within functional limits for tasks assessed     SENSATION: R lateral side from knee into L medial malleolus of ankle  POSTURE: rounded shoulders and forward head  PALPATION: Extremely tight through lumbar extensors   LUMBAR ROM:   AROM eval  Flexion WFL - pain with coming up  Extension 80% - more painful than going forward  Right lateral flexion WFL - minor pull on the R  Left lateral flexion 80% - mild pull on the R  Right rotation 50% - painfree  Left rotation 50% painfree   (Blank rows = not tested)  LOWER EXTREMITY ROM:     Decreased ankle ROM (DF and inversion on the RLE < 50% ROM)  LOWER EXTREMITY MMT:    MMT Right eval Left eval  Hip flexion 4-/5 4/5  Hip extension    Hip abduction    Hip adduction    Hip internal rotation    Hip external rotation    Knee flexion 3+/5 4-/5  Knee extension 3+/5 4-/5  Ankle dorsiflexion 2/5 4/5  Ankle plantarflexion 3/5 4/5  Ankle inversion 4/5   Ankle eversion 2/5    (Blank rows = not tested)  LUMBAR SPECIAL TESTS:  Slump test: no major change with distal component   TODAY'S TREATMENT:  Vitals:   04/06/23 1330  BP: 109/74  Pulse: 75   TherAct:   Provided patient reprint out of bracing discussed last session per patient request.   Trigger Point Dry-Needling  Treatment instructions: Expect mild to moderate muscle soreness. S/S of pneumothorax if dry needled over a lung field, and to seek immediate medical attention should they occur. Patient  verbalized understanding of these instructions and education.  Patient Consent Given: Yes Education handout provided: Previously provided Muscles treated: L and R lumbar iliocostalis and multifidus, R quadratus lumborum Treatment response/outcome: deep ache/muscle cramp; muscle twitch detected   Orthostatic Assessment:  Supine: 128/67 mmHg (73 bpm) Sitting EOM: 124/75 mmHg (75 bpm)  Standing: 120/66 mmHg (80 bpm)  Standing after 2 min: 121/72 mmHg (77 bpm)   PATIENT EDUCATION:  Education details: AFO info + continue HEP as noted above Person educated: Patient Education method: Explanation, Demonstration, and Handouts Education comprehension: verbalized understanding, returned demonstration, and needs further education  HOME EXERCISE PROGRAM: Access Code: [email or web address] URL: [email or web address] Date: 03/08/2023 Prepared by: Maryruth Eve  Exercises - Lying Prone with 1 Pillow  - 1 x daily - 7 x weekly - 2 sets - 5-10 minutes hold - Supine Lower Trunk Rotation  - 1 x daily - 7 x weekly - 3 sets - 10 reps - Supine Posterior Pelvic Tilt  - 1 x daily - 7 x weekly - 3 sets - 10 reps - 5 sec hold - Supine Bridge  - 1 x daily - 7 x weekly - 3 sets - 5 reps - 5 sec hold hold  ASSESSMENT:  CLINICAL IMPRESSION: Emphasis of skilled PT session on assessment of BP as patient reporting episodes of dizziness when standing as well as dry needling to address lumbar muscular tightness/pain. Patient states did not take BP mediation today; 9 mmHg drop noted from sitting to stand in diastolic. While not technically classified as orthostatic; encouraged patient to take time with position changes and follow up with PCP. Pt with palpable muscle twitches detected with TPDN this date. Continue POC.   OBJECTIVE IMPAIRMENTS: Abnormal gait, decreased balance, difficulty walking, decreased ROM, decreased strength, increased muscle spasms, and pain.   ACTIVITY LIMITATIONS: bending, sitting,  transfers, and locomotion level  PARTICIPATION LIMITATIONS: cleaning, community activity, and occupation  PERSONAL FACTORS: Profession, Time since onset of injury/illness/exacerbation, and 1-2 comorbidities: see above  are also affecting patient's functional outcome.   REHAB POTENTIAL: Good  CLINICAL DECISION MAKING: Stable/uncomplicated  EVALUATION COMPLEXITY: Low   GOALS: Goals reviewed with patient? Yes  SHORT TERM GOALS: Target date: 04/05/2023  Patient will demonstrate 100% compliance with initial HEP to continue to progress between physical therapy sessions.   Baseline: To be provided Goal status: INITIAL  2.  mCTSIB to assessed/ LTG goal written Baseline: 30 sec on all conditions (7/30) Goal status: GOAL MET  LONG TERM GOALS: Target date: 04/19/2023  Patient will report demonstrate independence with final HEP in order to maintain current gains and continue to progress after physical therapy discharge.   Baseline: To be assessed Goal status: INITIAL  2.  Patient will improve modified ODI score to < 8% impairment indicate a clinically important improvement in low back pain.   Baseline: 14% Impairment Goal status: INITIAL  3.  Patient will improve their 5x Sit to Stand score to less than 14 seconds to demonstrate a decreased risk for falls and improved LE strength.   Baseline: 17.77 seconds without UE use Goal status: INITIAL  4.  mCTSIB to be  assessed/ goal written as indicated Baseline: 30 sec on all conditions (7/30) Goal status: GOAL D/C DUE TO PT SCORE  5.  Therapist will trial foot up brace / other orthotic brace as indicated to improve safety with ambulation and LE clearance.  Baseline: To be  Goal status: INITIAL  PLAN:  PT FREQUENCY: 1x/week  PT DURATION: 6 weeks  PLANNED INTERVENTIONS: Therapeutic exercises, Therapeutic activity, Neuromuscular re-education, Balance training, Gait training, Patient/Family education, Self Care, Joint mobilization, Dry  Needling, Manual therapy, and Re-evaluation.  PLAN FOR NEXT SESSION: dry needling, update HEP PRN (child's pose vs anterior leans on therapy ball, progression of supine core strengthening-marches, dead bugs, etc), townsend brace update?; add more dry needling appointments, how did patient feel after dry needling?    Maryruth Eve, PT, DPT Peter Congo, PT, DPT, CSRS  04/06/2023, 2:11 PM

## 2023-04-13 ENCOUNTER — Encounter: Payer: Self-pay | Admitting: Physical Therapy

## 2023-04-13 ENCOUNTER — Ambulatory Visit: Payer: Commercial Managed Care - PPO | Admitting: Physical Therapy

## 2023-04-13 VITALS — BP 112/73 | HR 72

## 2023-04-13 DIAGNOSIS — R2681 Unsteadiness on feet: Secondary | ICD-10-CM

## 2023-04-13 DIAGNOSIS — M21371 Foot drop, right foot: Secondary | ICD-10-CM | POA: Diagnosis not present

## 2023-04-13 DIAGNOSIS — M6281 Muscle weakness (generalized): Secondary | ICD-10-CM

## 2023-04-13 DIAGNOSIS — R269 Unspecified abnormalities of gait and mobility: Secondary | ICD-10-CM | POA: Diagnosis not present

## 2023-04-13 DIAGNOSIS — M5416 Radiculopathy, lumbar region: Secondary | ICD-10-CM | POA: Diagnosis not present

## 2023-04-13 NOTE — Therapy (Signed)
OUTPATIENT PHYSICAL THERAPY THORACOLUMBAR / NEURO TREATMENT   Patient Name: Alexis Campos MRN: [id number] DOB:1965-11-25, 57 y.o., female Today's Date: 04/13/2023  END OF SESSION:  PT End of Session - 04/13/23 1240     Visit Number 5    Number of Visits 7    Date for PT Re-Evaluation 05/03/23    Authorization Type [facility] [employer] Plan    PT Start Time 1238    PT Stop Time 1316    PT Time Calculation (min) 38 min    Equipment Utilized During Treatment Gait belt    Activity Tolerance Patient tolerated treatment well    Behavior During Therapy WFL for tasks assessed/performed             Past Medical History:  Diagnosis Date   Anemia    Anemia 05/22/2014   Blood transfusion    Diabetes mellitus    diagnosed 1996-insulin and metformin   Endometriosis    Fibromyalgia    GERD (gastroesophageal reflux disease)    History of blood transfusion 12/09   [facility]   Hyperlipidemia    Hypertension    Neuromuscular disorder (HCC)    fibromyalgia   Seasonal allergies    recent bronchitis-chest xray/ct scan-granuloma noted present since 2008   Sinusitis    Splenomegaly 05/22/2014   Past Surgical History:  Procedure Laterality Date   ABDOMINAL SURGERY     LOA for endomeriosis   APPENDECTOMY  05/1995   CESAREAN SECTION     CHOLECYSTECTOMY  01/1995   COLONOSCOPY     lysis of adhesions  1996/ 2002   TONSILLECTOMY  09/1994   Patient Active Problem List   Diagnosis Date Noted   Hyperlipidemia 11/23/2022   Diabetes mellitus type 2 with ketoacidosis (HCC) 12/27/2021   Pyelonephritis    Sepsis (HCC) 12/26/2021   Splenomegaly 05/22/2014   Anemia 05/22/2014   Obesity (BMI 30-39.9) 02/26/2014   Diabetes (HCC) 11/15/2012   Abdominal pain, right side. 10/23/2011    PCP: Kathline Magic, NP  REFERRING PROVIDER: Linus Galas, NP  REFERRING DIAG: [phone] (ICD-10-CM) - Foot drop, right foot R20.0 (ICD-10-CM) - Anesthesia of skin  Rationale for Evaluation and Treatment:  Rehabilitation  THERAPY DIAG:  Abnormality of gait and mobility  Unsteadiness on feet  Muscle weakness (generalized)  ONSET DATE: 02/16/2023 (referral date)  SUBJECTIVE:                                                                                                                                                                                           SUBJECTIVE STATEMENT: Pt arrives to session with new foot brace. Denies falls/near falls. Patient  reports she was more sore after dry needling last time.   Pt still waiting on MRI results from December, thought she might need injections.  PERTINENT HISTORY:  DM II on insulin, R fracture on RLE foot about 2 years ago ("not bad fracture"), L foot sprain, fibromyalgia  PAIN:  Are you having pain? None when sitting still Yes: NPRS scale: 2-3/10 Pain location: coming across both hips Pain description: short but sharp Aggravating factors: working 12 hour shifts Relieving factors: heating pack, relaxing  PRECAUTIONS: Fall  RED FLAGS: Bowel or bladder incontinence: No Denies difficulty swallowing   WEIGHT BEARING RESTRICTIONS: No  FALLS:  Has patient fallen in last 6 months? Yes. 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 I do not want surgery at all costs."   NEXT MD VISIT: Patient may have to go back to orthopedic pending on our findings  OBJECTIVE:   DIAGNOSTIC FINDINGS:  MRI performed up hips but not available to PT; per patient showed mild arthritic changes and some bulging of  discs  PATIENT SURVEYS:  Modified Oswestry = 7/50 or 14%   SCREENING FOR RED FLAGS: Bowel or bladder incontinence: No Spinal tumors: No Cauda equina syndrome: No Compression fracture: No Abdominal aneurysm: No  COGNITION: Overall cognitive status: Within functional limits for tasks assessed     SENSATION: R lateral side from knee into L medial malleolus of ankle  POSTURE: rounded shoulders and forward head  PALPATION: Extremely tight through lumbar extensors   LUMBAR ROM:   AROM eval  Flexion WFL - pain with coming up  Extension 80% - more painful than going forward  Right lateral flexion WFL - minor pull on the R  Left lateral flexion 80% - mild pull on the R  Right rotation 50% - painfree  Left rotation 50% painfree   (Blank rows = not tested)  LOWER EXTREMITY ROM:     Decreased ankle ROM (DF and inversion on the RLE < 50% ROM)  LOWER EXTREMITY MMT:    MMT Right eval Left eval  Hip flexion 4-/5 4/5  Hip extension    Hip abduction    Hip adduction    Hip internal rotation    Hip external rotation    Knee flexion 3+/5 4-/5  Knee extension 3+/5 4-/5  Ankle dorsiflexion 2/5 4/5  Ankle plantarflexion 3/5 4/5  Ankle inversion 4/5   Ankle eversion 2/5    (Blank rows = not tested)  LUMBAR SPECIAL TESTS:  Slump test: no major change with distal component   TODAY'S TREATMENT:  Vitals:   04/13/23 1258  BP: 112/73  Pulse: 72    TherAct:   Assisted patient with setup with foot up brace for RLE. Initially required minA but modI by end of session. Ambulated 1 x 115 and patient appropriate with step amplitude and pacing.   TherEx:   - Supine Hip External Rotation Stretch  - 3 sets - 30 second holds hold - Supine Single Knee to Chest Stretch - 3 sets - 30 seconds hold - Dead Bug  - 3 sets - 10 reps - Isometric Dead Bug  - 3 sets -  30 seconds hold - Supine Hip Adduction Isometric with Ball - 3-4 x weekly - 2 sets - 10 reps - 10 hold - Hooklying Clamshell with Resistance  - 2 sets - 10 reps - 10 second holds hold - Bridge with Hip Abduction and Resistance  - 2 sets - 10 reps - 10 second holds hold  PATIENT EDUCATION:  Education details: AFO info + continue HEP as noted above Person educated: Patient Education method: Explanation, Demonstration, and Handouts Education comprehension: verbalized understanding, returned demonstration, and needs further education  HOME EXERCISE PROGRAM: Access Code: [email or web address] URL: [email or web address] Date: 04/13/2023 Prepared by: Maryruth Eve  Exercises - Lying Prone with 1 Pillow  - 1 x daily - 7 x weekly - 2 sets - 5-10 minutes hold - Supine Lower Trunk Rotation  - 1 x daily - 7 x weekly - 3 sets - 10 reps - Supine Posterior Pelvic Tilt  - 1 x daily - 7 x weekly - 3 sets - 10 reps - 5 sec hold - Supine Bridge  - 1 x daily - 7 x weekly - 3 sets - 5 reps - 5 sec hold hold - Supine Hip External Rotation Stretch  - 1 x daily - 7 x weekly - 3 sets - 30 second holds hold - Supine Single Knee to Chest Stretch  - 1 x daily - 7 x weekly - 3 sets - 30 seconds hold - Dead Bug  - 1 x daily - 3-4 x weekly - 3 sets - 10 reps - Isometric Dead Bug  - 1 x daily - 3-4 x weekly - 3 sets - 30 seconds hold - Supine Hip Adduction Isometric with Ball  - 1 x daily - 3-4 x weekly - 2 sets - 10 reps - 10 hold - Hooklying Clamshell with Resistance  - 1 x daily - 3-4 x weekly - 2 sets - 10 reps - 10 second holds hold - Bridge with Hip Abduction and Resistance  - 1 x daily - 7 x weekly - 2 sets - 10 reps - 10 second holds hold  ASSESSMENT:  CLINICAL IMPRESSION: Emphasis of skilled PT session on use of foot up brace and progression of HEP. Patient initially required minA with right foot up brace but modI with repetition by end of session. Progressed HEP which patient tolerated well without major  increase in pain. Continue POC.   OBJECTIVE IMPAIRMENTS: Abnormal gait, decreased balance, difficulty walking, decreased ROM, decreased strength, increased muscle spasms, and pain.   ACTIVITY LIMITATIONS: bending, sitting, transfers, and locomotion level  PARTICIPATION LIMITATIONS: cleaning, community activity, and occupation  PERSONAL FACTORS: Profession, Time since onset of injury/illness/exacerbation, and 1-2 comorbidities: see above  are also affecting patient's functional outcome.   REHAB POTENTIAL: Good  CLINICAL DECISION MAKING: Stable/uncomplicated  EVALUATION COMPLEXITY: Low   GOALS: Goals reviewed with patient? Yes  SHORT TERM GOALS: Target date: 04/05/2023  Patient will demonstrate 100% compliance with initial HEP to continue to progress between physical therapy sessions.   Baseline: To be provided Goal status: INITIAL  2.  mCTSIB to assessed/ LTG goal written Baseline: 30 sec on all conditions (7/30) Goal status: GOAL MET  LONG TERM GOALS: Target date: 04/19/2023  Patient will report demonstrate independence with final HEP in order to maintain current gains and continue to progress after physical therapy discharge.   Baseline: To be assessed Goal status: INITIAL  2.  Patient will improve modified ODI score to < 8% impairment indicate a clinically important improvement in low back pain.   Baseline: 14% Impairment Goal status: INITIAL  3.  Patient will improve their 5x Sit to Stand score to less than 14 seconds to demonstrate a decreased risk for falls and improved LE strength.   Baseline: 17.77 seconds without UE use Goal status: INITIAL  4.  mCTSIB to be assessed/ goal written as indicated Baseline: 30 sec on all conditions (7/30) Goal status: GOAL D/C DUE TO PT SCORE  5.  Therapist will trial foot up brace / other orthotic brace as indicated to improve safety with ambulation and LE clearance.  Baseline: To be  Goal status: INITIAL  PLAN:  PT FREQUENCY:  1x/week  PT DURATION: 6 weeks  PLANNED INTERVENTIONS: Therapeutic exercises, Therapeutic activity, Neuromuscular re-education, Balance training, Gait training, Patient/Family education, Self Care, Joint mobilization, Dry Needling, Manual therapy, and Re-evaluation.  PLAN FOR NEXT SESSION: dry needling, update HEP PRN (child's pose vs anterior leans on therapy ball, progression of supine core strengthening-marches, dead bugs, etc), on last recert for one visit for AFO checkin in about 30 days; trial self management of AFO next session plus lifting mechanics  Maryruth Eve, PT, DPT  04/13/2023, 2:02 PM

## 2023-04-16 ENCOUNTER — Telehealth: Payer: Self-pay | Admitting: Physical Therapy

## 2023-04-16 NOTE — Telephone Encounter (Signed)
Therapist faxed order for AFO to [facility] on 04/16/2023.  Therapist called [facility] on 04/20/2023 to check status of AFO order. Melissa informed therapist that AFO order received. They will reach out to patient to schedule appointment shortly.  Maryruth Eve, PT, DPT

## 2023-04-20 ENCOUNTER — Telehealth: Payer: Self-pay | Admitting: Physical Therapy

## 2023-04-20 ENCOUNTER — Ambulatory Visit: Payer: Commercial Managed Care - PPO | Admitting: Physical Therapy

## 2023-04-20 NOTE — Telephone Encounter (Signed)
Therapist called patient but unable to leave VM regarding no show to appointment.  Maryruth Eve, PT, DPT

## 2023-04-21 ENCOUNTER — Other Ambulatory Visit (HOSPITAL_COMMUNITY): Payer: Self-pay

## 2023-04-26 ENCOUNTER — Other Ambulatory Visit: Payer: Self-pay

## 2023-04-29 ENCOUNTER — Other Ambulatory Visit (HOSPITAL_COMMUNITY): Payer: Self-pay

## 2023-05-25 ENCOUNTER — Other Ambulatory Visit (HOSPITAL_COMMUNITY): Payer: Self-pay

## 2023-05-27 ENCOUNTER — Other Ambulatory Visit (HOSPITAL_COMMUNITY): Payer: Self-pay

## 2023-05-27 MED ORDER — INFLUENZA VIRUS VACC SPLIT PF (FLUZONE) 0.5 ML IM SUSY
0.5000 mL | PREFILLED_SYRINGE | Freq: Once | INTRAMUSCULAR | 0 refills | Status: AC
  Filled 2023-05-27: qty 0.5, 1d supply, fill #0

## 2023-06-01 DIAGNOSIS — R6889 Other general symptoms and signs: Secondary | ICD-10-CM | POA: Diagnosis not present

## 2023-06-01 DIAGNOSIS — R000 Tachycardia, unspecified: Secondary | ICD-10-CM | POA: Diagnosis not present

## 2023-06-01 DIAGNOSIS — R5383 Other fatigue: Secondary | ICD-10-CM | POA: Diagnosis not present

## 2023-06-03 ENCOUNTER — Encounter (HOSPITAL_BASED_OUTPATIENT_CLINIC_OR_DEPARTMENT_OTHER): Payer: Self-pay | Admitting: Emergency Medicine

## 2023-06-03 ENCOUNTER — Emergency Department (HOSPITAL_BASED_OUTPATIENT_CLINIC_OR_DEPARTMENT_OTHER): Payer: Commercial Managed Care - PPO

## 2023-06-03 ENCOUNTER — Other Ambulatory Visit: Payer: Self-pay

## 2023-06-03 ENCOUNTER — Inpatient Hospital Stay (HOSPITAL_BASED_OUTPATIENT_CLINIC_OR_DEPARTMENT_OTHER)
Admission: EM | Admit: 2023-06-03 | Discharge: 2023-06-10 | DRG: 392 | Disposition: A | Discharge status: Home / Self Care | Payer: Commercial Managed Care - PPO | Attending: Family Medicine | Admitting: Family Medicine

## 2023-06-03 DIAGNOSIS — Z7985 Long-term (current) use of injectable non-insulin antidiabetic drugs: Secondary | ICD-10-CM | POA: Diagnosis not present

## 2023-06-03 DIAGNOSIS — Z9641 Presence of insulin pump (external) (internal): Secondary | ICD-10-CM | POA: Diagnosis present

## 2023-06-03 DIAGNOSIS — Z794 Long term (current) use of insulin: Secondary | ICD-10-CM

## 2023-06-03 DIAGNOSIS — E876 Hypokalemia: Secondary | ICD-10-CM | POA: Diagnosis present

## 2023-06-03 DIAGNOSIS — R161 Splenomegaly, not elsewhere classified: Secondary | ICD-10-CM | POA: Diagnosis not present

## 2023-06-03 DIAGNOSIS — R109 Unspecified abdominal pain: Secondary | ICD-10-CM | POA: Diagnosis present

## 2023-06-03 DIAGNOSIS — I10 Essential (primary) hypertension: Secondary | ICD-10-CM | POA: Diagnosis present

## 2023-06-03 DIAGNOSIS — N183 Chronic kidney disease, stage 3 unspecified: Secondary | ICD-10-CM | POA: Diagnosis present

## 2023-06-03 DIAGNOSIS — Z8249 Family history of ischemic heart disease and other diseases of the circulatory system: Secondary | ICD-10-CM

## 2023-06-03 DIAGNOSIS — G2581 Restless legs syndrome: Secondary | ICD-10-CM | POA: Diagnosis present

## 2023-06-03 DIAGNOSIS — N390 Urinary tract infection, site not specified: Secondary | ICD-10-CM | POA: Diagnosis present

## 2023-06-03 DIAGNOSIS — Z885 Allergy status to narcotic agent status: Secondary | ICD-10-CM

## 2023-06-03 DIAGNOSIS — R9431 Abnormal electrocardiogram [ECG] [EKG]: Secondary | ICD-10-CM | POA: Diagnosis not present

## 2023-06-03 DIAGNOSIS — R002 Palpitations: Secondary | ICD-10-CM

## 2023-06-03 DIAGNOSIS — Z9049 Acquired absence of other specified parts of digestive tract: Secondary | ICD-10-CM | POA: Diagnosis not present

## 2023-06-03 DIAGNOSIS — Z888 Allergy status to other drugs, medicaments and biological substances status: Secondary | ICD-10-CM

## 2023-06-03 DIAGNOSIS — E785 Hyperlipidemia, unspecified: Secondary | ICD-10-CM | POA: Diagnosis present

## 2023-06-03 DIAGNOSIS — E669 Obesity, unspecified: Secondary | ICD-10-CM | POA: Diagnosis present

## 2023-06-03 DIAGNOSIS — R000 Tachycardia, unspecified: Secondary | ICD-10-CM | POA: Diagnosis not present

## 2023-06-03 DIAGNOSIS — K219 Gastro-esophageal reflux disease without esophagitis: Secondary | ICD-10-CM | POA: Diagnosis present

## 2023-06-03 DIAGNOSIS — E119 Type 2 diabetes mellitus without complications: Secondary | ICD-10-CM

## 2023-06-03 DIAGNOSIS — E1165 Type 2 diabetes mellitus with hyperglycemia: Secondary | ICD-10-CM | POA: Diagnosis not present

## 2023-06-03 DIAGNOSIS — R112 Nausea with vomiting, unspecified: Principal | ICD-10-CM | POA: Diagnosis present

## 2023-06-03 DIAGNOSIS — I2510 Atherosclerotic heart disease of native coronary artery without angina pectoris: Secondary | ICD-10-CM | POA: Diagnosis present

## 2023-06-03 DIAGNOSIS — E11649 Type 2 diabetes mellitus with hypoglycemia without coma: Secondary | ICD-10-CM | POA: Diagnosis not present

## 2023-06-03 DIAGNOSIS — T50995A Adverse effect of other drugs, medicaments and biological substances, initial encounter: Secondary | ICD-10-CM | POA: Diagnosis present

## 2023-06-03 DIAGNOSIS — K3184 Gastroparesis: Secondary | ICD-10-CM | POA: Diagnosis not present

## 2023-06-03 DIAGNOSIS — E860 Dehydration: Secondary | ICD-10-CM | POA: Diagnosis present

## 2023-06-03 DIAGNOSIS — E1122 Type 2 diabetes mellitus with diabetic chronic kidney disease: Secondary | ICD-10-CM | POA: Diagnosis not present

## 2023-06-03 DIAGNOSIS — E782 Mixed hyperlipidemia: Secondary | ICD-10-CM | POA: Diagnosis not present

## 2023-06-03 DIAGNOSIS — R7989 Other specified abnormal findings of blood chemistry: Secondary | ICD-10-CM | POA: Diagnosis not present

## 2023-06-03 DIAGNOSIS — M797 Fibromyalgia: Secondary | ICD-10-CM | POA: Diagnosis present

## 2023-06-03 DIAGNOSIS — E1169 Type 2 diabetes mellitus with other specified complication: Secondary | ICD-10-CM | POA: Diagnosis present

## 2023-06-03 DIAGNOSIS — K529 Noninfective gastroenteritis and colitis, unspecified: Secondary | ICD-10-CM | POA: Diagnosis not present

## 2023-06-03 DIAGNOSIS — K56609 Unspecified intestinal obstruction, unspecified as to partial versus complete obstruction: Secondary | ICD-10-CM | POA: Diagnosis not present

## 2023-06-03 DIAGNOSIS — I129 Hypertensive chronic kidney disease with stage 1 through stage 4 chronic kidney disease, or unspecified chronic kidney disease: Secondary | ICD-10-CM | POA: Diagnosis present

## 2023-06-03 DIAGNOSIS — Z79899 Other long term (current) drug therapy: Secondary | ICD-10-CM

## 2023-06-03 DIAGNOSIS — K838 Other specified diseases of biliary tract: Secondary | ICD-10-CM | POA: Diagnosis not present

## 2023-06-03 LAB — BASIC METABOLIC PANEL
Anion gap: 16 — ABNORMAL HIGH (ref 5–15)
BUN: 33 mg/dL — ABNORMAL HIGH (ref 6–20)
CO2: 20 mmol/L — ABNORMAL LOW (ref 22–32)
Calcium: 10.3 mg/dL (ref 8.9–10.3)
Chloride: 102 mmol/L (ref 98–111)
Creatinine, Ser: 1.27 mg/dL — ABNORMAL HIGH (ref 0.44–1.00)
GFR, Estimated: 49 mL/min — ABNORMAL LOW (ref >60)
Glucose, Bld: 227 mg/dL — ABNORMAL HIGH (ref 70–99)
Potassium: 4 mmol/L (ref 3.5–5.1)
Sodium: 138 mmol/L (ref 135–145)

## 2023-06-03 LAB — TROPONIN I (HIGH SENSITIVITY)
Troponin I (High Sensitivity): 6 ng/L (ref <18)
Troponin I (High Sensitivity): 10 ng/L (ref <18)

## 2023-06-03 LAB — GLUCOSE, CAPILLARY: Glucose-Capillary: 83 mg/dL (ref 70–99)

## 2023-06-03 LAB — CBC
HCT: 39.9 % (ref 36.0–46.0)
Hemoglobin: 14.1 g/dL (ref 12.0–15.0)
MCH: 30.9 pg (ref 26.0–34.0)
MCHC: 35.3 g/dL (ref 30.0–36.0)
MCV: 87.3 fL (ref 80.0–100.0)
Platelets: 275 10*3/uL (ref 150–400)
RBC: 4.57 MIL/uL (ref 3.87–5.11)
RDW: 12.5 % (ref 11.5–15.5)
WBC: 8.7 10*3/uL (ref 4.0–10.5)
nRBC: 0 % (ref 0.0–0.2)

## 2023-06-03 MED ORDER — ENOXAPARIN SODIUM 40 MG/0.4ML IJ SOSY: 40.0000 mg | PREFILLED_SYRINGE | INTRAMUSCULAR | Status: DC

## 2023-06-03 MED ORDER — SODIUM CHLORIDE 0.9 % IV SOLN
12.5000 mg | Freq: Four times a day (QID) | INTRAVENOUS | Status: DC | PRN
  Administered 2023-06-03 – 2023-06-04 (×2): 12.5 mg via INTRAVENOUS
  Filled 2023-06-03: qty 0.5
  Filled 2023-06-03: qty 12.5
  Filled 2023-06-03: qty 0.5

## 2023-06-03 MED ORDER — LACTATED RINGERS IV SOLN: INTRAVENOUS | Status: DC

## 2023-06-03 MED ORDER — SODIUM CHLORIDE 0.9 % IV BOLUS
1000.0000 mL | Freq: Once | INTRAVENOUS | Status: AC
  Administered 2023-06-03: 1000 mL via INTRAVENOUS

## 2023-06-03 MED ORDER — ONDANSETRON HCL 4 MG/2ML IJ SOLN
4.0000 mg | Freq: Once | INTRAMUSCULAR | Status: AC
  Administered 2023-06-03: 4 mg via INTRAVENOUS
  Filled 2023-06-03: qty 2

## 2023-06-03 MED ORDER — INSULIN ASPART 100 UNIT/ML IJ SOLN
0.0000 [IU] | Freq: Three times a day (TID) | INTRAMUSCULAR | Status: DC
  Administered 2023-06-09: 2 [IU] via SUBCUTANEOUS

## 2023-06-03 MED ORDER — INSULIN ASPART 100 UNIT/ML IJ SOLN: 0.0000 [IU] | Freq: Every day | INTRAMUSCULAR | Status: DC

## 2023-06-03 MED ORDER — HYDROMORPHONE HCL 1 MG/ML IJ SOLN
0.5000 mg | INTRAMUSCULAR | Status: DC | PRN
  Administered 2023-06-05 – 2023-06-06 (×2): 0.5 mg via INTRAVENOUS
  Filled 2023-06-03 (×2): qty 0.5

## 2023-06-03 MED ORDER — PROMETHAZINE HCL 25 MG/ML IJ SOLN
INTRAMUSCULAR | Status: AC
  Filled 2023-06-03: qty 1

## 2023-06-03 NOTE — ED Notes (Addendum)
Pt. Complaining of right flank/ back pain and not tolerating PO liquids. Conner PA aware.

## 2023-06-03 NOTE — ED Provider Notes (Signed)
[place] EMERGENCY DEPARTMENT AT [facility] Provider Note   CSN: [id number] Arrival date & time: 06/03/23  1352     History Chief Complaint  Patient presents with   Palpitations    Alexis Campos is a 57 y.o. female patient with history of GERD, hyperlipidemia, diabetes, hypertension who presents to the emergency department today for further evaluation of palpitations.  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  Over the weekend she start developing nausea and intractable vomiting which lasted through Monday.  She was unable to take her metoprolol over the weekend but started taking it on Monday.  She notes that she has been having decreased p.o. intake particularly fluids.  Over the last few days she has noticed that she has been having palpitations which she describes as a pounding sensation which is worse with exertion.  She also endorses a chest heaviness with movement.  She denies any abdominal pain, urinary complaints.   Palpitations      Home Medications Prior to Admission medications   Medication Sig Start Date End Date Taking? Authorizing Provider  ciprofloxacin (CIPRO) 500 MG tablet Take 1 tablet (500 mg total) by mouth every 12 (twelve) hours for 7 days 03/23/23     Continuous Blood Gluc Receiver (DEXCOM G6 RECEIVER) DEVI Use as directed. 01/21/21     cyclobenzaprine (FLEXERIL) 5 MG tablet Take 1-2 tablets (5-10 mg total) by mouth daily as needed. 03/23/23     diphenhydramine-acetaminophen (TYLENOL PM) 25-500 MG TABS tablet Take 2 tablets by mouth at bedtime as needed (sleep).    [provider]  doxycycline (VIBRAMYCIN) 100 MG capsule Take 1 capsule (100 mg total) by mouth 2 (two) times daily for 7 days. 04/07/22     escitalopram (LEXAPRO) 20 MG tablet TAKE 1 TABLET BY MOUTH ONCE DAILY 11/12/20    Dorisann Frames, MD  fenofibrate (TRICOR) 48 MG tablet Take 1 tablet (48 mg total) by mouth daily. 11/23/22   Nahser, Deloris Ping, MD  Insulin Disposable Pump (OMNIPOD 5 G6 POD, GEN 5,) MISC Use as directed every 2 days 02/27/21     insulin glargine-yfgn (SEMGLEE, YFGN,) 100 UNIT/ML Pen Inject 65 Units into the skin daily. 03/25/22     insulin glargine-yfgn (SEMGLEE, YFGN,) 100 UNIT/ML Pen Inject 70 Units into the skin daily. 03/12/23     insulin lispro (HUMALOG KWIKPEN) 200 UNIT/ML KwikPen Inject 5-15 Units into the skin 3 (three) times daily. 03/12/23     insulin lispro (HUMALOG) 100 UNIT/ML injection Inject 100 Units  into the skin daily via insulin pump 10/28/22     Insulin Pen Needle 31G X 8 MM MISC Use 4-5 times daily as directed 03/12/23     methocarbamol (ROBAXIN) 500 MG tablet Take 1 tablet (500 mg total) by mouth every 6-8 hours as needed for spasms 08/18/22     metoprolol tartrate (LOPRESSOR) 100 MG tablet Take 1 tablet (100 mg total) by mouth 2 (two) times daily with food 02/11/23     metoprolol tartrate (LOPRESSOR) 50 MG tablet Take 3 tablets (150 mg total) by mouth 2 (two) times daily. 07/07/22     ondansetron (ZOFRAN) 4 MG tablet Take 1 tablet (4 mg total) by mouth 2 (two) times daily as needed. 03/23/23     predniSONE (DELTASONE) 10 MG tablet Take 4 tablets by mouth daily for 2 days, then  3 tablets daily for 2 days, then 2 tablets for 2 days, then 1 tablet daily for 2 days, then 1/2 tablet daily for 2 days 02/11/23     rosuvastatin (CRESTOR) 40 MG tablet Take 1 tablet (40 mg total) by mouth daily. 11/23/22   Nahser, Deloris Ping, MD  tirzepatide [facility]) 10 MG/0.5ML Pen Inject 10 mg into the skin once a week. 03/15/23     VASCEPA 1 g capsule Take 2 capsules (2 g total) by mouth 2 (two) times daily. 11/23/22   Nahser, Deloris Ping, MD  insulin glargine (SEMGLEE) 100 UNIT/ML injection Inject 65 units into the skin daily. 03/25/22 09/24/22        Allergies    Codeine, Duloxetine hcl, Lisinopril, and Metoclopramide  hcl    Review of Systems   Review of Systems  Cardiovascular:  Positive for palpitations.  All other systems reviewed and are negative.   Physical Exam Updated Vital Signs BP (!) 147/132   Pulse (!) 128   Temp 98 F (36.7 C) (Oral)   Resp 16   Ht 5\' 10"  (1.778 m)   Wt 90.1 kg   LMP 06/14/2018 (Approximate)   SpO2 97%   BMI 28.50 kg/m  Physical Exam Vitals and nursing note reviewed.  Constitutional:      General: She is not in acute distress.    Appearance: Normal appearance.  HENT:     Head: Normocephalic and atraumatic.  Eyes:     General:        Right eye: No discharge.        Left eye: No discharge.  Cardiovascular:     Rate and Rhythm: Regular rhythm. Tachycardia present.     Pulses: No decreased pulses.     Heart sounds: Normal heart sounds.  Pulmonary:     Comments: Clear to auscultation bilaterally.  Normal effort.  No respiratory distress.  No evidence of wheezes, rales, or rhonchi heard throughout. Abdominal:     General: Abdomen is flat. Bowel sounds are normal. There is no distension.     Tenderness: There is no abdominal tenderness. There is no guarding or rebound.  Musculoskeletal:        General: Normal range of motion.     Cervical back: Neck supple.  Skin:    General: Skin is warm and dry.     Findings: No rash.  Neurological:     General: No focal deficit present.     Mental Status: She is alert.  Psychiatric:        Mood and Affect: Mood normal.        Behavior: Behavior normal.     ED Results / Procedures / Treatments   Labs (all labs ordered are listed, but only abnormal results are displayed) Labs Reviewed  CBC  BASIC METABOLIC PANEL  TROPONIN I (HIGH SENSITIVITY)    EKG EKG Interpretation Date/Time:  Thursday June 03 2023 14:02:45 EDT Ventricular Rate:  143 PR Interval:  136 QRS Duration:  76 QT Interval:  274 QTC Calculation: 422 R Axis:   25  Text Interpretation: Sinus tachycardia Cannot rule out Anterior infarct ,  age undetermined Abnormal ECG When compared with ECG of 26-Dec-2021 16:14, PREVIOUS ECG IS PRESENT Confirmed by Vanetta Mulders [phone]) on 06/03/2023 2:08:19 PM  Radiology No results found.  Procedures Procedures    Medications Ordered in ED Medications  sodium chloride 0.9 % bolus 1,000 mL (has no administration in time range)    ED Course/ Medical Decision Making/ A&P  Clinical Course as of 06/03/23 1911  Thu Jun 03, 2023  1458 Troponin I (High Sensitivity) Negative. [CF]  1458 Basic metabolic panel(!) Elevated creatinine likely secondary to dehydration.  [CF]  1459 CBC Normal. [CF]  1723 Patient was still feeling nauseous with p.o. challenge.  Will likely give her another liter of fluid to hopefully improve her heart rate.  Patient has not made any urine yet I do feel the patient is likely very dehydrated. [CF]  1726 Troponin I (High Sensitivity) Delta troponin is negative. [CF]  1833 Will plan to add a third liter of fluid and trial reglan. If patient's heart rate improves and she is overall feeling better she can likely go home.  If she does not improve she will need to be admitted for dehydration secondary to vomiting. [CF]    Clinical Course User Index [CF] Teressa Lower, PA-C   {   Click here for ABCD2, HEART and other calculators  Medical Decision Making CORDELIA CHEATAM is a 57 y.o. female patient who presents to the emergency department today for further evaluation of palpitations.  I do suspect this is likely related to dehydration.  Patient's heart rate is between 120 and 140.  I will plan to give her 1 L of fluid to see if this improves her tachycardia.  Will also plan to get some cardiac enzymes.  If fluid resuscitation does not prove useful I will plan to give her diltiazem.  3rd liter of fluids still running.  Will reevaluate after third liter.  If patient is still dizzy and tachycardic she will likely need to be admitted for further evaluation.  Due to  shift change, the rest of her care will be transferred to oncoming provider.  Ultimate disposition will be made by oncoming provider.  Amount and/or Complexity of Data Reviewed Labs: ordered. Decision-making details documented in ED Course. Radiology: ordered.  Risk Prescription drug management.    Final Clinical Impression(s) / ED Diagnoses Final diagnoses:  None    Rx / DC Orders ED Discharge Orders     None         Teressa Lower, [place] 06/03/23 1912    Vanetta Mulders, MD 06/04/23 [phone]

## 2023-06-03 NOTE — ED Notes (Signed)
Offered Pt. Something to drink. Pt. Stated,"I don't feel like I can drink yet."

## 2023-06-03 NOTE — Progress Notes (Signed)
Plan of Care Note for accepted transfer   Patient: Alexis Campos MRN: [id number]   DOA: 06/03/2023  Facility requesting transfer: [facility]   Requesting Provider: Dr. Elayne Snare   Reason for transfer: Intractable N/V, dehydration   Facility course: 57 year old female with HTN, HLD, DM, CKD 3, and minimal CAD who presents with palpitations and lightheadedness upon standing.  She has been experiencing nausea and recurrent bouts of vomiting for a few days and has been unable to tolerate her medications including metoprolol.  She is in sinus tachycardia with heart rate as high as 150s in the ED.  Labs appear to be stable and there are no acute findings on chest x-ray.  Heart rate and symptoms improved with IV fluids and antiemetics in the ED, but she is still unable to tolerate oral intake.  Plan of care: The patient is accepted for admission to Telemetry unit, at [facility]   Author: Briscoe Deutscher, MD 06/03/2023  Check [email or web address] for on-call coverage.  Nursing staff, Please call [facility] Admits & Consults System-Wide number on Amion as soon as patient's arrival, so appropriate admitting provider can evaluate the pt.

## 2023-06-03 NOTE — ED Triage Notes (Signed)
Pt via pov from home with palpitations (is on metoprolol for the same); states she was ill with virus over the weekend. She went to her PCP, who gave her a gram of rocephin on Tuesday and her HR was in the 130's. Pt reports lightheadedness when standing, as well as chest pressure. Pt alert & oriented, nad noted.

## 2023-06-03 NOTE — ED Notes (Addendum)
Pt reports right shoulder pain developing past 20 minutes. Does not worsen or improve with movement. Sitting up on edge of bed for comfort. EKG taken to provider.

## 2023-06-03 NOTE — ED Provider Notes (Signed)
Patient handed off from [place], [place]. Plan is reassess vitals and PO intake after third liter of fluids. If stable and tolerating PO, can anticipate discharge. Physical Exam  BP 106/81   Pulse (!) 112   Temp 98.5 F (36.9 C) (Oral)   Resp 20   Ht 5\' 10"  (1.778 m)   Wt 90.1 kg   LMP 06/14/2018 (Approximate)   SpO2 100%   BMI 28.50 kg/m   Physical Exam Vitals and nursing note reviewed.  Constitutional:      General: She is not in acute distress.    Appearance: Normal appearance.  HENT:     Head: Normocephalic and atraumatic.  Eyes:     General:        Right eye: No discharge.        Left eye: No discharge.  Cardiovascular:     Rate and Rhythm: Regular rhythm. Tachycardia present.     Pulses: No decreased pulses.     Heart sounds: Normal heart sounds.  Pulmonary:     Comments: Clear to auscultation bilaterally.  Normal effort.  No respiratory distress.  No evidence of wheezes, rales, or rhonchi heard throughout. Abdominal:     General: Abdomen is flat. Bowel sounds are normal. There is no distension.     Tenderness: There is no abdominal tenderness. There is no guarding or rebound.  Musculoskeletal:        General: Normal range of motion.     Cervical back: Neck supple.  Skin:    General: Skin is warm and dry.     Findings: No rash.  Neurological:     General: No focal deficit present.     Mental Status: She is alert.  Psychiatric:        Mood and Affect: Mood normal.        Behavior: Behavior normal.     Procedures  Procedures  ED Course / MDM   Clinical Course as of 06/03/23 1900  Thu Jun 03, 2023  1458 Troponin I (High Sensitivity) Negative. [CF]  1458 Basic metabolic panel(!) Elevated creatinine likely secondary to dehydration.  [CF]  1459 CBC Normal. [CF]  1723 Patient was still feeling nauseous with p.o. challenge.  Will likely give her another liter of fluid to hopefully improve her heart rate.  Patient has not made any urine yet I do feel the  patient is likely very dehydrated. [CF]  1726 Troponin I (High Sensitivity) Delta troponin is negative. [CF]  1833 Will plan to add a third liter of fluid and trial reglan. If patient's heart rate improves and she is overall feeling better she can likely go home.  If she does not improve she will need to be admitted for dehydration secondary to vomiting. [CF]    Clinical Course User Index [CF] Teressa Lower, PA-C   Medical Decision Making Amount and/or Complexity of Data Reviewed Labs: ordered. Decision-making details documented in ED Course. Radiology: ordered.  Risk Prescription drug management. Decision regarding hospitalization.   Patient handed off from [place], [place]. Please see their note for full HPI and physical exam findings. Plan is reassess vitals and PO intake after third liter of fluids. If stable and tolerating PO, can anticipate discharge.  Patient with improved tachycardia but still borderline tachycardic after fluids. Not tolerating PO at this time with persistent nausea still present. Abdomen remains nontender but given failure to tolerate PO, will plan on admission for intractable vomiting and persistent tachycardia.  Consulted with Dr. Antionette Char, hospitalist, regarding patient  care. Will admit for continued nausea control and hydration.        Smitty Knudsen, PA-C 06/03/23 2136    Royanne Foots, DO 06/07/23 [phone]

## 2023-06-03 NOTE — H&P (Signed)
History and Physical    Patient: Alexis Campos:[id number] DOB: 12/27/1965 DOA: 06/03/2023 DOS: the patient was seen and examined on 06/03/2023 [facility]: [facility]  Patient coming from: Home  Chief Complaint:  Chief Complaint  Patient presents with   Palpitations   HPI: Alexis Campos is a 57 y.o. female with medical history significant of insulin-dependent diabetes, hyperlipidemia, essential hypertension, chronic kidney disease stage III, coronary artery disease who [facility] with intractable nausea vomiting and dehydration.  Patient has been having significant nausea vomiting for 2 days now.  She is also has been unable to tolerate all of her medications.  Patient has been lightheaded with palpitations.  Was seen in the ER and evaluated.  She is seem to be dehydrated.  No prior diagnosis of gastroparesis but is a nondiabetic.  No sick contact.  No hematemesis no melena no bright red blood per rectum.  Her heart rate was as high as 150s in the ED.  Due to the intractable nausea vomiting patient admitted to the hospital for further evaluation and treatment.   Review of Systems: As mentioned in the history of present illness. All other systems reviewed and are negative. Past Medical History:  Diagnosis Date   Anemia    Anemia 05/22/2014   Blood transfusion    Diabetes mellitus    diagnosed 1996-insulin and metformin   Endometriosis    Fibromyalgia    GERD (gastroesophageal reflux disease)    History of blood transfusion 12/09   [facility]   Hyperlipidemia    Hypertension    Neuromuscular disorder (HCC)    fibromyalgia   Seasonal allergies    recent bronchitis-chest xray/ct scan-granuloma noted present since 2008   Sinusitis    Splenomegaly 05/22/2014   Past Surgical History:  Procedure Laterality Date   ABDOMINAL SURGERY     LOA for endomeriosis   APPENDECTOMY  05/1995   CESAREAN SECTION     CHOLECYSTECTOMY  01/1995    COLONOSCOPY     lysis of adhesions  1996/ 2002   TONSILLECTOMY  09/1994   Social History:  reports that she has never smoked. She has never used smokeless tobacco. She reports that she does not drink alcohol and does not use drugs.  Allergies  Allergen Reactions   Codeine Itching   Duloxetine Hcl Other (See Comments)    Adverse effect - Tremor   Lisinopril Itching   Metoclopramide Hcl Nausea Only    Restless leg     Family History  Problem Relation Age of Onset   Anemia Mother    Coronary artery disease Mother    CAD Father    CAD Brother     Prior to Admission medications   Medication Sig Start Date End Date Taking? Authorizing Provider  ciprofloxacin (CIPRO) 500 MG tablet Take 1 tablet (500 mg total) by mouth every 12 (twelve) hours for 7 days 03/23/23     Continuous Blood Gluc Receiver (DEXCOM G6 RECEIVER) DEVI Use as directed. 01/21/21     cyclobenzaprine (FLEXERIL) 5 MG tablet Take 1-2 tablets (5-10 mg total) by mouth daily as needed. 03/23/23     diphenhydramine-acetaminophen (TYLENOL PM) 25-500 MG TABS tablet Take 2 tablets by mouth at bedtime as needed (sleep).    [provider]  doxycycline (VIBRAMYCIN) 100 MG capsule Take 1 capsule (100 mg total) by mouth 2 (two) times daily for 7 days. 04/07/22     escitalopram (LEXAPRO) 20 MG tablet TAKE 1 TABLET BY  MOUTH ONCE DAILY 11/12/20   Dorisann Frames, MD  fenofibrate (TRICOR) 48 MG tablet Take 1 tablet (48 mg total) by mouth daily. 11/23/22   Nahser, Deloris Ping, MD  Insulin Disposable Pump (OMNIPOD 5 G6 POD, GEN 5,) MISC Use as directed every 2 days 02/27/21     insulin glargine-yfgn (SEMGLEE, YFGN,) 100 UNIT/ML Pen Inject 65 Units into the skin daily. 03/25/22     insulin glargine-yfgn (SEMGLEE, YFGN,) 100 UNIT/ML Pen Inject 70 Units into the skin daily. 03/12/23     insulin lispro (HUMALOG KWIKPEN) 200 UNIT/ML KwikPen Inject 5-15 Units into the skin 3 (three) times daily. 03/12/23     insulin lispro (HUMALOG) 100 UNIT/ML  injection Inject 100 Units  into the skin daily via insulin pump 10/28/22     Insulin Pen Needle 31G X 8 MM MISC Use 4-5 times daily as directed 03/12/23     methocarbamol (ROBAXIN) 500 MG tablet Take 1 tablet (500 mg total) by mouth every 6-8 hours as needed for spasms 08/18/22     metoprolol tartrate (LOPRESSOR) 100 MG tablet Take 1 tablet (100 mg total) by mouth 2 (two) times daily with food 02/11/23     metoprolol tartrate (LOPRESSOR) 50 MG tablet Take 3 tablets (150 mg total) by mouth 2 (two) times daily. 07/07/22     ondansetron (ZOFRAN) 4 MG tablet Take 1 tablet (4 mg total) by mouth 2 (two) times daily as needed. 03/23/23     predniSONE (DELTASONE) 10 MG tablet Take 4 tablets by mouth daily for 2 days, then 3 tablets daily for 2 days, then 2 tablets for 2 days, then 1 tablet daily for 2 days, then 1/2 tablet daily for 2 days 02/11/23     rosuvastatin (CRESTOR) 40 MG tablet Take 1 tablet (40 mg total) by mouth daily. 11/23/22   Nahser, Deloris Ping, MD  tirzepatide [facility]) 10 MG/0.5ML Pen Inject 10 mg into the skin once a week. 03/15/23     VASCEPA 1 g capsule Take 2 capsules (2 g total) by mouth 2 (two) times daily. 11/23/22   Nahser, Deloris Ping, MD  insulin glargine (SEMGLEE) 100 UNIT/ML injection Inject 65 units into the skin daily. 03/25/22 09/24/22      Physical Exam: Vitals:   06/03/23 2030 06/03/23 2100 06/03/23 2200 06/03/23 2239  BP: 134/77 128/70 (!) 140/80 (!) 153/81  Pulse: 100 97 (!) 102 97  Resp: 18 16 18 18   Temp:   99.1 F (37.3 C) 98.3 F (36.8 C)  TempSrc:   Oral Oral  SpO2: 100% 100% 98% 99%  Weight:    74.4 kg  Height:       Constitutional: Acutely ill looking NAD, calm, comfortable Eyes: PERRL, lids and conjunctivae normal ENMT: Mucous membranes are dry. Posterior pharynx clear of any exudate or lesions.Normal dentition.  Neck: normal, supple, no masses, no thyromegaly Respiratory: clear to auscultation bilaterally, no wheezing, no crackles. Normal respiratory effort. No  accessory muscle use.  Cardiovascular: Sinus tachycardia, no murmurs / rubs / gallops. No extremity edema. 2+ pedal pulses. No carotid bruits.  Abdomen: no tenderness, no masses palpated. No hepatosplenomegaly. Bowel sounds positive.  Musculoskeletal: Good range of motion, no joint swelling or tenderness, Skin: no rashes, lesions, ulcers. No induration Neurologic: CN 2-12 grossly intact. Sensation intact, DTR normal. Strength 5/5 in all 4.  Psychiatric: Normal judgment and insight. Alert and oriented x 3. Normal mood  Data Reviewed:  CO2 20 glucose 227 BUN 33 and creatinine 1.27 with a gap  of 16.  CBC entirely within normal.  Chest x-ray showed no active disease  Assessment and Plan:  #1 intractable nausea with vomiting: No obvious cause identified.  Patient is a diabetic could have gastroparesis.  This could also be a viral.  Could also be due to either causes including bacterial.  Patient admitted for symptoms control.  Aggressively hydrate.  If systems persist she may require gastric emptying studies at some point.  #2 type II diabetes: Patient will be on sliding scale insulin.  Monitor response closely.  Blood sugar stable.  She is going to be on clear liquid diet until full diet is resumed.  #3 hyperlipidemia: Will resume statin as soon as possible.  #4 essential hypertension: Blood pressure appears controlled.  Continue to monitor.  #5 morbid obesity: Continue with dietary counseling    Advance Care Planning:   Code Status: Full Code   Consults: None  Family Communication: No family at bedside  Severity of Illness: The appropriate patient status for this patient is INPATIENT. Inpatient status is judged to be reasonable and necessary in order to provide the required intensity of service to ensure the patient's safety. The patient's presenting symptoms, physical exam findings, and initial radiographic and laboratory data in the context of their chronic comorbidities is felt to  place them at high risk for further clinical deterioration. Furthermore, it is not anticipated that the patient will be medically stable for discharge from the hospital within 2 midnights of admission.   * I certify that at the point of admission it is my clinical judgment that the patient will require inpatient hospital care spanning beyond 2 midnights from the point of admission due to high intensity of service, high risk for further deterioration and high frequency of surveillance required.*  AuthorLonia Blood, MD 06/03/2023 11:38 PM  For on call review [email or web address].

## 2023-06-03 NOTE — ED Notes (Addendum)
Report given to [facility] at bedside

## 2023-06-04 ENCOUNTER — Observation Stay (HOSPITAL_COMMUNITY): Payer: Commercial Managed Care - PPO

## 2023-06-04 DIAGNOSIS — K56609 Unspecified intestinal obstruction, unspecified as to partial versus complete obstruction: Secondary | ICD-10-CM | POA: Diagnosis not present

## 2023-06-04 DIAGNOSIS — N183 Chronic kidney disease, stage 3 unspecified: Secondary | ICD-10-CM | POA: Diagnosis present

## 2023-06-04 DIAGNOSIS — R9431 Abnormal electrocardiogram [ECG] [EKG]: Secondary | ICD-10-CM | POA: Diagnosis not present

## 2023-06-04 DIAGNOSIS — N390 Urinary tract infection, site not specified: Secondary | ICD-10-CM | POA: Diagnosis present

## 2023-06-04 DIAGNOSIS — K219 Gastro-esophageal reflux disease without esophagitis: Secondary | ICD-10-CM | POA: Diagnosis present

## 2023-06-04 DIAGNOSIS — E1169 Type 2 diabetes mellitus with other specified complication: Secondary | ICD-10-CM | POA: Diagnosis present

## 2023-06-04 DIAGNOSIS — E782 Mixed hyperlipidemia: Secondary | ICD-10-CM | POA: Diagnosis present

## 2023-06-04 DIAGNOSIS — I129 Hypertensive chronic kidney disease with stage 1 through stage 4 chronic kidney disease, or unspecified chronic kidney disease: Secondary | ICD-10-CM | POA: Diagnosis present

## 2023-06-04 DIAGNOSIS — G2581 Restless legs syndrome: Secondary | ICD-10-CM | POA: Diagnosis present

## 2023-06-04 DIAGNOSIS — K529 Noninfective gastroenteritis and colitis, unspecified: Secondary | ICD-10-CM | POA: Diagnosis present

## 2023-06-04 DIAGNOSIS — M797 Fibromyalgia: Secondary | ICD-10-CM | POA: Diagnosis present

## 2023-06-04 DIAGNOSIS — Z79899 Other long term (current) drug therapy: Secondary | ICD-10-CM | POA: Diagnosis not present

## 2023-06-04 DIAGNOSIS — E860 Dehydration: Secondary | ICD-10-CM | POA: Diagnosis present

## 2023-06-04 DIAGNOSIS — E11649 Type 2 diabetes mellitus with hypoglycemia without coma: Secondary | ICD-10-CM | POA: Diagnosis not present

## 2023-06-04 DIAGNOSIS — Z794 Long term (current) use of insulin: Secondary | ICD-10-CM | POA: Diagnosis not present

## 2023-06-04 DIAGNOSIS — E876 Hypokalemia: Secondary | ICD-10-CM | POA: Diagnosis present

## 2023-06-04 DIAGNOSIS — Z9641 Presence of insulin pump (external) (internal): Secondary | ICD-10-CM | POA: Diagnosis present

## 2023-06-04 DIAGNOSIS — Z9049 Acquired absence of other specified parts of digestive tract: Secondary | ICD-10-CM | POA: Diagnosis not present

## 2023-06-04 DIAGNOSIS — Z7985 Long-term (current) use of injectable non-insulin antidiabetic drugs: Secondary | ICD-10-CM | POA: Diagnosis not present

## 2023-06-04 DIAGNOSIS — R112 Nausea with vomiting, unspecified: Secondary | ICD-10-CM | POA: Diagnosis present

## 2023-06-04 DIAGNOSIS — R7989 Other specified abnormal findings of blood chemistry: Secondary | ICD-10-CM | POA: Diagnosis not present

## 2023-06-04 DIAGNOSIS — E1122 Type 2 diabetes mellitus with diabetic chronic kidney disease: Secondary | ICD-10-CM | POA: Diagnosis present

## 2023-06-04 DIAGNOSIS — Z8249 Family history of ischemic heart disease and other diseases of the circulatory system: Secondary | ICD-10-CM | POA: Diagnosis not present

## 2023-06-04 DIAGNOSIS — Z888 Allergy status to other drugs, medicaments and biological substances status: Secondary | ICD-10-CM | POA: Diagnosis not present

## 2023-06-04 DIAGNOSIS — Z885 Allergy status to narcotic agent status: Secondary | ICD-10-CM | POA: Diagnosis not present

## 2023-06-04 DIAGNOSIS — E1165 Type 2 diabetes mellitus with hyperglycemia: Secondary | ICD-10-CM | POA: Diagnosis present

## 2023-06-04 LAB — HEPATIC FUNCTION PANEL
ALT: 16 U/L (ref 0–44)
AST: 18 U/L (ref 15–41)
Albumin: 3.7 g/dL (ref 3.5–5.0)
Alkaline Phosphatase: 43 U/L (ref 38–126)
Bilirubin, Direct: 0.1 mg/dL (ref 0.0–0.2)
Indirect Bilirubin: 0.7 mg/dL (ref 0.3–0.9)
Total Bilirubin: 0.8 mg/dL (ref 0.3–1.2)
Total Protein: 6.1 g/dL — ABNORMAL LOW (ref 6.5–8.1)

## 2023-06-04 LAB — COMPREHENSIVE METABOLIC PANEL
ALT: 18 U/L (ref 0–44)
AST: 18 U/L (ref 15–41)
Albumin: 4.2 g/dL (ref 3.5–5.0)
Alkaline Phosphatase: 48 U/L (ref 38–126)
Anion gap: 8 (ref 5–15)
BUN: 26 mg/dL — ABNORMAL HIGH (ref 6–20)
CO2: 22 mmol/L (ref 22–32)
Calcium: 8.7 mg/dL — ABNORMAL LOW (ref 8.9–10.3)
Chloride: 106 mmol/L (ref 98–111)
Creatinine, Ser: 0.98 mg/dL (ref 0.44–1.00)
GFR, Estimated: >60 mL/min (ref >60)
Glucose, Bld: 91 mg/dL (ref 70–99)
Potassium: 3.6 mmol/L (ref 3.5–5.1)
Sodium: 136 mmol/L (ref 135–145)
Total Bilirubin: 1.1 mg/dL (ref 0.3–1.2)
Total Protein: 6.4 g/dL — ABNORMAL LOW (ref 6.5–8.1)

## 2023-06-04 LAB — CBC
HCT: 33.1 % — ABNORMAL LOW (ref 36.0–46.0)
Hemoglobin: 11.2 g/dL — ABNORMAL LOW (ref 12.0–15.0)
MCH: 30.9 pg (ref 26.0–34.0)
MCHC: 33.8 g/dL (ref 30.0–36.0)
MCV: 91.4 fL (ref 80.0–100.0)
Platelets: 197 10*3/uL (ref 150–400)
RBC: 3.62 MIL/uL — ABNORMAL LOW (ref 3.87–5.11)
RDW: 12.6 % (ref 11.5–15.5)
WBC: 5.6 10*3/uL (ref 4.0–10.5)
nRBC: 0 % (ref 0.0–0.2)

## 2023-06-04 LAB — GLUCOSE, CAPILLARY
Glucose-Capillary: 66 mg/dL — ABNORMAL LOW (ref 70–99)
Glucose-Capillary: 115 mg/dL — ABNORMAL HIGH (ref 70–99)
Glucose-Capillary: 58 mg/dL — ABNORMAL LOW (ref 70–99)
Glucose-Capillary: 114 mg/dL — ABNORMAL HIGH (ref 70–99)
Glucose-Capillary: 80 mg/dL (ref 70–99)
Glucose-Capillary: 81 mg/dL (ref 70–99)

## 2023-06-04 LAB — MAGNESIUM: Magnesium: 1.5 mg/dL — ABNORMAL LOW (ref 1.7–2.4)

## 2023-06-04 LAB — URINALYSIS, W/ REFLEX TO CULTURE (INFECTION SUSPECTED)
Bilirubin Urine: NEGATIVE
Glucose, UA: NEGATIVE mg/dL
Ketones, ur: NEGATIVE mg/dL
Nitrite: NEGATIVE
Protein, ur: NEGATIVE mg/dL
Specific Gravity, Urine: 1.011 (ref 1.005–1.030)
pH: 5 (ref 5.0–8.0)

## 2023-06-04 LAB — HIV ANTIBODY (ROUTINE TESTING W REFLEX): HIV Screen 4th Generation wRfx: NONREACTIVE

## 2023-06-04 LAB — TROPONIN I (HIGH SENSITIVITY): Troponin I (High Sensitivity): 6 ng/L (ref <18)

## 2023-06-04 LAB — HEMOGLOBIN A1C
Hgb A1c MFr Bld: 5.8 % — ABNORMAL HIGH (ref 4.8–5.6)
Mean Plasma Glucose: 119.76 mg/dL

## 2023-06-04 LAB — LIPASE, BLOOD: Lipase: 44 U/L (ref 11–51)

## 2023-06-04 MED ORDER — METOPROLOL TARTRATE 50 MG PO TABS
100.0000 mg | ORAL_TABLET | Freq: Two times a day (BID) | ORAL | Status: DC
  Administered 2023-06-04 – 2023-06-10 (×13): 100 mg via ORAL
  Filled 2023-06-04 (×13): qty 2

## 2023-06-04 MED ORDER — LORAZEPAM 1 MG PO TABS
1.0000 mg | ORAL_TABLET | Freq: Once | ORAL | Status: AC
  Administered 2023-06-04: 1 mg via ORAL
  Filled 2023-06-04: qty 1

## 2023-06-04 MED ORDER — DEXTROSE 50 % IV SOLN
INTRAVENOUS | Status: AC
  Filled 2023-06-04: qty 50

## 2023-06-04 MED ORDER — DEXTROSE 50 % IV SOLN
12.5000 g | INTRAVENOUS | Status: AC
  Administered 2023-06-04: 12.5 g via INTRAVENOUS

## 2023-06-04 MED ORDER — PROCHLORPERAZINE EDISYLATE 10 MG/2ML IJ SOLN
10.0000 mg | Freq: Once | INTRAMUSCULAR | Status: AC
  Administered 2023-06-04: 10 mg via INTRAVENOUS
  Filled 2023-06-04: qty 2

## 2023-06-04 MED ORDER — ROSUVASTATIN CALCIUM 20 MG PO TABS
40.0000 mg | ORAL_TABLET | Freq: Every day | ORAL | Status: DC
  Administered 2023-06-04 – 2023-06-05 (×2): 40 mg via ORAL
  Filled 2023-06-04 (×2): qty 2

## 2023-06-04 MED ORDER — DEXTROSE-SODIUM CHLORIDE 5-0.9 % IV SOLN: INTRAVENOUS | Status: AC

## 2023-06-04 MED ORDER — POLYETHYLENE GLYCOL 3350 17 G PO PACK
17.0000 g | PACK | Freq: Every day | ORAL | Status: DC | PRN
  Administered 2023-06-04 – 2023-06-05 (×2): 17 g via ORAL
  Filled 2023-06-04 (×2): qty 1

## 2023-06-04 MED ORDER — MAGNESIUM SULFATE 4 GM/100ML IV SOLN
4.0000 g | Freq: Once | INTRAVENOUS | Status: AC
  Administered 2023-06-04: 4 g via INTRAVENOUS
  Filled 2023-06-04: qty 100

## 2023-06-04 MED ORDER — DEXTROSE 50 % IV SOLN
12.5000 g | INTRAVENOUS | Status: AC
  Administered 2023-06-04: 12.5 g via INTRAVENOUS
  Filled 2023-06-04: qty 50

## 2023-06-04 MED ORDER — FENOFIBRATE 54 MG PO TABS
54.0000 mg | ORAL_TABLET | Freq: Every day | ORAL | Status: DC
  Administered 2023-06-04: 54 mg via ORAL
  Filled 2023-06-04 (×3): qty 1

## 2023-06-04 MED ORDER — LORAZEPAM 0.5 MG PO TABS
0.5000 mg | ORAL_TABLET | Freq: Once | ORAL | Status: AC
  Administered 2023-06-04: 0.5 mg via ORAL
  Filled 2023-06-04: qty 1

## 2023-06-04 MED ORDER — ACETAMINOPHEN 325 MG PO TABS
650.0000 mg | ORAL_TABLET | Freq: Four times a day (QID) | ORAL | Status: DC | PRN
  Administered 2023-06-04 – 2023-06-09 (×3): 650 mg via ORAL
  Filled 2023-06-04 (×3): qty 2

## 2023-06-04 NOTE — Plan of Care (Signed)
[facility] Tonsillectomy/Adenoidectomy, Postoperative PEDS care plan entered in error.

## 2023-06-04 NOTE — Plan of Care (Signed)
  Problem: Education: Goal: Understanding of post-operative needs will improve Outcome: Progressing   Problem: Education: Goal: Knowledge of General Education information will improve Description: Including pain rating scale, medication(s)/side effects and non-pharmacologic comfort measures Outcome: Progressing   Problem: Health Behavior/Discharge Planning: Goal: Ability to manage health-related needs will improve Outcome: Progressing

## 2023-06-05 ENCOUNTER — Inpatient Hospital Stay (HOSPITAL_COMMUNITY): Payer: Commercial Managed Care - PPO

## 2023-06-05 DIAGNOSIS — E782 Mixed hyperlipidemia: Secondary | ICD-10-CM

## 2023-06-05 DIAGNOSIS — E1169 Type 2 diabetes mellitus with other specified complication: Secondary | ICD-10-CM

## 2023-06-05 DIAGNOSIS — I2510 Atherosclerotic heart disease of native coronary artery without angina pectoris: Secondary | ICD-10-CM

## 2023-06-05 DIAGNOSIS — I10 Essential (primary) hypertension: Secondary | ICD-10-CM

## 2023-06-05 DIAGNOSIS — E11649 Type 2 diabetes mellitus with hypoglycemia without coma: Secondary | ICD-10-CM

## 2023-06-05 DIAGNOSIS — R112 Nausea with vomiting, unspecified: Secondary | ICD-10-CM | POA: Diagnosis not present

## 2023-06-05 DIAGNOSIS — R9431 Abnormal electrocardiogram [ECG] [EKG]: Secondary | ICD-10-CM | POA: Diagnosis not present

## 2023-06-05 DIAGNOSIS — Z794 Long term (current) use of insulin: Secondary | ICD-10-CM

## 2023-06-05 LAB — COMPREHENSIVE METABOLIC PANEL
ALT: 21 U/L (ref 0–44)
AST: 27 U/L (ref 15–41)
Albumin: 4.1 g/dL (ref 3.5–5.0)
Alkaline Phosphatase: 56 U/L (ref 38–126)
Anion gap: 11 (ref 5–15)
BUN: 14 mg/dL (ref 6–20)
CO2: 22 mmol/L (ref 22–32)
Calcium: 9.5 mg/dL (ref 8.9–10.3)
Chloride: 107 mmol/L (ref 98–111)
Creatinine, Ser: 0.81 mg/dL (ref 0.44–1.00)
GFR, Estimated: >60 mL/min (ref >60)
Glucose, Bld: 95 mg/dL (ref 70–99)
Potassium: 3.6 mmol/L (ref 3.5–5.1)
Sodium: 140 mmol/L (ref 135–145)
Total Bilirubin: 0.9 mg/dL (ref 0.3–1.2)
Total Protein: 6.8 g/dL (ref 6.5–8.1)

## 2023-06-05 LAB — CBC WITH DIFFERENTIAL/PLATELET
Abs Immature Granulocytes: 0.03 10*3/uL (ref 0.00–0.07)
Basophils Absolute: 0 10*3/uL (ref 0.0–0.1)
Basophils Relative: 0 %
Eosinophils Absolute: 0.1 10*3/uL (ref 0.0–0.5)
Eosinophils Relative: 1 %
HCT: 35.8 % — ABNORMAL LOW (ref 36.0–46.0)
Hemoglobin: 12.3 g/dL (ref 12.0–15.0)
Immature Granulocytes: 0 %
Lymphocytes Relative: 16 %
Lymphs Abs: 1.6 10*3/uL (ref 0.7–4.0)
MCH: 32 pg (ref 26.0–34.0)
MCHC: 34.4 g/dL (ref 30.0–36.0)
MCV: 93.2 fL (ref 80.0–100.0)
Monocytes Absolute: 0.4 10*3/uL (ref 0.1–1.0)
Monocytes Relative: 4 %
Neutro Abs: 7.7 10*3/uL (ref 1.7–7.7)
Neutrophils Relative %: 79 %
Platelets: 209 10*3/uL (ref 150–400)
RBC: 3.84 MIL/uL — ABNORMAL LOW (ref 3.87–5.11)
RDW: 12.7 % (ref 11.5–15.5)
WBC: 9.8 10*3/uL (ref 4.0–10.5)
nRBC: 0 % (ref 0.0–0.2)

## 2023-06-05 LAB — GLUCOSE, CAPILLARY
Glucose-Capillary: 74 mg/dL (ref 70–99)
Glucose-Capillary: 86 mg/dL (ref 70–99)
Glucose-Capillary: 75 mg/dL (ref 70–99)
Glucose-Capillary: 88 mg/dL (ref 70–99)

## 2023-06-05 LAB — MAGNESIUM: Magnesium: 2.1 mg/dL (ref 1.7–2.4)

## 2023-06-05 MED ORDER — PROCHLORPERAZINE EDISYLATE 10 MG/2ML IJ SOLN
5.0000 mg | Freq: Four times a day (QID) | INTRAMUSCULAR | Status: DC | PRN
  Administered 2023-06-05: 5 mg via INTRAVENOUS
  Filled 2023-06-05: qty 2

## 2023-06-05 MED ORDER — METRONIDAZOLE 500 MG PO TABS
500.0000 mg | ORAL_TABLET | Freq: Two times a day (BID) | ORAL | Status: DC
  Administered 2023-06-06 – 2023-06-10 (×10): 500 mg via ORAL
  Filled 2023-06-05 (×10): qty 1

## 2023-06-05 MED ORDER — SODIUM CHLORIDE 0.9 % IV SOLN
1.0000 g | Freq: Every day | INTRAVENOUS | Status: DC
  Administered 2023-06-05 – 2023-06-10 (×6): 1 g via INTRAVENOUS
  Filled 2023-06-05 (×6): qty 10

## 2023-06-05 MED ORDER — IOHEXOL 300 MG/ML  SOLN
100.0000 mL | Freq: Once | INTRAMUSCULAR | Status: AC | PRN
  Administered 2023-06-05: 100 mL via INTRAVENOUS

## 2023-06-05 MED ORDER — LORAZEPAM 1 MG PO TABS
1.0000 mg | ORAL_TABLET | Freq: Once | ORAL | Status: AC
  Administered 2023-06-05: 1 mg via ORAL
  Filled 2023-06-05: qty 1

## 2023-06-05 MED ORDER — PROCHLORPERAZINE EDISYLATE 10 MG/2ML IJ SOLN
10.0000 mg | Freq: Four times a day (QID) | INTRAMUSCULAR | Status: AC | PRN
  Administered 2023-06-05: 10 mg via INTRAVENOUS
  Filled 2023-06-05: qty 2

## 2023-06-05 MED ORDER — PROCHLORPERAZINE EDISYLATE 10 MG/2ML IJ SOLN
10.0000 mg | Freq: Four times a day (QID) | INTRAMUSCULAR | Status: DC | PRN
  Administered 2023-06-05: 10 mg via INTRAVENOUS
  Filled 2023-06-05 (×2): qty 2

## 2023-06-05 MED ORDER — IOHEXOL 9 MG/ML PO SOLN
ORAL | Status: AC
  Filled 2023-06-05: qty 1000

## 2023-06-05 MED ORDER — SODIUM CHLORIDE (PF) 0.9 % IJ SOLN
INTRAMUSCULAR | Status: AC
  Filled 2023-06-05: qty 50

## 2023-06-05 MED ORDER — IOHEXOL 9 MG/ML PO SOLN
500.0000 mL | ORAL | Status: AC
  Administered 2023-06-05: 500 mL via ORAL

## 2023-06-05 NOTE — Plan of Care (Signed)

## 2023-06-06 ENCOUNTER — Inpatient Hospital Stay (HOSPITAL_COMMUNITY): Payer: Commercial Managed Care - PPO

## 2023-06-06 DIAGNOSIS — R112 Nausea with vomiting, unspecified: Secondary | ICD-10-CM | POA: Diagnosis not present

## 2023-06-06 DIAGNOSIS — I2510 Atherosclerotic heart disease of native coronary artery without angina pectoris: Secondary | ICD-10-CM | POA: Diagnosis present

## 2023-06-06 DIAGNOSIS — Z794 Long term (current) use of insulin: Secondary | ICD-10-CM

## 2023-06-06 DIAGNOSIS — R9431 Abnormal electrocardiogram [ECG] [EKG]: Secondary | ICD-10-CM | POA: Diagnosis present

## 2023-06-06 DIAGNOSIS — I10 Essential (primary) hypertension: Secondary | ICD-10-CM | POA: Diagnosis present

## 2023-06-06 DIAGNOSIS — R7989 Other specified abnormal findings of blood chemistry: Secondary | ICD-10-CM

## 2023-06-06 DIAGNOSIS — E1169 Type 2 diabetes mellitus with other specified complication: Secondary | ICD-10-CM | POA: Diagnosis present

## 2023-06-06 DIAGNOSIS — E11649 Type 2 diabetes mellitus with hypoglycemia without coma: Secondary | ICD-10-CM

## 2023-06-06 LAB — COMPREHENSIVE METABOLIC PANEL
ALT: 171 U/L — ABNORMAL HIGH (ref 0–44)
AST: 207 U/L — ABNORMAL HIGH (ref 15–41)
Albumin: 3.4 g/dL — ABNORMAL LOW (ref 3.5–5.0)
Alkaline Phosphatase: 164 U/L — ABNORMAL HIGH (ref 38–126)
Anion gap: 8 (ref 5–15)
BUN: 14 mg/dL (ref 6–20)
CO2: 22 mmol/L (ref 22–32)
Calcium: 8.9 mg/dL (ref 8.9–10.3)
Chloride: 107 mmol/L (ref 98–111)
Creatinine, Ser: 0.85 mg/dL (ref 0.44–1.00)
GFR, Estimated: >60 mL/min (ref >60)
Glucose, Bld: 74 mg/dL (ref 70–99)
Potassium: 3.6 mmol/L (ref 3.5–5.1)
Sodium: 137 mmol/L (ref 135–145)
Total Bilirubin: 1.1 mg/dL (ref 0.3–1.2)
Total Protein: 6 g/dL — ABNORMAL LOW (ref 6.5–8.1)

## 2023-06-06 LAB — CBC WITH DIFFERENTIAL/PLATELET
Abs Immature Granulocytes: 0.01 10*3/uL (ref 0.00–0.07)
Basophils Absolute: 0 10*3/uL (ref 0.0–0.1)
Basophils Relative: 0 %
Eosinophils Absolute: 0 10*3/uL (ref 0.0–0.5)
Eosinophils Relative: 1 %
HCT: 30.3 % — ABNORMAL LOW (ref 36.0–46.0)
Hemoglobin: 10.4 g/dL — ABNORMAL LOW (ref 12.0–15.0)
Immature Granulocytes: 0 %
Lymphocytes Relative: 18 %
Lymphs Abs: 1.3 10*3/uL (ref 0.7–4.0)
MCH: 31.7 pg (ref 26.0–34.0)
MCHC: 34.3 g/dL (ref 30.0–36.0)
MCV: 92.4 fL (ref 80.0–100.0)
Monocytes Absolute: 0.3 10*3/uL (ref 0.1–1.0)
Monocytes Relative: 5 %
Neutro Abs: 5.3 10*3/uL (ref 1.7–7.7)
Neutrophils Relative %: 76 %
Platelets: 153 10*3/uL (ref 150–400)
RBC: 3.28 MIL/uL — ABNORMAL LOW (ref 3.87–5.11)
RDW: 12.7 % (ref 11.5–15.5)
WBC: 6.9 10*3/uL (ref 4.0–10.5)
nRBC: 0 % (ref 0.0–0.2)

## 2023-06-06 LAB — FOLATE: Folate: 8.8 ng/mL (ref >5.9)

## 2023-06-06 LAB — IRON AND TIBC
Iron: 26 ug/dL — ABNORMAL LOW (ref 28–170)
Saturation Ratios: 11 % (ref 10.4–31.8)
TIBC: 237 ug/dL — ABNORMAL LOW (ref 250–450)
UIBC: 211 ug/dL

## 2023-06-06 LAB — VITAMIN B12: Vitamin B-12: 406 pg/mL (ref 180–914)

## 2023-06-06 LAB — GLUCOSE, CAPILLARY
Glucose-Capillary: 63 mg/dL — ABNORMAL LOW (ref 70–99)
Glucose-Capillary: 115 mg/dL — ABNORMAL HIGH (ref 70–99)
Glucose-Capillary: 88 mg/dL (ref 70–99)
Glucose-Capillary: 105 mg/dL — ABNORMAL HIGH (ref 70–99)
Glucose-Capillary: 86 mg/dL (ref 70–99)

## 2023-06-06 LAB — LIPASE, BLOOD: Lipase: 35 U/L (ref 11–51)

## 2023-06-06 LAB — C-REACTIVE PROTEIN: CRP: 2.5 mg/dL — ABNORMAL HIGH (ref <1.0)

## 2023-06-06 LAB — FERRITIN: Ferritin: 442 ng/mL — ABNORMAL HIGH (ref 11–307)

## 2023-06-06 LAB — MAGNESIUM: Magnesium: 1.7 mg/dL (ref 1.7–2.4)

## 2023-06-06 LAB — CK: Total CK: 26 U/L — ABNORMAL LOW (ref 38–234)

## 2023-06-06 MED ORDER — DEXTROSE 50 % IV SOLN
12.5000 g | INTRAVENOUS | Status: AC
  Administered 2023-06-06: 12.5 g via INTRAVENOUS
  Filled 2023-06-06: qty 50

## 2023-06-06 MED ORDER — PROCHLORPERAZINE EDISYLATE 10 MG/2ML IJ SOLN
10.0000 mg | Freq: Four times a day (QID) | INTRAMUSCULAR | Status: DC | PRN
  Administered 2023-06-06 – 2023-06-10 (×7): 10 mg via INTRAVENOUS
  Filled 2023-06-06 (×7): qty 2

## 2023-06-06 MED ORDER — ICOSAPENT ETHYL 1 G PO CAPS
2.0000 g | ORAL_CAPSULE | Freq: Two times a day (BID) | ORAL | Status: DC
  Filled 2023-06-06 (×9): qty 2

## 2023-06-06 MED ORDER — LORAZEPAM 1 MG PO TABS
1.0000 mg | ORAL_TABLET | Freq: Once | ORAL | Status: AC
  Administered 2023-06-06: 1 mg via ORAL
  Filled 2023-06-06: qty 1

## 2023-06-06 MED ORDER — ONDANSETRON HCL 4 MG/2ML IJ SOLN
4.0000 mg | Freq: Four times a day (QID) | INTRAMUSCULAR | Status: DC | PRN
  Administered 2023-06-07 – 2023-06-08 (×4): 4 mg via INTRAVENOUS
  Filled 2023-06-06 (×4): qty 2

## 2023-06-06 MED ORDER — KCL IN DEXTROSE-NACL 20-5-0.9 MEQ/L-%-% IV SOLN
INTRAVENOUS | Status: AC
  Filled 2023-06-06 (×3): qty 1000

## 2023-06-06 MED ORDER — MAGNESIUM SULFATE 2 GM/50ML IV SOLN
2.0000 g | Freq: Once | INTRAVENOUS | Status: AC
  Administered 2023-06-06: 2 g via INTRAVENOUS
  Filled 2023-06-06: qty 50

## 2023-06-06 MED ORDER — HYDRALAZINE HCL 20 MG/ML IJ SOLN: 10.0000 mg | Freq: Four times a day (QID) | INTRAMUSCULAR | Status: DC | PRN

## 2023-06-06 NOTE — Assessment & Plan Note (Signed)
.   Patient is currently chest pain free . Monitoring patient on telemetry . Continue home regimen of  lipid lowering therapy and AV nodal blocking therapy

## 2023-06-06 NOTE — Progress Notes (Signed)
PROGRESS NOTE   NYSHA Campos  WUJ:[id number] DOB: Nov 14, 1965 DOA: 06/03/2023 PCP: [facility]   Date of Service: the patient was seen and examined on 06/05/2023  Brief Narrative:  57 year old female with past medical history of insulin-dependent diabetes mellitus type 2, hyperlipidemia, hypertension, nonobstructive coronary artery disease who presented to [facility] emergency department with several days of nausea vomiting and inability to tolerate oral intake.  The hospital group was called and patient was accepted for transfer to [facility] for continued medical management after patient continued to not be able to tolerate oral intake.  Patient has been managed with intravenous volume resuscitation and antiemetics with a concurrent workup for various etiologies.   Assessment and Plan: * Intractable nausea and vomiting Symptoms continuing to persist making the explanation of gastroenteritis less likely Diabetic gastroparesis is also unlikely considering how acute the symptoms are and how well-controlled the patient's diabetes has historically been a hemoglobin A1c of 5.8%. Continue intravenous volume resuscitation Clear liquid diet and advance as tolerated Lipase, hepatic function panel unremarkable. Urinalysis somewhat abnormal, obtaining urine culture Obtaining CT imaging of the abdomen and pelvis.  Uncontrolled type 2 diabetes mellitus with hypoglycemia, with long-term current use of insulin (HCC) Patient can sinew to exhibit bouts of hypoglycemia albeit improved.   Patient is typically on Mounjaro which is likely still in her system and with her poor oral intake hypoglycemia is being precipitated If hypoglycemia persists will place on D5 infusion Will advance diet as tolerated Regular blood sugar checks with hypoglycemia protocol  Prolonged QT interval Improved QTc today after temporary reprieve from QT prolonging agents and repletion  of magnesium  Will transition back to as needed Compazine for nausea  Continuing to monitor on telemetry   Hypomagnesemia replaced  Coronary artery disease involving native coronary artery of native heart without angina pectoris Patient is currently chest pain free Monitoring patient on telemetry Continue home regimen of  lipid lowering therapy and AV nodal blocking therapy   Essential hypertension Continue home regimen of metoprolol As needed intravenous hydralazine for markedly elevated blood pressures.  Mixed diabetic hyperlipidemia associated with type 2 diabetes mellitus (HCC) Resuming home regimen of Crestor and Tricor    Subjective:  Patient continuing to complain of nausea and inability to tolerate oral intake consistently along with intermittent bouts of right abdominal and flank pain.  Patient describes these episodes of pain is mild to moderate intensity, slightly sharp in quality.  Patient denies any associated diarrhea.  Physical Exam:  Vitals:   06/05/23 1151 06/05/23 1520    BP: 125/70 108/61    Pulse: 77 78    Resp: 20 15    Temp: 98.1 F (36.7 C) 98.1 F (36.7 C)    TempSrc: Oral Oral    SpO2: 95% 97%    Weight:      Height:        Constitutional: Awake alert and oriented x3, no associated distress.   Skin: no rashes, no lesions, good skin turgor noted. Eyes: Pupils are equally reactive to light.  No evidence of scleral icterus or conjunctival pallor.  ENMT: Moist mucous membranes noted.  Posterior pharynx clear of any exudate or lesions.   Respiratory: clear to auscultation bilaterally, no wheezing, no crackles. Normal respiratory effort. No accessory muscle use.  Cardiovascular: Regular rate and rhythm, no murmurs / rubs / gallops. No extremity edema. 2+ pedal pulses. No carotid bruits.  Abdomen: Mild right-sided abdominal tenderness.  Abdomen is soft.  No evidence  of intra-abdominal masses.  Positive bowel sounds noted in all quadrants.    Musculoskeletal: No joint deformity upper and lower extremities. Good ROM, no contractures. Normal muscle tone.    Data Reviewed:  I have personally reviewed and interpreted labs, imaging.  Significant findings are   CBC: Recent Labs  Lab 06/03/23 1405 06/04/23 0118 06/05/23 1022   WBC 8.7 5.6 9.8   NEUTROABS  --   --  7.7   HGB 14.1 11.2* 12.3   HCT 39.9 33.1* 35.8*   MCV 87.3 91.4 93.2   PLT 275 197 209    Basic Metabolic Panel: Recent Labs  Lab 06/03/23 1405 06/04/23 0118 06/04/23 1708 06/05/23 1022   NA 138 136  --  140   K 4.0 3.6  --  3.6   CL 102 106  --  107   CO2 20* 22  --  22   GLUCOSE 227* 91  --  95   BUN 33* 26*  --  14   CREATININE 1.27* 0.98  --  0.81   CALCIUM 10.3 8.7*  --  9.5   MG  --   --  1.5* 2.1    GFR: Estimated Creatinine Clearance: 79 mL/min (by C-G formula based on SCr of 0.85 mg/dL). Liver Function Tests: Recent Labs  Lab 06/04/23 0118 06/04/23 1112 06/05/23 1022   AST 18 18 27    ALT 18 16 21    ALKPHOS 48 43 56   BILITOT 1.1 0.8 0.9   PROT 6.4* 6.1* 6.8   ALBUMIN 4.2 3.7 4.1     EKG/: Personally reviewed.  Rhythm is normal sinus rhythm with heart rate of 78 bpm with QTc of 485 ms.  No dynamic ST segment changes appreciated.   Code Status:  Full code.  Code status decision has been confirmed with: patient    Severity of Illness:  The appropriate patient status for this patient is INPATIENT. Inpatient status is judged to be reasonable and necessary in order to provide the required intensity of service to ensure the patient's safety. The patient's presenting symptoms, physical exam findings, and initial radiographic and laboratory data in the context of their chronic comorbidities is felt to place them at high risk for further clinical deterioration. Furthermore, it is not anticipated that the patient will be medically stable for discharge from the hospital within 2 midnights of admission.   * I certify that at the point of  admission it is my clinical judgment that the patient will require inpatient hospital care spanning beyond 2 midnights from the point of admission due to high intensity of service, high risk for further deterioration and high frequency of surveillance required.*  Time spent:  54 minutes  Author:  Marinda Elk MD  06/06/2023 8:48 AM

## 2023-06-06 NOTE — Assessment & Plan Note (Addendum)
Symptoms continuing to persist making the explanation of gastroenteritis less likely Diabetic gastroparesis is also unlikely considering how acute the symptoms are and how well-controlled the patient's diabetes has historically been a hemoglobin A1c of 5.8%. Continue intravenous volume resuscitation Clear liquid diet and advance as tolerated Lipase, hepatic function panel unremarkable. Urinalysis somewhat abnormal, obtaining urine culture Obtaining CT imaging of the abdomen and pelvis.

## 2023-06-06 NOTE — Progress Notes (Incomplete)
PROGRESS NOTE   Alexis Campos  ZOX:[id number] DOB: [birth year] DOA: 06/03/2023 PCP: [facility]   Date of Service: the patient was seen and examined on 06/06/2023  Brief Narrative:  57 year old female with past medical history of insulin-dependent diabetes mellitus type 2, hyperlipidemia, hypertension, nonobstructive coronary artery disease who presented to [facility] emergency department with several days of nausea vomiting and inability to tolerate oral intake.  The hospital group was called and patient was accepted for transfer to [facility] for continued medical management after patient continued to not be able to tolerate oral intake.  Patient has been managed with intravenous volume resuscitation and antiemetics with a concurrent workup for various etiologies.     Assessment and Plan: * Intractable nausea and vomiting Symptoms continuing to persist making the explanation of gastroenteritis less likely Diabetic gastroparesis is also unlikely considering how acute the symptoms are and how well-controlled the patient's diabetes has historically been a hemoglobin A1c of 5.8%. Continue intravenous volume resuscitation Clear liquid diet and advance as tolerated Lipase, hepatic function panel unremarkable. Urinalysis somewhat abnormal, obtaining urine culture Obtaining CT imaging of the abdomen and pelvis.  Uncontrolled type 2 diabetes mellitus with hypoglycemia, with long-term current use of insulin (HCC) Patient can sinew to exhibit bouts of hypoglycemia albeit improved.   Patient is typically on Mounjaro which is likely still in her system and with her poor oral intake hypoglycemia is being precipitated If hypoglycemia persists will place on D5 infusion Will advance diet as tolerated Regular blood sugar checks with hypoglycemia protocol  Prolonged QT interval Improved QTc today after temporary reprieve from QT prolonging agents and  repletion of magnesium  Will transition back to as needed Compazine for nausea  Continuing to monitor on telemetry   Hypomagnesemia replaced  Coronary artery disease involving native coronary artery of native heart without angina pectoris Patient is currently chest pain free Monitoring patient on telemetry Continue home regimen of  lipid lowering therapy and AV nodal blocking therapy   Essential hypertension Continue home regimen of metoprolol As needed intravenous hydralazine for markedly elevated blood pressures.  Mixed diabetic hyperlipidemia associated with type 2 diabetes mellitus (HCC) Resuming home regimen of Crestor and Tricor        Subjective:  ***  Physical Exam:  Vitals:   06/05/23 1151 06/05/23 1520 06/05/23 2146 06/06/23 0515  BP: 125/70 108/61 101/65 (!) 99/52  Pulse: 77 78 79 77  Resp: 20 15 14    Temp: 98.1 F (36.7 C) 98.1 F (36.7 C) 98 F (36.7 C) 98.2 F (36.8 C)  TempSrc: Oral Oral Oral Oral  SpO2: 95% 97% 96% 98%  Weight:      Height:        *** Constitutional: Awake alert and oriented x3, no associated distress.   Skin: no rashes, no lesions, good skin turgor noted. Eyes: Pupils are equally reactive to light.  No evidence of scleral icterus or conjunctival pallor.  ENMT: Moist mucous membranes noted.  Posterior pharynx clear of any exudate or lesions.   Respiratory: clear to auscultation bilaterally, no wheezing, no crackles. Normal respiratory effort. No accessory muscle use.  Cardiovascular: Regular rate and rhythm, no murmurs / rubs / gallops. No extremity edema. 2+ pedal pulses. No carotid bruits.  Abdomen: Abdomen is soft and nontender.  No evidence of intra-abdominal masses.  Positive bowel sounds noted in all quadrants.   Musculoskeletal: No joint deformity upper and lower extremities. Good ROM, no contractures. Normal muscle tone.  Data Reviewed:  I have personally reviewed and interpreted labs, imaging.  Significant  findings are ***  CBC: Recent Labs  Lab 06/03/23 1405 06/04/23 0118 06/05/23 1022 06/06/23 0604  WBC 8.7 5.6 9.8 6.9  NEUTROABS  --   --  7.7 5.3  HGB 14.1 11.2* 12.3 10.4*  HCT 39.9 33.1* 35.8* 30.3*  MCV 87.3 91.4 93.2 92.4  PLT 275 197 209 153   Basic Metabolic Panel: Recent Labs  Lab 06/03/23 1405 06/04/23 0118 06/04/23 1708 06/05/23 1022 06/06/23 0604  NA 138 136  --  140 137  K 4.0 3.6  --  3.6 3.6  CL 102 106  --  107 107  CO2 20* 22  --  22 22  GLUCOSE 227* 91  --  95 74  BUN 33* 26*  --  14 14  CREATININE 1.27* 0.98  --  0.81 0.85  CALCIUM 10.3 8.7*  --  9.5 8.9  MG  --   --  1.5* 2.1 1.7   GFR: Estimated Creatinine Clearance: 79 mL/min (by C-G formula based on SCr of 0.85 mg/dL). Liver Function Tests: Recent Labs  Lab 06/04/23 0118 06/04/23 1112 06/05/23 1022 06/06/23 0604  AST 18 18 27  207*  ALT 18 16 21  171*  ALKPHOS 48 43 56 164*  BILITOT 1.1 0.8 0.9 1.1  PROT 6.4* 6.1* 6.8 6.0*  ALBUMIN 4.2 3.7 4.1 3.4*    Coagulation Profile: No results for input(s): "INR", "PROTIME" in the last 168 hours.   EKG/Telemetry: Personally reviewed.  Rhythm is *** with heart rate of ***.  No dynamic ST segment changes appreciated.   Code Status:  {Palliative Code status:23503}.  Code status decision has been confirmed with: *** Family Communication: ***    Severity of Illness:  {Observation/Inpatient:21159}  Time spent:  *** minutes  Author:  Marinda Elk MD  06/06/2023 8:51 AM

## 2023-06-06 NOTE — Plan of Care (Signed)

## 2023-06-06 NOTE — Assessment & Plan Note (Signed)
Resuming home regimen of Crestor and Tricor

## 2023-06-06 NOTE — Assessment & Plan Note (Signed)
Continue home regimen of metoprolol As needed intravenous hydralazine for markedly elevated blood pressures.

## 2023-06-06 NOTE — Progress Notes (Signed)
PROGRESS NOTE   Alexis Campos  [id number]:[id number] DOB: 07-Dec-1965 DOA: 06/03/2023 PCP: [facility]   Date of Service: the patient was seen and examined on 06/04/2023  Brief Narrative:  57 year old female with past medical history of insulin-dependent diabetes mellitus type 2, hyperlipidemia, hypertension, nonobstructive coronary artery disease who presented to [facility] emergency department with several days of nausea vomiting and inability to tolerate oral intake.  The hospital group was called and patient was accepted for transfer to [facility] for continued medical management after patient continued to not be able to tolerate oral intake.  Patient has been managed with intravenous volume resuscitation and antiemetics with a concurrent workup for various etiologies.  Assessment and Plan: * Intractable nausea and vomiting Etiology unclear Possibly gastroenteritis Diabetic gastroparesis is also possible but unlikely considering how acute the symptoms are and how well-controlled the patient's diabetes is with a hemoglobin A1c of 5.8%. Continue intravenous volume resuscitation Clear liquid diet and advance as tolerated Obtaining lipase, hepatic function panel, urinalysis  Uncontrolled type 2 diabetes mellitus with hypoglycemia, with long-term current use of insulin (HCC) Patient exhibiting multiple bouts of hypoglycemia today.  Patient is typically on Mounjaro which is likely still in her system and with her poor oral intake hypoglycemia is being precipitated Will advance diet as tolerated Regular blood sugar checks with hypoglycemia protocol  Prolonged QT interval Notable QTc prolongation on telemetry and EKG in excess of 500 3 secondary to a combination of QT prolonging antiemetics and hypomagnesemia Temporarily giving patient as needed Ativan for symptoms Replacing magnesium Monitoring on telemetry  Hypomagnesemia Replacing with 4 g of  magnesium sulfate, will reassess  Coronary artery disease involving native coronary artery of native heart without angina pectoris Patient is currently chest pain free Monitoring patient on telemetry Continue home regimen of  lipid lowering therapy and AV nodal blocking therapy   Essential hypertension Continue home regimen of metoprolol As needed intravenous hydralazine for markedly elevated blood pressures.  Mixed diabetic hyperlipidemia associated with type 2 diabetes mellitus (HCC) Resuming home regimen of Crestor and Tricor    Subjective:  Patient continues to complain of severe unrelenting nausea with occasional bouts of nonbilious nonbloody vomiting.  Patient does planing of occasional "colicky" right flank pain.  Patient denies any dysuria or diarrhea.  Patient reports a lack of appetite.  Physical Exam:  Temp 98.49F BP 121/83 HR 78 RR 20  Constitutional: Awake alert and oriented x3, no associated distress.   Skin: no rashes, no lesions, poor skin turgor noted. Eyes: Pupils are equally reactive to light.  No evidence of scleral icterus or conjunctival pallor.  ENMT: Dry mucous membranes noted.  Posterior pharynx clear of any exudate or lesions.   Respiratory: clear to auscultation bilaterally, no wheezing, no crackles. Normal respiratory effort. No accessory muscle use.  Cardiovascular: Regular rate and rhythm, no murmurs / rubs / gallops. No extremity edema. 2+ pedal pulses. No carotid bruits.  Abdomen: Vague right-sided tenderness. Soft. No evidence of intra-abdominal masses.  Positive bowel sounds noted in all quadrants.   Musculoskeletal: No joint deformity upper and lower extremities. Good ROM, no contractures. Normal muscle tone.    Data Reviewed:  I have personally reviewed and interpreted labs, imaging.  Significant findings are   CBC: Recent Labs  Lab 06/03/23 1405 06/04/23 0118    WBC 8.7 5.6    NEUTROABS  --   --     HGB 14.1 11.2*    HCT 39.9 33.1*  MCV 87.3 91.4    PLT 275 197     Basic Metabolic Panel: Recent Labs  Lab 06/03/23 1405 06/04/23 0118 06/04/23 1708    NA 138 136  --     K 4.0 3.6  --     CL 102 106  --     CO2 20* 22  --     GLUCOSE 227* 91  --     BUN 33* 26*  --     CREATININE 1.27* 0.98  --     CALCIUM 10.3 8.7*  --     MG  --   --  1.5*     GFR: Estimated Creatinine Clearance: 79 mL/min (by C-G formula based on SCr of 0.85 mg/dL). Liver Function Tests: Recent Labs  Lab 06/04/23 0118 06/04/23 1112    AST 18 18    ALT 18 16    ALKPHOS 48 43    BILITOT 1.1 0.8    PROT 6.4* 6.1*    ALBUMIN 4.2 3.7      EKG/Telemetry: Personally reviewed.  Rhythm is normal sinus rhythm with heart rate of 75 bpm.  No dynamic ST segment changes appreciated.   Code Status:  Full code.  Code status decision has been confirmed with: patient    Severity of Illness:  The appropriate patient status for this patient is INPATIENT. Inpatient status is judged to be reasonable and necessary in order to provide the required intensity of service to ensure the patient's safety. The patient's presenting symptoms, physical exam findings, and initial radiographic and laboratory data in the context of their chronic comorbidities is felt to place them at high risk for further clinical deterioration. Furthermore, it is not anticipated that the patient will be medically stable for discharge from the hospital within 2 midnights of admission.   * I certify that at the point of admission it is my clinical judgment that the patient will require inpatient hospital care spanning beyond 2 midnights from the point of admission due to high intensity of service, high risk for further deterioration and high frequency of surveillance required.*  Time spent:  60 minutes  Author:  Marinda Elk MD  06/04/2023

## 2023-06-06 NOTE — Assessment & Plan Note (Addendum)
replaced

## 2023-06-06 NOTE — Hospital Course (Signed)
57 year old female with past medical history of insulin-dependent diabetes mellitus type 2, hyperlipidemia, hypertension, nonobstructive coronary artery disease who presented to [facility] emergency department with several days of nausea vomiting and inability to tolerate oral intake.  The hospital group was called and patient was accepted for transfer to [facility] for continued medical management after patient continued to not be able to tolerate oral intake.  Patient has been managed with intravenous volume resuscitation and antiemetics with a concurrent workup for various etiologies.

## 2023-06-06 NOTE — Assessment & Plan Note (Addendum)
Patient continuing to exhibit bouts of hypoglycemia albeit improved.   Last Greggory Keen was about 2 weeks ago making residual effects on the body very unlikely. Continue D5 normal saline infusion. Will advance diet as tolerated Regular blood sugar checks with hypoglycemia protocol

## 2023-06-06 NOTE — Assessment & Plan Note (Addendum)
Prolonged QTc earlier in the hospitalization thought to be secondary to multiple QT prolonging agents and electrolyte derangement  Now resolved after administration of electrolytes  Will transition back to zofran and Compazine for nausea  Continuing to monitor on telemetry

## 2023-06-07 DIAGNOSIS — R112 Nausea with vomiting, unspecified: Secondary | ICD-10-CM | POA: Diagnosis not present

## 2023-06-07 DIAGNOSIS — R7989 Other specified abnormal findings of blood chemistry: Secondary | ICD-10-CM | POA: Diagnosis present

## 2023-06-07 LAB — COMPREHENSIVE METABOLIC PANEL
ALT: 189 U/L — ABNORMAL HIGH (ref 0–44)
AST: 146 U/L — ABNORMAL HIGH (ref 15–41)
Albumin: 3.6 g/dL (ref 3.5–5.0)
Alkaline Phosphatase: 269 U/L — ABNORMAL HIGH (ref 38–126)
Anion gap: 8 (ref 5–15)
BUN: 10 mg/dL (ref 6–20)
CO2: 23 mmol/L (ref 22–32)
Calcium: 8.6 mg/dL — ABNORMAL LOW (ref 8.9–10.3)
Chloride: 106 mmol/L (ref 98–111)
Creatinine, Ser: 0.9 mg/dL (ref 0.44–1.00)
GFR, Estimated: >60 mL/min (ref >60)
Glucose, Bld: 114 mg/dL — ABNORMAL HIGH (ref 70–99)
Potassium: 3.4 mmol/L — ABNORMAL LOW (ref 3.5–5.1)
Sodium: 137 mmol/L (ref 135–145)
Total Bilirubin: 0.7 mg/dL (ref 0.3–1.2)
Total Protein: 6.2 g/dL — ABNORMAL LOW (ref 6.5–8.1)

## 2023-06-07 LAB — URINE CULTURE: Culture: <10000 — AB

## 2023-06-07 LAB — GLUCOSE, CAPILLARY
Glucose-Capillary: 106 mg/dL — ABNORMAL HIGH (ref 70–99)
Glucose-Capillary: 90 mg/dL (ref 70–99)
Glucose-Capillary: 93 mg/dL (ref 70–99)
Glucose-Capillary: 96 mg/dL (ref 70–99)

## 2023-06-07 LAB — HEPATITIS PANEL, ACUTE: Hepatitis B Surface Ag: NONREACTIVE

## 2023-06-07 LAB — OCCULT BLOOD X 1 CARD TO LAB, STOOL: Fecal Occult Bld: NEGATIVE

## 2023-06-07 MED ORDER — ENOXAPARIN SODIUM 40 MG/0.4ML IJ SOSY
40.0000 mg | PREFILLED_SYRINGE | INTRAMUSCULAR | Status: DC
  Administered 2023-06-07 – 2023-06-10 (×4): 40 mg via SUBCUTANEOUS
  Filled 2023-06-07 (×4): qty 0.4

## 2023-06-07 MED ORDER — METOCLOPRAMIDE HCL 5 MG/ML IJ SOLN: 5.0000 mg | Freq: Three times a day (TID) | INTRAMUSCULAR | Status: DC

## 2023-06-07 MED ORDER — MELATONIN 5 MG PO TABS
5.0000 mg | ORAL_TABLET | Freq: Every day | ORAL | Status: DC
  Administered 2023-06-07: 5 mg via ORAL
  Filled 2023-06-07: qty 1

## 2023-06-07 NOTE — Progress Notes (Addendum)
PROGRESS NOTE    Alexis Campos  JYN:[id number] DOB: 1966/07/22 DOA: 06/03/2023 PCP: [facility]   Brief Narrative:  57 year old female with past medical history of insulin-dependent diabetes mellitus type 2, hyperlipidemia, hypertension, nonobstructive coronary artery disease who presented to [facility] emergency department with several days of nausea vomiting and inability to tolerate oral intake.   The hospital group was called and patient was accepted for transfer to [facility] for continued medical management after patient continued to not be able to tolerate oral intake.   Patient has been managed with intravenous volume resuscitation and antiemetics with a concurrent workup for various etiologies.  Assessment & Plan:   Principal Problem:   Intractable nausea and vomiting Active Problems:   Abnormal LFTs   Prolonged QT interval   Uncontrolled type 2 diabetes mellitus with hypoglycemia, with long-term current use of insulin (HCC)   Hypomagnesemia   Coronary artery disease involving native coronary artery of native heart without angina pectoris   Essential hypertension   Mixed diabetic hyperlipidemia associated with type 2 diabetes mellitus (HCC)   Abdominal pain   Obesity (BMI 30-39.9)  Intractable nausea/vomiting/diarrhea/acute colitis: CT abdomen at the time of initial admission showed ascending colon colitis.  Patient has not had any vomiting but continues to remain nauseous even with clear liquid diet.  Now has started having diarrhea, 6 episodes in the last 12 hours.  Sometimes also complains of abdominal pain.  Nontender on abdominal examination.  Will check stool studies, ova and parasite.  Will proceed with gastric emptying study.  N.p.o. for the study.  On Rocephin and Flagyl.  Addendum: Informed by the nursing that she received a call from nuclear study that they cannot do an gastric emptying study until Wednesday morning because  patient had received Dilaudid/opioid at 1 PM yesterday and they cannot do the study until at least 48 hours later which will be Tuesday afternoon and they do not do studies in the afternoons.  I will place her back on clear liquid diet and empirically start on Reglan to see if she feels better and able to tolerate diet and will advance diet as tolerated.  Discontinue Dilaudid in case she ends up staying here through Wednesday.  UTI: Continue Rocephin.  Culture in process.  Uncontrolled type 2 diabetes mellitus with hyperglycemia, with long-term current use of insulin: Intermittent hypoglycemia, likely due to inability to tolerate p.o.  Last dose of Mounjaro was 2 weeks ago.  Waiting for blood sugar this morning, based on that, will reduce dextrose infusion frequency.  Prolonged QT interval: Improved.  Avoid QTc prolonging medications.  Hypomagnesemia: Being replaced.  CAD: Chest pain-free.  Continue home regimen.  Essential hypertension: Blood pressure on the low side but she is asymptomatic.  Continue metoprolol.  Elevated LFTs: Repeating labs today.  MRCP negative for any acute pathology.  CBD dilated but that can be seen in postcholecystectomy patients.  Hyperlipidemia: Crestor and Tricor on hold due to elevated LFTs.  DVT prophylaxis: Will start on Lovenox.   Code Status: Full Code  Family Communication:  None present at bedside.  Plan of care discussed with patient in length and he/she verbalized understanding and agreed with it.  Status is: Inpatient Remains inpatient appropriate because: Gastric emptying study today.  Still symptomatic.   Estimated body mass index is 23.53 kg/m as calculated from the following:   Height as of this encounter: 5\' 10"  (1.778 m).   Weight as of this encounter: 74.4 kg.  Nutritional Assessment: Body mass index is 23.53 kg/m.Marland Kitchen Seen by dietician.  I agree with the assessment and plan as outlined below: Nutrition Status:        . Skin  Assessment: I have examined the patient's skin and I agree with the wound assessment as performed by the wound care RN as outlined below:    Consultants:  None-Case discussed with GI by previous hospitalist.  Procedures:  None  Antimicrobials:  Anti-infectives (From admission, onward)    Start     Dose/Rate Route Frequency Ordered Stop   06/06/23 0015  metroNIDAZOLE (FLAGYL) tablet 500 mg        500 mg Oral Every 12 hours 06/05/23 2323     06/05/23 1100  cefTRIAXone (ROCEPHIN) 1 g in sodium chloride 0.9 % 100 mL IVPB        1 g 200 mL/hr over 30 Minutes Intravenous Daily 06/05/23 0948           Subjective: Patient seen and examined.  She is very pleasant.  She complains of nausea and now has diarrhea as well.  Some abdominal cramping at the right lower quadrant at times.  No other complaint.  No vomiting.  Objective: Vitals:   06/06/23 0515 06/06/23 1337 06/06/23 2149 06/07/23 0531  BP: (!) 99/52 105/65 111/62 (!) 97/54  Pulse: 77 82 79 76  Resp:  12 16 16   Temp: 98.2 F (36.8 C) 98.5 F (36.9 C) 98 F (36.7 C) 97.7 F (36.5 C)  TempSrc: Oral Oral Oral Oral  SpO2: 98% 97% 98% 99%  Weight:      Height:        Intake/Output Summary (Last 24 hours) at 06/07/2023 0853 Last data filed at 06/07/2023 0128 Gross per 24 hour  Intake 1058.66 ml  Output 200 ml  Net 858.66 ml   Filed Weights   06/03/23 1403 06/03/23 2239  Weight: 90.1 kg 74.4 kg    Examination:  General exam: Appears calm and comfortable  Respiratory system: Clear to auscultation. Respiratory effort normal. Cardiovascular system: S1 & S2 heard, RRR. No JVD, murmurs, rubs, gallops or clicks. No pedal edema. Gastrointestinal system: Abdomen is nondistended, soft and nontender. No organomegaly or masses felt. Normal bowel sounds heard. Central nervous system: Alert and oriented. No focal neurological deficits. Extremities: Symmetric 5 x 5 power. Skin: No rashes, lesions or ulcers Psychiatry:  Judgement and insight appear normal. Mood & affect appropriate.    Data Reviewed: I have personally reviewed following labs and imaging studies  CBC: Recent Labs  Lab 06/03/23 1405 06/04/23 0118 06/05/23 1022 06/06/23 0604  WBC 8.7 5.6 9.8 6.9  NEUTROABS  --   --  7.7 5.3  HGB 14.1 11.2* 12.3 10.4*  HCT 39.9 33.1* 35.8* 30.3*  MCV 87.3 91.4 93.2 92.4  PLT 275 197 209 153   Basic Metabolic Panel: Recent Labs  Lab 06/03/23 1405 06/04/23 0118 06/04/23 1708 06/05/23 1022 06/06/23 0604  NA 138 136  --  140 137  K 4.0 3.6  --  3.6 3.6  CL 102 106  --  107 107  CO2 20* 22  --  22 22  GLUCOSE 227* 91  --  95 74  BUN 33* 26*  --  14 14  CREATININE 1.27* 0.98  --  0.81 0.85  CALCIUM 10.3 8.7*  --  9.5 8.9  MG  --   --  1.5* 2.1 1.7   GFR: Estimated Creatinine Clearance: 79 mL/min (by C-G formula based on SCr  of 0.85 mg/dL). Liver Function Tests: Recent Labs  Lab 06/04/23 0118 06/04/23 1112 06/05/23 1022 06/06/23 0604  AST 18 18 27  207*  ALT 18 16 21  171*  ALKPHOS 48 43 56 164*  BILITOT 1.1 0.8 0.9 1.1  PROT 6.4* 6.1* 6.8 6.0*  ALBUMIN 4.2 3.7 4.1 3.4*   Recent Labs  Lab 06/04/23 1112 06/06/23 1833  LIPASE 44 35   No results for input(s): "AMMONIA" in the last 168 hours. Coagulation Profile: No results for input(s): "INR", "PROTIME" in the last 168 hours. Cardiac Enzymes: Recent Labs  Lab 06/06/23 1833  CKTOTAL 26*   BNP (last 3 results) No results for input(s): "PROBNP" in the last 8760 hours. HbA1C: No results for input(s): "HGBA1C" in the last 72 hours. CBG: Recent Labs  Lab 06/06/23 0744 06/06/23 0944 06/06/23 1210 06/06/23 1610 06/06/23 2212  GLUCAP 63* 115* 88 105* 86   Lipid Profile: No results for input(s): "CHOL", "HDL", "LDLCALC", "TRIG", "CHOLHDL", "LDLDIRECT" in the last 72 hours. Thyroid Function Tests: No results for input(s): "TSH", "T4TOTAL", "FREET4", "T3FREE", "THYROIDAB" in the last 72 hours. Anemia Panel: Recent Labs     06/06/23 0603  VITAMINB12 406  FOLATE 8.8  FERRITIN 442*  TIBC 237*  IRON 26*   Sepsis Labs: No results for input(s): "PROCALCITON", "LATICACIDVEN" in the last 168 hours.  No results found for this or any previous visit (from the past 240 hour(s)).   Radiology Studies: MR ABDOMEN MRCP WO CONTRAST  Result Date: 06/07/2023 CLINICAL DATA:  Abnormal liver function tests.  Nausea and vomiting. EXAM: MRI ABDOMEN WITHOUT CONTRAST  (INCLUDING MRCP) TECHNIQUE: Multiplanar multisequence MR imaging of the abdomen was performed. Heavily T2-weighted images of the biliary and pancreatic ducts were obtained, and three-dimensional MRCP images were rendered by post processing. COMPARISON:  CT AP 06/05/2023 FINDINGS: Lower chest: No acute findings. Hepatobiliary: Mildly T2 hyperintense structure along the dome of the liver measures 8 mm, image 8/8. No additional focal liver abnormality. Cholecystectomy. Fusiform dilatation of the common bile duct measures 1.2 cm proximally tapering to 2 mm just before the and below. No signs of choledocholithiasis. No mass identified. Pancreas: No pancreatic inflammation, mass or main duct dilatation identified. Spleen:  Measures 14.1 cm in length.  No focal liver abnormality. Adrenals/Urinary Tract: Normal adrenal glands. No hydronephrosis identified bilaterally. Uniformly T1 hyperintense, slightly exophytic lesion off the upper pole of right kidney measures 1.1 cm, image 38/17. No hydronephrosis identified bilaterally. Stomach/Bowel: Visualized portions within the abdomen are unremarkable. Vascular/Lymphatic: Aortic atherosclerosis. No aneurysm. No abdominal adenopathy. Other:  No free fluid or fluid collections. Musculoskeletal: No suspicious bone lesions identified. IMPRESSION: 1. Fusiform dilatation of the common bile duct measures 1.2 cm proximally tapering to 2 mm just before the and below. No signs of choledocholithiasis or mass identified. This is favored to represent post  cholecystectomy reservoir effect. 2. Mild splenomegaly. 3. Uniformly T1 hyperintense, slightly exophytic lesion off the upper pole of the right kidney measures 1.1 cm. This is favored to represent a hemorrhagic or proteinaceous cyst. Electronically Signed   By: Signa Kell M.D.   On: 06/07/2023 05:39   MR 3D Recon At Scanner  Result Date: 06/07/2023 CLINICAL DATA:  Abnormal liver function tests.  Nausea and vomiting. EXAM: MRI ABDOMEN WITHOUT CONTRAST  (INCLUDING MRCP) TECHNIQUE: Multiplanar multisequence MR imaging of the abdomen was performed. Heavily T2-weighted images of the biliary and pancreatic ducts were obtained, and three-dimensional MRCP images were rendered by post processing. COMPARISON:  CT AP 06/05/2023 FINDINGS: Lower  chest: No acute findings. Hepatobiliary: Mildly T2 hyperintense structure along the dome of the liver measures 8 mm, image 8/8. No additional focal liver abnormality. Cholecystectomy. Fusiform dilatation of the common bile duct measures 1.2 cm proximally tapering to 2 mm just before the and below. No signs of choledocholithiasis. No mass identified. Pancreas: No pancreatic inflammation, mass or main duct dilatation identified. Spleen:  Measures 14.1 cm in length.  No focal liver abnormality. Adrenals/Urinary Tract: Normal adrenal glands. No hydronephrosis identified bilaterally. Uniformly T1 hyperintense, slightly exophytic lesion off the upper pole of right kidney measures 1.1 cm, image 38/17. No hydronephrosis identified bilaterally. Stomach/Bowel: Visualized portions within the abdomen are unremarkable. Vascular/Lymphatic: Aortic atherosclerosis. No aneurysm. No abdominal adenopathy. Other:  No free fluid or fluid collections. Musculoskeletal: No suspicious bone lesions identified. IMPRESSION: 1. Fusiform dilatation of the common bile duct measures 1.2 cm proximally tapering to 2 mm just before the and below. No signs of choledocholithiasis or mass identified. This is favored  to represent post cholecystectomy reservoir effect. 2. Mild splenomegaly. 3. Uniformly T1 hyperintense, slightly exophytic lesion off the upper pole of the right kidney measures 1.1 cm. This is favored to represent a hemorrhagic or proteinaceous cyst. Electronically Signed   By: Signa Kell M.D.   On: 06/07/2023 05:39   CT ABDOMEN PELVIS W CONTRAST  Result Date: 06/05/2023 CLINICAL DATA:  Nausea and vomiting EXAM: CT ABDOMEN AND PELVIS WITH CONTRAST TECHNIQUE: Multidetector CT imaging of the abdomen and pelvis was performed using the standard protocol following bolus administration of intravenous contrast. RADIATION DOSE REDUCTION: This exam was performed according to the departmental dose-optimization program which includes automated exposure control, adjustment of the mA and/or kV according to patient size and/or use of iterative reconstruction technique. CONTRAST:  OMNIPAQUE IOHEXOL 300 MG/ML  SOLN COMPARISON:  12/26/2021 FINDINGS: Lower chest: Calcified granuloma within the left lower lobe. Lung bases are otherwise clear. Coronary artery atherosclerosis. Hepatobiliary: No focal liver abnormality is seen. Status post cholecystectomy. No biliary dilatation. Pancreas: Unremarkable. No pancreatic ductal dilatation or surrounding inflammatory changes. Spleen: Normal in size without focal abnormality. Adrenals/Urinary Tract: Unremarkable adrenal glands. 1.1 cm indeterminate density lesion arises from the upper pole of the right kidney, slightly increased in size compared to the prior study. Kidneys are otherwise unremarkable. Assessment for renal stones is limited given excreted contrast in the collecting systems. No hydronephrosis. Urinary bladder within normal limits. Stomach/Bowel: Stomach within normal limits. No dilated loops of bowel. Prior appendectomy. Mild circumferential bowel wall thickening of the ascending colon with mild pericolonic fat stranding. No additional sites of focal bowel wall  thickening. Vascular/Lymphatic: Scattered aortoiliac atherosclerotic calcifications without aneurysm. No abdominopelvic lymphadenopathy. Reproductive: Uterus and bilateral adnexa are unremarkable. Other: No free fluid. No abdominopelvic fluid collection. No pneumoperitoneum. No abdominal wall hernia. Soft tissue thickening in the subcutaneous fat of the lower anterior abdominal wall is likely injection related. Musculoskeletal: No acute or significant osseous findings. IMPRESSION: 1. Mild circumferential bowel wall thickening of the ascending colon with mild pericolonic fat stranding, suggestive of a nonspecific colitis. 2. Indeterminate density 1.1 cm lesion arises from the upper pole of the right kidney, slightly increased in size compared to the prior study. This likely represents a small cyst complicated by internal hemorrhagic or proteinaceous material. Follow-up nonemergent ultrasound of the kidney is recommended to further assess. 3. Aortic atherosclerosis (ICD10-I70.0). Electronically Signed   By: Duanne Guess D.O.   On: 06/05/2023 17:20    Scheduled Meds:  icosapent Ethyl  2 g Oral  BID   insulin aspart  0-15 Units Subcutaneous TID WC   insulin aspart  0-5 Units Subcutaneous QHS   metoprolol tartrate  100 mg Oral BID   metroNIDAZOLE  500 mg Oral Q12H   Continuous Infusions:  cefTRIAXone (ROCEPHIN)  IV 1 g (06/06/23 0850)   dextrose 5 % and 0.9 % NaCl with KCl 20 mEq/L 100 mL/hr at 06/07/23 0128     LOS: 3 days   Hughie Closs, MD [facility]  06/07/2023, 8:53 AM   *Please note that this is a verbal dictation therefore any spelling or grammatical errors are due to the "[employer]agon [employer] One" system interpretation.  Please page via Amion and do not message via secure chat for urgent patient care matters. Secure chat can be used for non urgent patient care matters.  How to contact the [facility] Attending or Consulting provider 7A - 7P or covering provider during after hours 7P -7A, for  this patient?  Check the care team in [facility] and look for a) attending/consulting [facility] provider listed and b) the [facility] team listed. Page or secure chat 7A-7P. Log into [email or web address] and use [facility] universal password to access. If you do not have the password, please contact the hospital operator. Locate the [facility] provider you are looking for under [facility] and page to a number that you can be directly reached. If you still have difficulty reaching the provider, please page the [facility] (Director on Call) for the Hospitalists listed on amion for assistance.

## 2023-06-07 NOTE — Assessment & Plan Note (Signed)
Patient identified to have abnormal transaminases after my discussion with gastroenterology earlier in the day. Holding Crestor, holding fenofibrate Will obtain lipase, hepatitis panel, creatinine kinase Will additionally obtain MRCP, Patient reports she no longer has a gallbladder

## 2023-06-08 DIAGNOSIS — R112 Nausea with vomiting, unspecified: Secondary | ICD-10-CM | POA: Diagnosis not present

## 2023-06-08 LAB — MAGNESIUM: Magnesium: 1.4 mg/dL — ABNORMAL LOW (ref 1.7–2.4)

## 2023-06-08 LAB — COMPREHENSIVE METABOLIC PANEL
ALT: 122 U/L — ABNORMAL HIGH (ref 0–44)
AST: 64 U/L — ABNORMAL HIGH (ref 15–41)
Albumin: 3.4 g/dL — ABNORMAL LOW (ref 3.5–5.0)
Alkaline Phosphatase: 212 U/L — ABNORMAL HIGH (ref 38–126)
Anion gap: 9 (ref 5–15)
BUN: 9 mg/dL (ref 6–20)
CO2: 23 mmol/L (ref 22–32)
Calcium: 8.9 mg/dL (ref 8.9–10.3)
Chloride: 108 mmol/L (ref 98–111)
Creatinine, Ser: 0.88 mg/dL (ref 0.44–1.00)
GFR, Estimated: >60 mL/min (ref >60)
Glucose, Bld: 80 mg/dL (ref 70–99)
Potassium: 3.7 mmol/L (ref 3.5–5.1)
Sodium: 140 mmol/L (ref 135–145)
Total Bilirubin: 0.8 mg/dL (ref 0.3–1.2)
Total Protein: 6 g/dL — ABNORMAL LOW (ref 6.5–8.1)

## 2023-06-08 LAB — GLUCOSE, CAPILLARY
Glucose-Capillary: 80 mg/dL (ref 70–99)
Glucose-Capillary: 92 mg/dL (ref 70–99)
Glucose-Capillary: 107 mg/dL — ABNORMAL HIGH (ref 70–99)
Glucose-Capillary: 98 mg/dL (ref 70–99)

## 2023-06-08 MED ORDER — MAGNESIUM SULFATE 4 GM/100ML IV SOLN
4.0000 g | Freq: Once | INTRAVENOUS | Status: AC
  Administered 2023-06-08: 4 g via INTRAVENOUS
  Filled 2023-06-08: qty 100

## 2023-06-08 MED ORDER — MELATONIN 5 MG PO TABS
10.0000 mg | ORAL_TABLET | Freq: Once | ORAL | Status: AC
  Administered 2023-06-08: 10 mg via ORAL
  Filled 2023-06-08: qty 2

## 2023-06-08 NOTE — Plan of Care (Signed)
  Problem: Education: Goal: Knowledge of General Education information will improve Description Including pain rating scale, medication(s)/side effects and non-pharmacologic comfort measures Outcome: Progressing   Problem: Health Behavior/Discharge Planning: Goal: Ability to manage health-related needs will improve Outcome: Progressing   

## 2023-06-08 NOTE — Plan of Care (Signed)
  Problem: Education: Goal: Knowledge of General Education information will improve Description: Including pain rating scale, medication(s)/side effects and non-pharmacologic comfort measures Outcome: Progressing   Problem: Health Behavior/Discharge Planning: Goal: Ability to manage health-related needs will improve Outcome: Progressing   Problem: Clinical Measurements: Goal: Ability to maintain clinical measurements within normal limits will improve Outcome: Progressing Goal: Will remain free from infection Outcome: Progressing Goal: Diagnostic test results will improve Outcome: Progressing Goal: Respiratory complications will improve Outcome: Progressing Goal: Cardiovascular complication will be avoided Outcome: Progressing   Problem: Activity: Goal: Risk for activity intolerance will decrease Outcome: Progressing   Problem: Nutrition: Goal: Adequate nutrition will be maintained Outcome: Progressing   Problem: Coping: Goal: Level of anxiety will decrease Outcome: Progressing   Problem: Elimination: Goal: Will not experience complications related to urinary retention Outcome: Progressing   Problem: Pain Managment: Goal: General experience of comfort will improve Outcome: Progressing   Problem: Safety: Goal: Ability to remain free from injury will improve Outcome: Progressing   Problem: Skin Integrity: Goal: Risk for impaired skin integrity will decrease Outcome: Progressing   Problem: Education: Goal: Ability to describe self-care measures that may prevent or decrease complications (Diabetes Survival Skills Education) will improve Outcome: Progressing Goal: Individualized Educational Video(s) Outcome: Progressing   Problem: Coping: Goal: Ability to adjust to condition or change in health will improve Outcome: Progressing   Problem: Fluid Volume: Goal: Ability to maintain a balanced intake and output will improve Outcome: Progressing   Problem: Health  Behavior/Discharge Planning: Goal: Ability to identify and utilize available resources and services will improve Outcome: Progressing Goal: Ability to manage health-related needs will improve Outcome: Progressing   Problem: Metabolic: Goal: Ability to maintain appropriate glucose levels will improve Outcome: Progressing   Problem: Nutritional: Goal: Maintenance of adequate nutrition will improve Outcome: Progressing Goal: Progress toward achieving an optimal weight will improve Outcome: Progressing   Problem: Skin Integrity: Goal: Risk for impaired skin integrity will decrease Outcome: Progressing   Problem: Tissue Perfusion: Goal: Adequacy of tissue perfusion will improve Outcome: Progressing

## 2023-06-08 NOTE — Progress Notes (Signed)
PROGRESS NOTE    Alexis Campos  GMW:[id number] DOB: 1965/11/07 DOA: 06/03/2023 PCP: [facility]   Brief Narrative:  57 year old female with past medical history of insulin-dependent diabetes mellitus type 2, hyperlipidemia, hypertension, nonobstructive coronary artery disease who presented to [facility] emergency department with several days of nausea vomiting and inability to tolerate oral intake.   The hospital group was called and patient was accepted for transfer to [facility] for continued medical management after patient continued to not be able to tolerate oral intake.   Patient has been managed with intravenous volume resuscitation and antiemetics with a concurrent workup for various etiologies.  Assessment & Plan:   Principal Problem:   Intractable nausea and vomiting Active Problems:   Abnormal LFTs   Prolonged QT interval   Uncontrolled type 2 diabetes mellitus with hypoglycemia, with long-term current use of insulin (HCC)   Hypomagnesemia   Coronary artery disease involving native coronary artery of native heart without angina pectoris   Essential hypertension   Mixed diabetic hyperlipidemia associated with type 2 diabetes mellitus (HCC)   Abdominal pain   Obesity (BMI 30-39.9)  Intractable nausea/vomiting/diarrhea/acute colitis: CT abdomen at the time of initial admission showed ascending colon colitis.  Patient has not had any vomiting but continues to remain nauseous even with clear liquid diet.  Then started having diarrhea on the morning of 06/07/2023, stool studies are pending.  She has had 9 episodes in the last 24 hours.  No abdominal pain or fever.  Still nauseous but no vomiting.  She still wants to try full liquid diet.  Unsure if she has gastroparesis.  Gastric emptying study is ordered and she is scheduled to have that in the morning.  I empirically started her on Reglan but she is refusing to take that since she  believes that she has side effect of restless leg syndrome secondary to that.  I have discontinued that.  Continue as needed Zofran and Compazine in the meantime.   UTI: Continue Rocephin.  Urine culture insignificant growth.  Uncontrolled type 2 diabetes mellitus with hyperglycemia, with long-term current use of insulin: Had hypoglycemia, required dextrose, has been off of dextrose since the noon of 06/07/2023.  Blood sugar controlled.  Continue SSI.  Prolonged QT interval: Improved.  Avoid QTc prolonging medications.  Hypomagnesemia: Low again, will replenish.  CAD: Chest pain-free.  Continue home regimen.  Essential hypertension: Blood pressure on the low side but she is asymptomatic.  Continue metoprolol.  Elevated LFTs: MRCP negative for any acute pathology.  CBD dilated but that can be seen in postcholecystectomy patients.  LFTs improving.  Repeat CMP tomorrow.  Hyperlipidemia: Crestor and Tricor on hold due to elevated LFTs.  DVT prophylaxis: enoxaparin (LOVENOX) injection 40 mg Start: 06/07/23 1015Will start on Lovenox.   Code Status: Full Code  Family Communication:  None present at bedside.  Plan of care discussed with patient in length and he/she verbalized understanding and agreed with it.  Status is: Inpatient Remains inpatient appropriate because: Still symptomatic, advancing diet slowly, gastric emptying study tomorrow.  Estimated body mass index is 23.53 kg/m as calculated from the following:   Height as of this encounter: 5\' 10"  (1.778 m).   Weight as of this encounter: 74.4 kg.    Nutritional Assessment: Body mass index is 23.53 kg/m.Marland Kitchen Seen by dietician.  I agree with the assessment and plan as outlined below: Nutrition Status:        . Skin Assessment: I have examined the  patient's skin and I agree with the wound assessment as performed by the wound care RN as outlined below:    Consultants:  None-Case discussed with GI by previous  hospitalist.  Procedures:  None  Antimicrobials:  Anti-infectives (From admission, onward)    Start     Dose/Rate Route Frequency Ordered Stop   06/06/23 0015  metroNIDAZOLE (FLAGYL) tablet 500 mg        500 mg Oral Every 12 hours 06/05/23 2323     06/05/23 1100  cefTRIAXone (ROCEPHIN) 1 g in sodium chloride 0.9 % 100 mL IVPB        1 g 200 mL/hr over 30 Minutes Intravenous Daily 06/05/23 0948           Subjective: Seen and examined.  Still complains of nausea but no vomiting.  Has been having diarrhea.  No other complaint.  Objective: Vitals:   06/07/23 1417 06/07/23 1418 06/07/23 2025 06/08/23 0458  BP:  (!) 111/59 129/73 (!) 110/57  Pulse: 76 76 77 73  Resp: 16  18 18   Temp: 98 F (36.7 C)  (!) 97.5 F (36.4 C) (!) 97.4 F (36.3 C)  TempSrc: Oral  Oral Oral  SpO2: 95%  100% 97%  Weight:      Height:        Intake/Output Summary (Last 24 hours) at 06/08/2023 1315 Last data filed at 06/08/2023 1100 Gross per 24 hour  Intake 477 ml  Output --  Net 477 ml   Filed Weights   06/03/23 1403 06/03/23 2239  Weight: 90.1 kg 74.4 kg    Examination:  General exam: Appears calm and comfortable  Respiratory system: Clear to auscultation. Respiratory effort normal. Cardiovascular system: S1 & S2 heard, RRR. No JVD, murmurs, rubs, gallops or clicks. No pedal edema. Gastrointestinal system: Abdomen is nondistended, soft and nontender. No organomegaly or masses felt. Normal bowel sounds heard. Central nervous system: Alert and oriented. No focal neurological deficits. Extremities: Symmetric 5 x 5 power. Skin: No rashes, lesions or ulcers.  Psychiatry: Judgement and insight appear normal. Mood & affect appropriate.    Data Reviewed: I have personally reviewed following labs and imaging studies  CBC: Recent Labs  Lab 06/03/23 1405 06/04/23 0118 06/05/23 1022 06/06/23 0604  WBC 8.7 5.6 9.8 6.9  NEUTROABS  --   --  7.7 5.3  HGB 14.1 11.2* 12.3 10.4*  HCT 39.9  33.1* 35.8* 30.3*  MCV 87.3 91.4 93.2 92.4  PLT 275 197 209 153   Basic Metabolic Panel: Recent Labs  Lab 06/04/23 0118 06/04/23 1708 06/05/23 1022 06/06/23 0604 06/07/23 0851 06/08/23 0704  NA 136  --  140 137 137 140  K 3.6  --  3.6 3.6 3.4* 3.7  CL 106  --  107 107 106 108  CO2 22  --  22 22 23 23   GLUCOSE 91  --  95 74 114* 80  BUN 26*  --  14 14 10 9   CREATININE 0.98  --  0.81 0.85 0.90 0.88  CALCIUM 8.7*  --  9.5 8.9 8.6* 8.9  MG  --  1.5* 2.1 1.7  --  1.4*   GFR: Estimated Creatinine Clearance: 76.3 mL/min (by C-G formula based on SCr of 0.88 mg/dL). Liver Function Tests: Recent Labs  Lab 06/04/23 1112 06/05/23 1022 06/06/23 0604 06/07/23 0851 06/08/23 0704  AST 18 27 207* 146* 64*  ALT 16 21 171* 189* 122*  ALKPHOS 43 56 164* 269* 212*  BILITOT 0.8 0.9 1.1  0.7 0.8  PROT 6.1* 6.8 6.0* 6.2* 6.0*  ALBUMIN 3.7 4.1 3.4* 3.6 3.4*   Recent Labs  Lab 06/04/23 1112 06/06/23 1833  LIPASE 44 35   No results for input(s): "AMMONIA" in the last 168 hours. Coagulation Profile: No results for input(s): "INR", "PROTIME" in the last 168 hours. Cardiac Enzymes: Recent Labs  Lab 06/06/23 1833  CKTOTAL 26*   BNP (last 3 results) No results for input(s): "PROBNP" in the last 8760 hours. HbA1C: No results for input(s): "HGBA1C" in the last 72 hours. CBG: Recent Labs  Lab 06/07/23 1155 06/07/23 1619 06/07/23 2147 06/08/23 0800 06/08/23 1155  GLUCAP 90 93 96 80 92   Lipid Profile: No results for input(s): "CHOL", "HDL", "LDLCALC", "TRIG", "CHOLHDL", "LDLDIRECT" in the last 72 hours. Thyroid Function Tests: No results for input(s): "TSH", "T4TOTAL", "FREET4", "T3FREE", "THYROIDAB" in the last 72 hours. Anemia Panel: Recent Labs    06/06/23 0603  VITAMINB12 406  FOLATE 8.8  FERRITIN 442*  TIBC 237*  IRON 26*   Sepsis Labs: No results for input(s): "PROCALCITON", "LATICACIDVEN" in the last 168 hours.  Recent Results (from the past 240 hour(s))   Urine Culture (for pregnant, neutropenic or urologic patients or patients with an indwelling urinary catheter)     Status: Abnormal   Collection Time: 06/05/23  3:15 PM   Specimen: Urine, Clean Catch  Result Value Ref Range Status   Specimen Description   Final    URINE, CLEAN CATCH Performed at [facility] [address] [place], [place] [address]    Special Requests   Final    NONE Performed at [facility], [address] [place], [place] [address]    Culture (A)  Final    <10,000 COLONIES/mL INSIGNIFICANT GROWTH Performed at [facility], [address], [place], [place] [address]    Report Status 06/07/2023 FINAL  Final     Radiology Studies: MR ABDOMEN MRCP WO CONTRAST  Result Date: 06/07/2023 CLINICAL DATA:  Abnormal liver function tests.  Nausea and vomiting. EXAM: MRI ABDOMEN WITHOUT CONTRAST  (INCLUDING MRCP) TECHNIQUE: Multiplanar multisequence MR imaging of the abdomen was performed. Heavily T2-weighted images of the biliary and pancreatic ducts were obtained, and three-dimensional MRCP images were rendered by post processing. COMPARISON:  CT AP 06/05/2023 FINDINGS: Lower chest: No acute findings. Hepatobiliary: Mildly T2 hyperintense structure along the dome of the liver measures 8 mm, image 8/8. No additional focal liver abnormality. Cholecystectomy. Fusiform dilatation of the common bile duct measures 1.2 cm proximally tapering to 2 mm just before the and below. No signs of choledocholithiasis. No mass identified. Pancreas: No pancreatic inflammation, mass or main duct dilatation identified. Spleen:  Measures 14.1 cm in length.  No focal liver abnormality. Adrenals/Urinary Tract: Normal adrenal glands. No hydronephrosis identified bilaterally. Uniformly T1 hyperintense, slightly exophytic lesion off the upper pole of right kidney measures 1.1 cm, image 38/17. No hydronephrosis identified bilaterally. Stomach/Bowel: Visualized  portions within the abdomen are unremarkable. Vascular/Lymphatic: Aortic atherosclerosis. No aneurysm. No abdominal adenopathy. Other:  No free fluid or fluid collections. Musculoskeletal: No suspicious bone lesions identified. IMPRESSION: 1. Fusiform dilatation of the common bile duct measures 1.2 cm proximally tapering to 2 mm just before the and below. No signs of choledocholithiasis or mass identified. This is favored to represent post cholecystectomy reservoir effect. 2. Mild splenomegaly. 3. Uniformly T1 hyperintense, slightly exophytic lesion off the upper pole of the right kidney measures 1.1 cm. This is favored to represent  a hemorrhagic or proteinaceous cyst. Electronically Signed   By: Signa Kell M.D.   On: 06/07/2023 05:39   MR 3D Recon At Scanner  Result Date: 06/07/2023 CLINICAL DATA:  Abnormal liver function tests.  Nausea and vomiting. EXAM: MRI ABDOMEN WITHOUT CONTRAST  (INCLUDING MRCP) TECHNIQUE: Multiplanar multisequence MR imaging of the abdomen was performed. Heavily T2-weighted images of the biliary and pancreatic ducts were obtained, and three-dimensional MRCP images were rendered by post processing. COMPARISON:  CT AP 06/05/2023 FINDINGS: Lower chest: No acute findings. Hepatobiliary: Mildly T2 hyperintense structure along the dome of the liver measures 8 mm, image 8/8. No additional focal liver abnormality. Cholecystectomy. Fusiform dilatation of the common bile duct measures 1.2 cm proximally tapering to 2 mm just before the and below. No signs of choledocholithiasis. No mass identified. Pancreas: No pancreatic inflammation, mass or main duct dilatation identified. Spleen:  Measures 14.1 cm in length.  No focal liver abnormality. Adrenals/Urinary Tract: Normal adrenal glands. No hydronephrosis identified bilaterally. Uniformly T1 hyperintense, slightly exophytic lesion off the upper pole of right kidney measures 1.1 cm, image 38/17. No hydronephrosis identified bilaterally.  Stomach/Bowel: Visualized portions within the abdomen are unremarkable. Vascular/Lymphatic: Aortic atherosclerosis. No aneurysm. No abdominal adenopathy. Other:  No free fluid or fluid collections. Musculoskeletal: No suspicious bone lesions identified. IMPRESSION: 1. Fusiform dilatation of the common bile duct measures 1.2 cm proximally tapering to 2 mm just before the and below. No signs of choledocholithiasis or mass identified. This is favored to represent post cholecystectomy reservoir effect. 2. Mild splenomegaly. 3. Uniformly T1 hyperintense, slightly exophytic lesion off the upper pole of the right kidney measures 1.1 cm. This is favored to represent a hemorrhagic or proteinaceous cyst. Electronically Signed   By: Signa Kell M.D.   On: 06/07/2023 05:39    Scheduled Meds:  enoxaparin (LOVENOX) injection  40 mg Subcutaneous Q24H   icosapent Ethyl  2 g Oral BID   insulin aspart  0-15 Units Subcutaneous TID WC   insulin aspart  0-5 Units Subcutaneous QHS   melatonin  5 mg Oral QHS   metoprolol tartrate  100 mg Oral BID   metroNIDAZOLE  500 mg Oral Q12H   Continuous Infusions:  cefTRIAXone (ROCEPHIN)  IV 1 g (06/08/23 1610)   magnesium sulfate bolus IVPB 4 g (06/08/23 1221)     LOS: 4 days   Hughie Closs, MD [facility]  06/08/2023, 1:15 PM   *Please note that this is a verbal dictation therefore any spelling or grammatical errors are due to the "Dragon [employer] One" system interpretation.  Please page via Amion and do not message via secure chat for urgent patient care matters. Secure chat can be used for non urgent patient care matters.  How to contact the [facility] Attending or Consulting provider 7A - 7P or covering provider during after hours 7P -7A, for this patient?  Check the care team in [facility] and look for a) attending/consulting TRH provider listed and b) the [facility] team listed. Page or secure chat 7A-7P. Log into [email or web address] and use [place]'s universal password to  access. If you do not have the password, please contact the hospital operator. Locate the [facility] provider you are looking for under [facility] and page to a number that you can be directly reached. If you still have difficulty reaching the provider, please page the [facility] (Director on Call) for the Hospitalists listed on amion for assistance.

## 2023-06-09 ENCOUNTER — Inpatient Hospital Stay (HOSPITAL_COMMUNITY): Payer: Commercial Managed Care - PPO

## 2023-06-09 DIAGNOSIS — R112 Nausea with vomiting, unspecified: Secondary | ICD-10-CM | POA: Diagnosis not present

## 2023-06-09 LAB — COMPREHENSIVE METABOLIC PANEL
ALT: 86 U/L — ABNORMAL HIGH (ref 0–44)
AST: 35 U/L (ref 15–41)
Albumin: 3.5 g/dL (ref 3.5–5.0)
Alkaline Phosphatase: 185 U/L — ABNORMAL HIGH (ref 38–126)
Anion gap: 11 (ref 5–15)
BUN: 9 mg/dL (ref 6–20)
CO2: 24 mmol/L (ref 22–32)
Calcium: 9 mg/dL (ref 8.9–10.3)
Chloride: 104 mmol/L (ref 98–111)
Creatinine, Ser: 0.87 mg/dL (ref 0.44–1.00)
GFR, Estimated: >60 mL/min (ref >60)
Glucose, Bld: 82 mg/dL (ref 70–99)
Potassium: 3.2 mmol/L — ABNORMAL LOW (ref 3.5–5.1)
Sodium: 139 mmol/L (ref 135–145)
Total Bilirubin: 0.7 mg/dL (ref 0.3–1.2)
Total Protein: 5.9 g/dL — ABNORMAL LOW (ref 6.5–8.1)

## 2023-06-09 LAB — GLUCOSE, CAPILLARY
Glucose-Capillary: 147 mg/dL — ABNORMAL HIGH (ref 70–99)
Glucose-Capillary: 69 mg/dL — ABNORMAL LOW (ref 70–99)
Glucose-Capillary: 147 mg/dL — ABNORMAL HIGH (ref 70–99)
Glucose-Capillary: 188 mg/dL — ABNORMAL HIGH (ref 70–99)

## 2023-06-09 LAB — O&P RESULT

## 2023-06-09 LAB — OVA + PARASITE EXAM

## 2023-06-09 MED ORDER — DEXTROSE 50 % IV SOLN
INTRAVENOUS | Status: AC
  Administered 2023-06-09: 25 mL via INTRAVENOUS
  Filled 2023-06-09: qty 50

## 2023-06-09 MED ORDER — POTASSIUM CHLORIDE 10 MEQ/100ML IV SOLN
10.0000 meq | INTRAVENOUS | Status: AC
  Administered 2023-06-09 (×4): 10 meq via INTRAVENOUS
  Filled 2023-06-09 (×2): qty 100

## 2023-06-09 MED ORDER — TECHNETIUM TC 99M SULFUR COLLOID
1.9300 | Freq: Once | INTRAVENOUS | Status: AC
  Administered 2023-06-09: 1.93 via ORAL

## 2023-06-09 MED ORDER — ZOLPIDEM TARTRATE 5 MG PO TABS
5.0000 mg | ORAL_TABLET | Freq: Every evening | ORAL | Status: DC | PRN
  Administered 2023-06-09: 5 mg via ORAL
  Filled 2023-06-09: qty 1

## 2023-06-09 NOTE — TOC Initial Note (Addendum)
Transition of Care [facility]) - Initial/Assessment Note    Patient Details  Name: Alexis Campos MRN: [id number] Date of Birth: [birth year]  [facility]) CM/SW Contact:    Howell Rucks, RN Phone Number: 06/09/2023, 11:13 AM  Clinical Narrative:  Patient in GI procedure. Will complete initial assessment completed.      -2:50pm Briefly met with pt at bedside to introduce role of TOC/NCM, pt resting from GI procedure, PCP on file in H&P. TOC will follow for dc needs.                     Patient Goals and CMS Choice            Expected Discharge Plan and Services                                              Prior Living Arrangements/Services                       Activities of Daily Living   ADL Screening (condition at time of admission) Independently performs ADLs?: Yes (appropriate for developmental age) Is the patient deaf or have difficulty hearing?: No Does the patient have difficulty seeing, even when wearing glasses/contacts?: No Does the patient have difficulty concentrating, remembering, or making decisions?: No  Permission Sought/Granted                  Emotional Assessment              Admission diagnosis:  Intractable nausea and vomiting [R11.2] Patient Active Problem List   Diagnosis Date Noted   Abnormal LFTs 06/07/2023   Uncontrolled type 2 diabetes mellitus with hypoglycemia, with long-term current use of insulin (HCC) 06/06/2023   Mixed diabetic hyperlipidemia associated with type 2 diabetes mellitus (HCC) 06/06/2023   Coronary artery disease involving native coronary artery of native heart without angina pectoris 06/06/2023   Essential hypertension 06/06/2023   Prolonged QT interval 06/06/2023   Hypomagnesemia 06/06/2023   Intractable nausea and vomiting 06/03/2023   Diabetes mellitus type 2 with ketoacidosis (HCC) 12/27/2021   Pyelonephritis    Splenomegaly 05/22/2014   Anemia 05/22/2014    Obesity (BMI 30-39.9) 02/26/2014   Abdominal pain 10/23/2011   PCP:  [facility]:   [facility] [address] [place] [address] Phone: [phone] Fax: [phone]  [id number] [place], [place] - [address] [place] [address] Phone: [phone] Fax: [phone]  [facility] [address] [place] [address] Phone: [phone] Fax: [phone]     Social Determinants of Health (SDOH) Social History: SDOH Screenings   Food Insecurity: No Food Insecurity (06/03/2023)  Housing: Low Risk  (06/03/2023)  Transportation Needs: No Transportation Needs (06/03/2023)  Utilities: Not At Risk (06/03/2023)  Tobacco Use: Low Risk  (06/03/2023)   SDOH Interventions:     Readmission Risk Interventions     No data to display

## 2023-06-09 NOTE — Inpatient Diabetes Management (Signed)
Inpatient Diabetes Program Recommendations  AACE/ADA: New Consensus Statement on Inpatient Glycemic Control (2015)  Target Ranges:  Prepandial:   less than 140 mg/dL      Peak postprandial:   less than 180 mg/dL (1-2 hours)      Critically ill patients:  140 - 180 mg/dL   Lab Results  Component Value Date   GLUCAP 147 (H) 06/09/2023   HGBA1C 5.8 (H) 06/04/2023    Review of Glycemic Control  Latest Reference Range & Units 06/08/23 08:00 06/08/23 11:55 06/08/23 16:16 06/08/23 21:12 06/09/23 07:43 06/09/23 08:14  Glucose-Capillary 70 - 99 mg/dL 80 92 295 (H) 98 69 (L) 147 (H)   Diabetes history: DM 2 Outpatient Diabetes medications: Mounjaro 10 mg weekly, Semglee 68 units qhs, Novolog 0-15 units tid + hs Current orders for Inpatient glycemic control:  Novolog 0-15 units tid + hs  A1c 5.8% on 06/04/2023  Inpatient Diabetes Program Recommendations:    -   D/C Mounjaro home medication and have pt follow up with Endocrinologist, Dr. Talmage Nap.   Spoke with pt at bedside. Pt on Mounjaro at home GLP-1/GIP reduces appetite and slows gastric emptying. Pt reports being on the medication for approx 1 year. Last dose change was approx 10 months ago. Pt reports complications with constipation for awhile and it is normal for her to not have a BM but every 4-5 days. She said the nausea however, is new. Pt reports weight loss from 236 last year to 157 currently. Pt had to be placed on mirilax. Pt also reports a diet of mostly protein and veggies very low carbohydrate intake. I told pt to discuss medication to Hospitalist and follow up with her Endocrinologist for further recommendations after hospitalization. Did discuss the common rebound effect after discontinuation of Mounjaro.  Thanks,  Christena Deem RN, MSN, BC-ADM Inpatient Diabetes Coordinator Team Pager [phone] (8a-5p)

## 2023-06-09 NOTE — Plan of Care (Signed)
  Problem: Education: Goal: Knowledge of General Education information will improve Description: Including pain rating scale, medication(s)/side effects and non-pharmacologic comfort measures Outcome: Progressing   Problem: Health Behavior/Discharge Planning: Goal: Ability to manage health-related needs will improve Outcome: Progressing   Problem: Clinical Measurements: Goal: Will remain free from infection Outcome: Progressing Goal: Respiratory complications will improve Outcome: Progressing Goal: Cardiovascular complication will be avoided Outcome: Progressing   Problem: Activity: Goal: Risk for activity intolerance will decrease Outcome: Progressing   Problem: Coping: Goal: Level of anxiety will decrease Outcome: Progressing   Problem: Elimination: Goal: Will not experience complications related to bowel motility Outcome: Progressing Goal: Will not experience complications related to urinary retention Outcome: Progressing   Problem: Pain Managment: Goal: General experience of comfort will improve Outcome: Progressing   Problem: Safety: Goal: Ability to remain free from injury will improve Outcome: Progressing

## 2023-06-09 NOTE — Progress Notes (Signed)
PROGRESS NOTE    Alexis Campos  OZH:[id number] DOB: March 31, 1966 DOA: 06/03/2023 PCP: [facility]   Brief Narrative:  57 year old female with past medical history of insulin-dependent diabetes mellitus type 2, hyperlipidemia, hypertension, nonobstructive coronary artery disease who presented to [facility] emergency department with several days of nausea vomiting and inability to tolerate oral intake.   The hospital group was called and patient was accepted for transfer to [facility] for continued medical management after patient continued to not be able to tolerate oral intake.   Patient has been managed with intravenous volume resuscitation and antiemetics with a concurrent workup for various etiologies.  Assessment & Plan:   Principal Problem:   Intractable nausea and vomiting Active Problems:   Abnormal LFTs   Prolonged QT interval   Uncontrolled type 2 diabetes mellitus with hypoglycemia, with long-term current use of insulin (HCC)   Hypomagnesemia   Coronary artery disease involving native coronary artery of native heart without angina pectoris   Essential hypertension   Mixed diabetic hyperlipidemia associated with type 2 diabetes mellitus (HCC)   Abdominal pain   Obesity (BMI 30-39.9)  Intractable nausea/vomiting/diarrhea/acute colitis: CT abdomen at the time of initial admission showed ascending colon colitis.  Patient has not had any vomiting but continues to remain nauseous but fairly tolerating full liquid diet.  Then started having diarrhea on the morning of 06/07/2023, stool culture and ova and parasite pending.  Diarrhea improving.  No abdominal pain or fever.  She wants to try soft diet after gastric emptying study today which is planned this morning.  She still does not want to try Reglan due to adverse effect of restless leg syndrome.  Continue as needed Compazine and Zofran.  UTI: Continue Rocephin.  Urine culture insignificant  growth.  Uncontrolled type 2 diabetes mellitus with hyperglycemia, with long-term current use of insulin: Had hypoglycemia, required dextrose, has been off of dextrose since the noon of 06/07/2023.  Blood sugar controlled.  Continue SSI.  Prolonged QT interval: Improved.  Avoid QTc prolonging medications.  Hypomagnesemia: Replenished yesterday.  Will recheck in the morning.  Hypokalemia: Will replenish.  CAD: Chest pain-free.  Continue home regimen.  Essential hypertension: Blood pressure on the low side but she is asymptomatic.  Continue metoprolol.  Elevated LFTs: MRCP negative for any acute pathology.  CBD dilated but that can be seen in postcholecystectomy patients.  LFTs improving.  Repeat CMP tomorrow.  Hyperlipidemia: Crestor and Tricor on hold due to elevated LFTs.  DVT prophylaxis: enoxaparin (LOVENOX) injection 40 mg Start: 06/07/23 1015Will start on Lovenox.   Code Status: Full Code  Family Communication:  None present at bedside.  Plan of care discussed with patient in length and he/she verbalized understanding and agreed with it.  Status is: Inpatient Remains inpatient appropriate because: Still symptomatic, advancing diet slowly.  If tolerated, potential discharge tomorrow.  Estimated body mass index is 23.53 kg/m as calculated from the following:   Height as of this encounter: 5\' 10"  (1.778 m).   Weight as of this encounter: 74.4 kg.    Nutritional Assessment: Body mass index is 23.53 kg/m.Marland Kitchen Seen by dietician.  I agree with the assessment and plan as outlined below: Nutrition Status:        . Skin Assessment: I have examined the patient's skin and I agree with the wound assessment as performed by the wound care RN as outlined below:    Consultants:  None-Case discussed with GI by previous hospitalist.  Procedures:  None  Antimicrobials:  Anti-infectives (From admission, onward)    Start     Dose/Rate Route Frequency Ordered Stop   06/06/23 0015   metroNIDAZOLE (FLAGYL) tablet 500 mg        500 mg Oral Every 12 hours 06/05/23 2323     06/05/23 1100  cefTRIAXone (ROCEPHIN) 1 g in sodium chloride 0.9 % 100 mL IVPB        1 g 200 mL/hr over 30 Minutes Intravenous Daily 06/05/23 0948           Subjective: Seen and examined.  Still complains of intermittent nausea but bearable for her.  No other complaint.  Objective: Vitals:   06/08/23 0458 06/08/23 1340 06/08/23 2108 06/09/23 0443  BP: (!) 110/57 104/64 124/68 109/79  Pulse: 73 80 74 71  Resp: 18 18 18 18   Temp: (!) 97.4 F (36.3 C) 97.9 F (36.6 C) 97.7 F (36.5 C) 97.7 F (36.5 C)  TempSrc: Oral Oral Oral Oral  SpO2: 97% 100% 100% 98%  Weight:      Height:        Intake/Output Summary (Last 24 hours) at 06/09/2023 1145 Last data filed at 06/09/2023 0500 Gross per 24 hour  Intake 880 ml  Output --  Net 880 ml   Filed Weights   06/03/23 1403 06/03/23 2239  Weight: 90.1 kg 74.4 kg    Examination:  General exam: Appears calm and comfortable  Respiratory system: Clear to auscultation. Respiratory effort normal. Cardiovascular system: S1 & S2 heard, RRR. No JVD, murmurs, rubs, gallops or clicks. No pedal edema. Gastrointestinal system: Abdomen is nondistended, soft and nontender. No organomegaly or masses felt. Normal bowel sounds heard. Central nervous system: Alert and oriented. No focal neurological deficits. Extremities: Symmetric 5 x 5 power. Skin: No rashes, lesions or ulcers.  Psychiatry: Judgement and insight appear normal. Mood & affect appropriate.   Data Reviewed: I have personally reviewed following labs and imaging studies  CBC: Recent Labs  Lab 06/03/23 1405 06/04/23 0118 06/05/23 1022 06/06/23 0604  WBC 8.7 5.6 9.8 6.9  NEUTROABS  --   --  7.7 5.3  HGB 14.1 11.2* 12.3 10.4*  HCT 39.9 33.1* 35.8* 30.3*  MCV 87.3 91.4 93.2 92.4  PLT 275 197 209 153   Basic Metabolic Panel: Recent Labs  Lab 06/04/23 1708 06/05/23 1022 06/06/23 0604  06/07/23 0851 06/08/23 0704 06/09/23 0701  NA  --  140 137 137 140 139  K  --  3.6 3.6 3.4* 3.7 3.2*  CL  --  107 107 106 108 104  CO2  --  22 22 23 23 24   GLUCOSE  --  95 74 114* 80 82  BUN  --  14 14 10 9 9   CREATININE  --  0.81 0.85 0.90 0.88 0.87  CALCIUM  --  9.5 8.9 8.6* 8.9 9.0  MG 1.5* 2.1 1.7  --  1.4*  --    GFR: Estimated Creatinine Clearance: 77.1 mL/min (by C-G formula based on SCr of 0.87 mg/dL). Liver Function Tests: Recent Labs  Lab 06/05/23 1022 06/06/23 0604 06/07/23 0851 06/08/23 0704 06/09/23 0701  AST 27 207* 146* 64* 35  ALT 21 171* 189* 122* 86*  ALKPHOS 56 164* 269* 212* 185*  BILITOT 0.9 1.1 0.7 0.8 0.7  PROT 6.8 6.0* 6.2* 6.0* 5.9*  ALBUMIN 4.1 3.4* 3.6 3.4* 3.5   Recent Labs  Lab 06/04/23 1112 06/06/23 1833  LIPASE 44 35   No results for input(s): "AMMONIA" in the  last 168 hours. Coagulation Profile: No results for input(s): "INR", "PROTIME" in the last 168 hours. Cardiac Enzymes: Recent Labs  Lab 06/06/23 1833  CKTOTAL 26*   BNP (last 3 results) No results for input(s): "PROBNP" in the last 8760 hours. HbA1C: No results for input(s): "HGBA1C" in the last 72 hours. CBG: Recent Labs  Lab 06/08/23 1155 06/08/23 1616 06/08/23 2112 06/09/23 0743 06/09/23 0814  GLUCAP 92 107* 98 69* 147*   Lipid Profile: No results for input(s): "CHOL", "HDL", "LDLCALC", "TRIG", "CHOLHDL", "LDLDIRECT" in the last 72 hours. Thyroid Function Tests: No results for input(s): "TSH", "T4TOTAL", "FREET4", "T3FREE", "THYROIDAB" in the last 72 hours. Anemia Panel: No results for input(s): "VITAMINB12", "FOLATE", "FERRITIN", "TIBC", "IRON", "RETICCTPCT" in the last 72 hours.  Sepsis Labs: No results for input(s): "PROCALCITON", "LATICACIDVEN" in the last 168 hours.  Recent Results (from the past 240 hour(s))  Urine Culture (for pregnant, neutropenic or urologic patients or patients with an indwelling urinary catheter)     Status: Abnormal    Collection Time: 06/05/23  3:15 PM   Specimen: Urine, Clean Catch  Result Value Ref Range Status   Specimen Description   Final    URINE, CLEAN CATCH Performed at [facility], [address], [place], [place] [address]    Special Requests   Final    NONE Performed at [facility], [address], [place], [place] [address]    Culture (A)  Final    <10,000 COLONIES/mL INSIGNIFICANT GROWTH Performed at [facility], [address], [place], [place] [address]    Report Status 06/07/2023 FINAL  Final  Stool culture     Status: None (Preliminary result)   Collection Time: 06/07/23  1:16 PM   Specimen: Stool  Result Value Ref Range Status   Salmonella/Shigella Screen PENDING  Incomplete   Campylobacter Culture PENDING  Incomplete   E coli, Shiga toxin Assay Negative Negative Final    Comment: (NOTE) Performed At: [facility] [address] [place], [place] [id number] Jolene Schimke MD LO:[id number]      Radiology Studies: No results found.  Scheduled Meds:  enoxaparin (LOVENOX) injection  40 mg Subcutaneous Q24H   icosapent Ethyl  2 g Oral BID   insulin aspart  0-15 Units Subcutaneous TID WC   insulin aspart  0-5 Units Subcutaneous QHS   metoprolol tartrate  100 mg Oral BID   metroNIDAZOLE  500 mg Oral Q12H   Continuous Infusions:  cefTRIAXone (ROCEPHIN)  IV 1 g (06/08/23 0937)   potassium chloride       LOS: 5 days   Hughie Closs, MD [facility]  06/09/2023, 11:45 AM   *Please note that this is a verbal dictation therefore any spelling or grammatical errors are due to the "[employer]agon [employer] One" system interpretation.  Please page via Amion and do not message via secure chat for urgent patient care matters. Secure chat can be used for non urgent patient care matters.  How to contact the [facility] Attending or Consulting provider 7A - 7P or covering provider during after hours 7P -7A, for this patient?  Check the  care team in [facility] and look for a) attending/consulting [facility] provider listed and b) the [facility] team listed. Page or secure chat 7A-7P. Log into [email or web address] and use [place]'s universal password to access. If you do not have the password, please contact the hospital operator. Locate the [facility] provider you are looking for under [facility] and page to a  number that you can be directly reached. If you still have difficulty reaching the provider, please page the [facility] (Director on Call) for the Hospitalists listed on amion for assistance.

## 2023-06-10 ENCOUNTER — Other Ambulatory Visit (HOSPITAL_BASED_OUTPATIENT_CLINIC_OR_DEPARTMENT_OTHER): Payer: Self-pay

## 2023-06-10 ENCOUNTER — Other Ambulatory Visit (HOSPITAL_COMMUNITY): Payer: Self-pay

## 2023-06-10 DIAGNOSIS — R112 Nausea with vomiting, unspecified: Secondary | ICD-10-CM | POA: Diagnosis not present

## 2023-06-10 LAB — COMPREHENSIVE METABOLIC PANEL
ALT: 60 U/L — ABNORMAL HIGH (ref 0–44)
AST: 20 U/L (ref 15–41)
Albumin: 3.4 g/dL — ABNORMAL LOW (ref 3.5–5.0)
Alkaline Phosphatase: 152 U/L — ABNORMAL HIGH (ref 38–126)
Anion gap: 8 (ref 5–15)
BUN: 12 mg/dL (ref 6–20)
CO2: 27 mmol/L (ref 22–32)
Calcium: 9 mg/dL (ref 8.9–10.3)
Chloride: 104 mmol/L (ref 98–111)
Creatinine, Ser: 0.94 mg/dL (ref 0.44–1.00)
GFR, Estimated: >60 mL/min (ref >60)
Glucose, Bld: 117 mg/dL — ABNORMAL HIGH (ref 70–99)
Potassium: 3.7 mmol/L (ref 3.5–5.1)
Sodium: 139 mmol/L (ref 135–145)
Total Bilirubin: 0.4 mg/dL (ref 0.3–1.2)
Total Protein: 5.8 g/dL — ABNORMAL LOW (ref 6.5–8.1)

## 2023-06-10 LAB — CBC
HCT: 30.6 % — ABNORMAL LOW (ref 36.0–46.0)
Hemoglobin: 10.3 g/dL — ABNORMAL LOW (ref 12.0–15.0)
MCH: 31.1 pg (ref 26.0–34.0)
MCHC: 33.7 g/dL (ref 30.0–36.0)
MCV: 92.4 fL (ref 80.0–100.0)
Platelets: 171 10*3/uL (ref 150–400)
RBC: 3.31 MIL/uL — ABNORMAL LOW (ref 3.87–5.11)
RDW: 12.9 % (ref 11.5–15.5)
WBC: 3.5 10*3/uL — ABNORMAL LOW (ref 4.0–10.5)
nRBC: 0 % (ref 0.0–0.2)

## 2023-06-10 LAB — MAGNESIUM: Magnesium: 1.6 mg/dL — ABNORMAL LOW (ref 1.7–2.4)

## 2023-06-10 LAB — GLUCOSE, CAPILLARY: Glucose-Capillary: 108 mg/dL — ABNORMAL HIGH (ref 70–99)

## 2023-06-10 MED ORDER — ROSUVASTATIN CALCIUM 40 MG PO TABS: 40.0000 mg | ORAL_TABLET | Freq: Every day | ORAL | Status: AC

## 2023-06-10 MED ORDER — FENOFIBRATE 48 MG PO TABS: 48.0000 mg | ORAL_TABLET | Freq: Every day | ORAL | Status: AC

## 2023-06-10 MED ORDER — CIPROFLOXACIN HCL 500 MG PO TABS
500.0000 mg | ORAL_TABLET | Freq: Two times a day (BID) | ORAL | 0 refills | Status: AC
  Filled 2023-06-10 (×2): qty 10, 5d supply, fill #0

## 2023-06-10 MED ORDER — ORAL CARE MOUTH RINSE: 15.0000 mL | OROMUCOSAL | Status: DC | PRN

## 2023-06-10 MED ORDER — ONDANSETRON 4 MG PO TBDP
4.0000 mg | ORAL_TABLET | Freq: Three times a day (TID) | ORAL | 0 refills | Status: DC | PRN
  Filled 2023-06-10 (×2): qty 30, 10d supply, fill #0

## 2023-06-10 MED ORDER — METRONIDAZOLE 500 MG PO TABS
500.0000 mg | ORAL_TABLET | Freq: Three times a day (TID) | ORAL | 0 refills | Status: AC
  Filled 2023-06-10 (×2): qty 15, 5d supply, fill #0

## 2023-06-10 MED ORDER — MAGNESIUM SULFATE 2 GM/50ML IV SOLN
2.0000 g | Freq: Once | INTRAVENOUS | Status: AC
  Administered 2023-06-10: 2 g via INTRAVENOUS
  Filled 2023-06-10: qty 50

## 2023-06-10 NOTE — Plan of Care (Signed)

## 2023-06-10 NOTE — Discharge Summary (Addendum)
Physician Discharge Summary  Alexis Campos WGN:[id number] DOB: [birth year] DOA: 06/03/2023  PCP: [facility]  Admit date: 06/03/2023 Discharge date: 06/10/2023 30 Day Unplanned Readmission Risk Score    Flowsheet Row ED to Hosp-Admission (Current) from 06/03/2023 in [facility]  30 Day Unplanned Readmission Risk Score (%) 12.86 Filed at 06/10/2023 0801       This score is the patient's risk of an unplanned readmission within 30 days of being discharged (0 -100%). The score is based on dignosis, age, lab data, medications, orders, and past utilization.   Low:  0-14.9   Medium: 15-21.9   High: 22-29.9   Extreme: 30 and above          Admitted From: Home Disposition: Home  Recommendations for Outpatient Follow-up:  Follow up with PCP in 1-2 weeks Please obtain BMP/CBC in one week Please follow up with your PCP on the following pending results: Unresulted Labs (From admission, onward)    None         Home Health: None Equipment/Devices: None  Discharge Condition: Stable CODE STATUS: Full code Diet recommendation: Soft diet and then advance to regular diet as tolerated in next couple of days  Subjective: Patient seen and examined.  Doing well.  She had 1 episode of vomiting yesterday but otherwise she is feeling well and she feels confident and comfortable going home today.  Brief/Interim Summary: 57 year old female with past medical history of insulin-dependent diabetes mellitus type 2, hyperlipidemia, hypertension, nonobstructive coronary artery disease who presented to [facility] emergency department with several days of nausea vomiting and inability to tolerate oral intake and admitted under hospital service.  Details below.   Intractable nausea/vomiting/diarrhea/acute colitis: CT abdomen at the time of initial admission showed ascending colon colitis.  Patient has not had any vomiting but continued to remain nauseous.  Then started having diarrhea on the morning of 06/07/2023, stool culture and ova and parasite negative.  Patient was on Rocephin and Flagyl, has received 5 days of antibiotics.  Patient's symptoms improved but very gradually.  Diet was also introduced to clear liquid diet and gradually advanced and currently she is tolerating soft diet.  Due to persistent symptoms, we suspected gastroparesis and obtain gastric emptying study which was surprisingly normal.  Patient symptoms could be combination of acute colitis and Mounjaro.  I will discharge her on 5 more days of ciprofloxacin and Flagyl to treat her colitis however I have strongly advised her to discuss with her endocrinologist about possibly reducing the dose of Mounjaro as a trial basis.  She said she will discuss with her endocrinologist.    UTI: Received Rocephin for 5 days.  Urine culture with no significant growth.   Uncontrolled type 2 diabetes mellitus with hyperglycemia, with long-term current use of insulin: Had hypoglycemia, required dextrose, has been off of dextrose since the noon of 06/07/2023.  Blood sugar controlled.    Prolonged QT interval: Improved.     Hypomagnesemia: Slightly low again today.  Will be replenished before discharge today.   Hypokalemia: Resolved.   CAD: Chest pain-free.  Continue home regimen.   Essential hypertension: Blood pressure on the low side but she is asymptomatic.  Continue metoprolol.   Elevated LFTs: MRCP negative for any acute pathology.  CBD dilated but that can be seen in postcholecystectomy patients.  LFTs improving.  Repeat CMP in 1 week with PCP.   Hyperlipidemia: Crestor and Tricor on hold due to elevated LFTs.  Recommend holding it for  another week and repeating CMP at PCPs office before resuming both of those medications.  Discharge plan was discussed with patient and/or family member and they verbalized understanding and agreed with it.  Discharge Diagnoses:  Principal Problem:    Intractable nausea and vomiting Active Problems:   Abnormal LFTs   Prolonged QT interval   Uncontrolled type 2 diabetes mellitus with hypoglycemia, with long-term current use of insulin (HCC)   Hypomagnesemia   Coronary artery disease involving native coronary artery of native heart without angina pectoris   Essential hypertension   Mixed diabetic hyperlipidemia associated with type 2 diabetes mellitus (HCC)   Abdominal pain   Obesity (BMI 30-39.9)    Discharge Instructions   Allergies as of 06/10/2023       Reactions   Codeine Itching   Duloxetine Hcl Other (See Comments)   Adverse effect - Tremor   Lisinopril Itching   Metoclopramide Hcl Nausea Only   Restless leg        Medication List     STOP taking these medications    cyclobenzaprine 5 MG tablet Commonly known as: FLEXERIL       TAKE these medications    ciprofloxacin 500 MG tablet Commonly known as: Cipro Take 1 tablet (500 mg total) by mouth 2 (two) times daily for 5 days. What changed: when to take this   Dexcom G6 Receiver [facility] Use as directed.   diphenhydramine-acetaminophen 25-500 MG Tabs tablet Commonly known as: TYLENOL PM Take 2 tablets by mouth at bedtime as needed (sleep).   fenofibrate 48 MG tablet Commonly known as: Tricor Take 1 tablet (48 mg total) by mouth daily. Start taking on: June 17, 2023 What changed: These instructions start on June 17, 2023. If you are unsure what to do until then, ask your doctor or other care provider.   HumaLOG KwikPen 200 UNIT/ML KwikPen Generic drug: insulin lispro Inject 5-15 Units into the skin 3 (three) times daily. What changed:  how much to take Another medication with the same name was removed. Continue taking this medication, and follow the directions you see here.   metoprolol tartrate 100 MG tablet Commonly known as: LOPRESSOR Take 1 tablet (100 mg total) by mouth 2 (two) times daily with food What changed: Another medication  with the same name was removed. Continue taking this medication, and follow the directions you see here.   metroNIDAZOLE 500 MG tablet Commonly known as: Flagyl Take 1 tablet (500 mg total) by mouth 3 (three) times daily for 5 days.   Mounjaro 10 MG/0.5ML Pen Generic drug: tirzepatide Inject 10 mg into the skin once a week.   Omnipod 5 DexG7G6 Pods Gen 5 Misc Use as directed every 2 days   ondansetron 4 MG disintegrating tablet Commonly known as: ZOFRAN-ODT Take 1 tablet (4 mg total) by mouth every 8 (eight) hours as needed for nausea or vomiting.   ondansetron 4 MG tablet Commonly known as: ZOFRAN Take 1 tablet (4 mg total) by mouth 2 (two) times daily as needed.   rosuvastatin 40 MG tablet Commonly known as: CRESTOR Take 1 tablet (40 mg total) by mouth daily. Start taking on: June 17, 2023 What changed: These instructions start on June 17, 2023. If you are unsure what to do until then, ask your doctor or other care provider.   Semglee (yfgn) 100 UNIT/ML Pen Generic drug: insulin glargine-yfgn Inject 70 Units into the skin daily. What changed:  how much to take when to take this  TechLite Pen Needles 31G X 8 MM Misc Generic drug: Insulin Pen Needle Use 4-5 times daily as directed   Vascepa 1 g capsule Generic drug: icosapent Ethyl Take 2 capsules (2 g total) by mouth 2 (two) times daily.        Follow-up Information     [facility] Follow up in 1 week(s).   Specialty: Rheumatology Contact information: [address] [place] [address] [phone]                Allergies  Allergen Reactions   Codeine Itching   Duloxetine Hcl Other (See Comments)    Adverse effect - Tremor   Lisinopril Itching   Metoclopramide Hcl Nausea Only    Restless leg     Consultations: None   Procedures/Studies: NM GASTRIC EMPTYING  Result Date: 06/09/2023 CLINICAL DATA:  Gastroparesis EXAM: NUCLEAR MEDICINE GASTRIC EMPTYING  SCAN TECHNIQUE: After oral ingestion of radiolabeled meal, sequential abdominal images were obtained for 120 minutes. Residual percentage of activity remaining within the stomach was calculated at 60 and 120 minutes. RADIOPHARMACEUTICALS:  1.93 mCi Tc-53m sulfur colloid in standardized meal, egg beaters and toast COMPARISON:  CT 06/05/2023 FINDINGS: Expected location of the stomach in the left upper quadrant. Ingested meal empties the stomach gradually over the course of the study with 58% retention at 60 min and 24% retention at 120 min (normal retention less than 30% at a 120 min). IMPRESSION: Normal solid phase gastric emptying. Electronically Signed   By: Karen Kays M.D.   On: 06/09/2023 14:15   MR ABDOMEN MRCP WO CONTRAST  Result Date: 06/07/2023 CLINICAL DATA:  Abnormal liver function tests.  Nausea and vomiting. EXAM: MRI ABDOMEN WITHOUT CONTRAST  (INCLUDING MRCP) TECHNIQUE: Multiplanar multisequence MR imaging of the abdomen was performed. Heavily T2-weighted images of the biliary and pancreatic ducts were obtained, and three-dimensional MRCP images were rendered by post processing. COMPARISON:  CT AP 06/05/2023 FINDINGS: Lower chest: No acute findings. Hepatobiliary: Mildly T2 hyperintense structure along the dome of the liver measures 8 mm, image 8/8. No additional focal liver abnormality. Cholecystectomy. Fusiform dilatation of the common bile duct measures 1.2 cm proximally tapering to 2 mm just before the and below. No signs of choledocholithiasis. No mass identified. Pancreas: No pancreatic inflammation, mass or main duct dilatation identified. Spleen:  Measures 14.1 cm in length.  No focal liver abnormality. Adrenals/Urinary Tract: Normal adrenal glands. No hydronephrosis identified bilaterally. Uniformly T1 hyperintense, slightly exophytic lesion off the upper pole of right kidney measures 1.1 cm, image 38/17. No hydronephrosis identified bilaterally. Stomach/Bowel: Visualized portions within  the abdomen are unremarkable. Vascular/Lymphatic: Aortic atherosclerosis. No aneurysm. No abdominal adenopathy. Other:  No free fluid or fluid collections. Musculoskeletal: No suspicious bone lesions identified. IMPRESSION: 1. Fusiform dilatation of the common bile duct measures 1.2 cm proximally tapering to 2 mm just before the and below. No signs of choledocholithiasis or mass identified. This is favored to represent post cholecystectomy reservoir effect. 2. Mild splenomegaly. 3. Uniformly T1 hyperintense, slightly exophytic lesion off the upper pole of the right kidney measures 1.1 cm. This is favored to represent a hemorrhagic or proteinaceous cyst. Electronically Signed   By: Signa Kell M.D.   On: 06/07/2023 05:39   MR 3D Recon At Scanner  Result Date: 06/07/2023 CLINICAL DATA:  Abnormal liver function tests.  Nausea and vomiting. EXAM: MRI ABDOMEN WITHOUT CONTRAST  (INCLUDING MRCP) TECHNIQUE: Multiplanar multisequence MR imaging of the abdomen was performed. Heavily T2-weighted images of the biliary and  pancreatic ducts were obtained, and three-dimensional MRCP images were rendered by post processing. COMPARISON:  CT AP 06/05/2023 FINDINGS: Lower chest: No acute findings. Hepatobiliary: Mildly T2 hyperintense structure along the dome of the liver measures 8 mm, image 8/8. No additional focal liver abnormality. Cholecystectomy. Fusiform dilatation of the common bile duct measures 1.2 cm proximally tapering to 2 mm just before the and below. No signs of choledocholithiasis. No mass identified. Pancreas: No pancreatic inflammation, mass or main duct dilatation identified. Spleen:  Measures 14.1 cm in length.  No focal liver abnormality. Adrenals/Urinary Tract: Normal adrenal glands. No hydronephrosis identified bilaterally. Uniformly T1 hyperintense, slightly exophytic lesion off the upper pole of right kidney measures 1.1 cm, image 38/17. No hydronephrosis identified bilaterally. Stomach/Bowel:  Visualized portions within the abdomen are unremarkable. Vascular/Lymphatic: Aortic atherosclerosis. No aneurysm. No abdominal adenopathy. Other:  No free fluid or fluid collections. Musculoskeletal: No suspicious bone lesions identified. IMPRESSION: 1. Fusiform dilatation of the common bile duct measures 1.2 cm proximally tapering to 2 mm just before the and below. No signs of choledocholithiasis or mass identified. This is favored to represent post cholecystectomy reservoir effect. 2. Mild splenomegaly. 3. Uniformly T1 hyperintense, slightly exophytic lesion off the upper pole of the right kidney measures 1.1 cm. This is favored to represent a hemorrhagic or proteinaceous cyst. Electronically Signed   By: Signa Kell M.D.   On: 06/07/2023 05:39   CT ABDOMEN PELVIS W CONTRAST  Result Date: 06/05/2023 CLINICAL DATA:  Nausea and vomiting EXAM: CT ABDOMEN AND PELVIS WITH CONTRAST TECHNIQUE: Multidetector CT imaging of the abdomen and pelvis was performed using the standard protocol following bolus administration of intravenous contrast. RADIATION DOSE REDUCTION: This exam was performed according to the departmental dose-optimization program which includes automated exposure control, adjustment of the mA and/or kV according to patient size and/or use of iterative reconstruction technique. CONTRAST:  OMNIPAQUE IOHEXOL 300 MG/ML  SOLN COMPARISON:  12/26/2021 FINDINGS: Lower chest: Calcified granuloma within the left lower lobe. Lung bases are otherwise clear. Coronary artery atherosclerosis. Hepatobiliary: No focal liver abnormality is seen. Status post cholecystectomy. No biliary dilatation. Pancreas: Unremarkable. No pancreatic ductal dilatation or surrounding inflammatory changes. Spleen: Normal in size without focal abnormality. Adrenals/Urinary Tract: Unremarkable adrenal glands. 1.1 cm indeterminate density lesion arises from the upper pole of the right kidney, slightly increased in size compared to  the prior study. Kidneys are otherwise unremarkable. Assessment for renal stones is limited given excreted contrast in the collecting systems. No hydronephrosis. Urinary bladder within normal limits. Stomach/Bowel: Stomach within normal limits. No dilated loops of bowel. Prior appendectomy. Mild circumferential bowel wall thickening of the ascending colon with mild pericolonic fat stranding. No additional sites of focal bowel wall thickening. Vascular/Lymphatic: Scattered aortoiliac atherosclerotic calcifications without aneurysm. No abdominopelvic lymphadenopathy. Reproductive: Uterus and bilateral adnexa are unremarkable. Other: No free fluid. No abdominopelvic fluid collection. No pneumoperitoneum. No abdominal wall hernia. Soft tissue thickening in the subcutaneous fat of the lower anterior abdominal wall is likely injection related. Musculoskeletal: No acute or significant osseous findings. IMPRESSION: 1. Mild circumferential bowel wall thickening of the ascending colon with mild pericolonic fat stranding, suggestive of a nonspecific colitis. 2. Indeterminate density 1.1 cm lesion arises from the upper pole of the right kidney, slightly increased in size compared to the prior study. This likely represents a small cyst complicated by internal hemorrhagic or proteinaceous material. Follow-up nonemergent ultrasound of the kidney is recommended to further assess. 3. Aortic atherosclerosis (ICD10-I70.0). Electronically Signed   By: Janyth Pupa  Plundo D.O.   On: 06/05/2023 17:20   DG Abd 2 Views  Result Date: 06/04/2023 CLINICAL DATA:  Provided history: Bowel obstruction. EXAM: ABDOMEN - 2 VIEW COMPARISON:  CT abdomen/pelvis 12/26/2021. FINDINGS: No appreciable dilated small bowel loops to suggest small bowel obstruction. Bowel gas is present to the level of the sigmoid colon. Small colonic stool burden. No evidence of intraperitoneal free air. Surgical clips within the right upper quadrant of the abdomen. No  acute osseous abnormality identified. Degenerative changes of the spine. Known calcified granuloma within the mid left lung. IMPRESSION: 1. No evidence of small bowel obstruction. 2. Small colonic stool burden. Electronically Signed   By: Jackey Loge D.O.   On: 06/04/2023 14:45   DG Chest Portable 1 View  Result Date: 06/03/2023 CLINICAL DATA:  Tachycardia EXAM: PORTABLE CHEST 1 VIEW COMPARISON:  None Available. FINDINGS: The heart size and mediastinal contours are within normal limits. Calcified granuloma in the left mid lung. Both lungs are clear. The visualized skeletal structures are unremarkable. IMPRESSION: No active disease. Electronically Signed   By: Duanne Guess D.O.   On: 06/03/2023 16:01     Discharge Exam: Vitals:   06/09/23 2048 06/10/23 0427  BP: 121/64 (!) 109/56  Pulse: 75 70  Resp: 16 18  Temp: (!) 97.4 F (36.3 C) 97.6 F (36.4 C)  SpO2: 100% 98%   Vitals:   06/09/23 0443 06/09/23 1344 06/09/23 2048 06/10/23 0427  BP: 109/79 137/75 121/64 (!) 109/56  Pulse: 71 75 75 70  Resp: 18 20 16 18   Temp: 97.7 F (36.5 C) 98.1 F (36.7 C) (!) 97.4 F (36.3 C) 97.6 F (36.4 C)  TempSrc: Oral Oral Oral Oral  SpO2: 98% 98% 100% 98%  Weight:      Height:        General: Pt is alert, awake, not in acute distress Cardiovascular: RRR, S1/S2 +, no rubs, no gallops Respiratory: CTA bilaterally, no wheezing, no rhonchi Abdominal: Soft, NT, ND, bowel sounds + Extremities: no edema, no cyanosis    The results of significant diagnostics from this hospitalization (including imaging, microbiology, ancillary and laboratory) are listed below for reference.     Microbiology: Recent Results (from the past 240 hour(s))  Urine Culture (for pregnant, neutropenic or urologic patients or patients with an indwelling urinary catheter)     Status: Abnormal   Collection Time: 06/05/23  3:15 PM   Specimen: Urine, Clean Catch  Result Value Ref Range Status   Specimen Description    Final    URINE, CLEAN CATCH Performed at [facility], [address] [place], [place] [address]    Special Requests   Final    NONE Performed at [facility], [address], [place], [place] [address]    Culture (A)  Final    <10,000 COLONIES/mL INSIGNIFICANT GROWTH Performed at [facility], [address], [place], [place] [address]    Report Status 06/07/2023 FINAL  Final  Stool culture     Status: None (Preliminary result)   Collection Time: 06/07/23  1:16 PM   Specimen: Stool  Result Value Ref Range Status   Salmonella/Shigella Screen Final report  Final   Campylobacter Culture PENDING  Incomplete   E coli, Shiga toxin Assay Negative Negative Final    Comment: (NOTE) Performed At: [facility] [address] [place], [place] [id number] Jolene Schimke MD VO:[id number]   OVA + PARASITE EXAM     Status: None  Collection Time: 06/07/23  1:16 PM   Specimen: Stool  Result Value Ref Range Status   OVA + PARASITE EXAM Final report  Final    Comment: (NOTE) These results were obtained using wet preparation(s) and trichrome stained smear. This test does not include testing for Cryptosporidium parvum, Cyclospora, or Microsporidia. Performed At: [facility] [address] [place], [place] [id number] Jolene Schimke MD NW:[id number]    Source of Sample STOOL  Final    Comment: Performed at [facility], [address] [place], [place] [address]  STOOL CULTURE REFLEX - RSASHR     Status: None   Collection Time: 06/07/23  1:16 PM  Result Value Ref Range Status   Stool Culture result 1 (RSASHR) Comment  Final    Comment: (NOTE) No Salmonella or Shigella recovered. Performed At: [facility] [address] [place], [place] [id number] Jolene Schimke MD LK:[id number]      Labs: BNP (last 3 results) No results for input(s): "BNP" in the last 8760 hours. Basic Metabolic Panel: Recent  Labs  Lab 06/04/23 1708 06/05/23 1022 06/06/23 0604 06/07/23 0851 06/08/23 0704 06/09/23 0701 06/10/23 0526  NA  --  140 137 137 140 139 139  K  --  3.6 3.6 3.4* 3.7 3.2* 3.7  CL  --  107 107 106 108 104 104  CO2  --  22 22 23 23 24 27   GLUCOSE  --  95 74 114* 80 82 117*  BUN  --  14 14 10 9 9 12   CREATININE  --  0.81 0.85 0.90 0.88 0.87 0.94  CALCIUM  --  9.5 8.9 8.6* 8.9 9.0 9.0  MG 1.5* 2.1 1.7  --  1.4*  --  1.6*   Liver Function Tests: Recent Labs  Lab 06/06/23 0604 06/07/23 0851 06/08/23 0704 06/09/23 0701 06/10/23 0526  AST 207* 146* 64* 35 20  ALT 171* 189* 122* 86* 60*  ALKPHOS 164* 269* 212* 185* 152*  BILITOT 1.1 0.7 0.8 0.7 0.4  PROT 6.0* 6.2* 6.0* 5.9* 5.8*  ALBUMIN 3.4* 3.6 3.4* 3.5 3.4*   Recent Labs  Lab 06/04/23 1112 06/06/23 1833  LIPASE 44 35   No results for input(s): "AMMONIA" in the last 168 hours. CBC: Recent Labs  Lab 06/03/23 1405 06/04/23 0118 06/05/23 1022 06/06/23 0604 06/10/23 0526  WBC 8.7 5.6 9.8 6.9 3.5*  NEUTROABS  --   --  7.7 5.3  --   HGB 14.1 11.2* 12.3 10.4* 10.3*  HCT 39.9 33.1* 35.8* 30.3* 30.6*  MCV 87.3 91.4 93.2 92.4 92.4  PLT 275 197 209 153 171   Cardiac Enzymes: Recent Labs  Lab 06/06/23 1833  CKTOTAL 26*   BNP: Invalid input(s): "POCBNP" CBG: Recent Labs  Lab 06/09/23 0743 06/09/23 0814 06/09/23 1657 06/09/23 2049 06/10/23 0733  GLUCAP 69* 147* 147* 188* 108*   D-Dimer No results for input(s): "DDIMER" in the last 72 hours. Hgb A1c No results for input(s): "HGBA1C" in the last 72 hours. Lipid Profile No results for input(s): "CHOL", "HDL", "LDLCALC", "TRIG", "CHOLHDL", "LDLDIRECT" in the last 72 hours. Thyroid function studies No results for input(s): "TSH", "T4TOTAL", "T3FREE", "THYROIDAB" in the last 72 hours.  Invalid input(s): "FREET3" Anemia work up No results for input(s): "VITAMINB12", "FOLATE", "FERRITIN", "TIBC", "IRON", "RETICCTPCT" in the last 72 hours. Urinalysis     Component Value Date/Time   COLORURINE YELLOW 06/04/2023 1338   APPEARANCEUR HAZY (A) 06/04/2023 1338   LABSPEC 1.011 06/04/2023 1338   PHURINE  5.0 06/04/2023 1338   GLUCOSEU NEGATIVE 06/04/2023 1338   HGBUR SMALL (A) 06/04/2023 1338   BILIRUBINUR NEGATIVE 06/04/2023 1338   KETONESUR NEGATIVE 06/04/2023 1338   PROTEINUR NEGATIVE 06/04/2023 1338   UROBILINOGEN 0.2 01/05/2009 2105   NITRITE NEGATIVE 06/04/2023 1338   LEUKOCYTESUR MODERATE (A) 06/04/2023 1338   Sepsis Labs Recent Labs  Lab 06/04/23 0118 06/05/23 1022 06/06/23 0604 06/10/23 0526  WBC 5.6 9.8 6.9 3.5*   Microbiology Recent Results (from the past 240 hour(s))  Urine Culture (for pregnant, neutropenic or urologic patients or patients with an indwelling urinary catheter)     Status: Abnormal   Collection Time: 06/05/23  3:15 PM   Specimen: Urine, Clean Catch  Result Value Ref Range Status   Specimen Description   Final    URINE, CLEAN CATCH Performed at [facility], [address] [place], [place] [address]    Special Requests   Final    NONE Performed at [facility], [address], [place], [place] [address]    Culture (A)  Final    <10,000 COLONIES/mL INSIGNIFICANT GROWTH Performed at [facility], [address], [place], [place] [address]    Report Status 06/07/2023 FINAL  Final  Stool culture     Status: None (Preliminary result)   Collection Time: 06/07/23  1:16 PM   Specimen: Stool  Result Value Ref Range Status   Salmonella/Shigella Screen Final report  Final   Campylobacter Culture PENDING  Incomplete   E coli, Shiga toxin Assay Negative Negative Final    Comment: (NOTE) Performed At: [facility] [address] [place], [place] [id number] Jolene Schimke MD IO:[id number]   OVA + PARASITE EXAM     Status: None   Collection Time: 06/07/23  1:16 PM   Specimen: Stool  Result Value Ref Range Status   OVA + PARASITE EXAM Final report  Final     Comment: (NOTE) These results were obtained using wet preparation(s) and trichrome stained smear. This test does not include testing for Cryptosporidium parvum, Cyclospora, or Microsporidia. Performed At: [facility] [address] [place], [place] [id number] Jolene Schimke MD ZD:[id number]    Source of Sample STOOL  Final    Comment: Performed at [facility], [address], [place], [place] [address]  STOOL CULTURE REFLEX - RSASHR     Status: None   Collection Time: 06/07/23  1:16 PM  Result Value Ref Range Status   Stool Culture result 1 (RSASHR) Comment  Final    Comment: (NOTE) No Salmonella or Shigella recovered. Performed At: [facility] [address] [place], [place] [id number] Jolene Schimke MD JJ:[id number]     FURTHER DISCHARGE INSTRUCTIONS:   Get Medicines reviewed and adjusted: Please take all your medications with you for your next visit with your Primary MD   Laboratory/radiological data: Please request your Primary MD to go over all hospital tests and procedure/radiological results at the follow up, please ask your Primary MD to get all Hospital records sent to his/her office.   In some cases, they will be blood work, cultures and biopsy results pending at the time of your discharge. Please request that your primary care M.D. goes through all the records of your hospital data and follows up on these results.   Also Note the following: If you experience worsening of your admission symptoms, develop shortness of breath, life threatening emergency, suicidal or homicidal thoughts you must seek medical attention immediately by calling  911 or calling your MD immediately  if symptoms less severe.   You must read complete instructions/literature along with all the possible adverse reactions/side effects for all the Medicines you take and that have been prescribed to you. Take any new Medicines after you have completely understood  and accpet all the possible adverse reactions/side effects.    Do not drive when taking Pain medications or sleeping medications (Benzodaizepines)   Do not take more than prescribed Pain, Sleep and Anxiety Medications. It is not advisable to combine anxiety,sleep and pain medications without talking with your primary care practitioner   Special Instructions: If you have smoked or chewed Tobacco  in the last 2 yrs please stop smoking, stop any regular Alcohol  and or any Recreational drug use.   Wear Seat belts while driving.   Please note: You were cared for by a hospitalist during your hospital stay. Once you are discharged, your primary care physician will handle any further medical issues. Please note that NO REFILLS for any discharge medications will be authorized once you are discharged, as it is imperative that you return to your primary care physician (or establish a relationship with a primary care physician if you do not have one) for your post hospital discharge needs so that they can reassess your need for medications and monitor your lab values  Time coordinating discharge: Over 30 minutes  SIGNED:   Hughie Closs, MD  [facility] 06/10/2023, 11:01 AM *Please note that this is a verbal dictation therefore any spelling or grammatical errors are due to the "Dragon Medical One" system interpretation. If 7PM-7AM, please contact night-coverage [email or web address]

## 2023-06-11 LAB — STOOL CULTURE: E coli, Shiga toxin Assay: NEGATIVE

## 2023-06-11 LAB — STOOL CULTURE REFLEX - RSASHR

## 2023-06-11 LAB — STOOL CULTURE REFLEX - CMPCXR

## 2023-06-15 DIAGNOSIS — K529 Noninfective gastroenteritis and colitis, unspecified: Secondary | ICD-10-CM | POA: Diagnosis not present

## 2023-06-15 DIAGNOSIS — Z09 Encounter for follow-up examination after completed treatment for conditions other than malignant neoplasm: Secondary | ICD-10-CM | POA: Diagnosis not present

## 2023-06-15 DIAGNOSIS — R000 Tachycardia, unspecified: Secondary | ICD-10-CM | POA: Diagnosis not present

## 2023-06-15 DIAGNOSIS — E1165 Type 2 diabetes mellitus with hyperglycemia: Secondary | ICD-10-CM | POA: Diagnosis not present

## 2023-06-15 DIAGNOSIS — R002 Palpitations: Secondary | ICD-10-CM | POA: Diagnosis not present

## 2023-06-15 DIAGNOSIS — R531 Weakness: Secondary | ICD-10-CM | POA: Diagnosis not present

## 2023-06-16 DIAGNOSIS — H5213 Myopia, bilateral: Secondary | ICD-10-CM | POA: Diagnosis not present

## 2023-06-17 ENCOUNTER — Other Ambulatory Visit (HOSPITAL_COMMUNITY): Payer: Self-pay

## 2023-06-17 DIAGNOSIS — E538 Deficiency of other specified B group vitamins: Secondary | ICD-10-CM | POA: Diagnosis not present

## 2023-06-17 DIAGNOSIS — R002 Palpitations: Secondary | ICD-10-CM | POA: Diagnosis not present

## 2023-06-17 DIAGNOSIS — E1165 Type 2 diabetes mellitus with hyperglycemia: Secondary | ICD-10-CM | POA: Diagnosis not present

## 2023-06-17 DIAGNOSIS — Z9641 Presence of insulin pump (external) (internal): Secondary | ICD-10-CM | POA: Diagnosis not present

## 2023-06-17 DIAGNOSIS — I10 Essential (primary) hypertension: Secondary | ICD-10-CM | POA: Diagnosis not present

## 2023-06-17 MED ORDER — MOUNJARO 5 MG/0.5ML ~~LOC~~ SOAJ
5.0000 mg | SUBCUTANEOUS | 1 refills | Status: DC
  Filled 2023-06-17: qty 2, 28d supply, fill #0
  Filled 2023-09-06: qty 2, 28d supply, fill #1

## 2023-06-21 ENCOUNTER — Ambulatory Visit: Payer: Commercial Managed Care - PPO | Attending: Cardiology | Admitting: Cardiology

## 2023-06-21 ENCOUNTER — Ambulatory Visit: Payer: Commercial Managed Care - PPO

## 2023-06-21 ENCOUNTER — Encounter: Payer: Self-pay | Admitting: Cardiology

## 2023-06-21 ENCOUNTER — Other Ambulatory Visit (HOSPITAL_COMMUNITY): Payer: Self-pay

## 2023-06-21 VITALS — BP 114/78 | HR 126 | Ht 70.0 in | Wt 156.0 lb

## 2023-06-21 DIAGNOSIS — E782 Mixed hyperlipidemia: Secondary | ICD-10-CM | POA: Diagnosis not present

## 2023-06-21 DIAGNOSIS — R0602 Shortness of breath: Secondary | ICD-10-CM | POA: Diagnosis not present

## 2023-06-21 DIAGNOSIS — I10 Essential (primary) hypertension: Secondary | ICD-10-CM

## 2023-06-21 DIAGNOSIS — I2510 Atherosclerotic heart disease of native coronary artery without angina pectoris: Secondary | ICD-10-CM | POA: Diagnosis not present

## 2023-06-21 DIAGNOSIS — R002 Palpitations: Secondary | ICD-10-CM | POA: Diagnosis not present

## 2023-06-21 DIAGNOSIS — R000 Tachycardia, unspecified: Secondary | ICD-10-CM | POA: Diagnosis not present

## 2023-06-21 DIAGNOSIS — E1110 Type 2 diabetes mellitus with ketoacidosis without coma: Secondary | ICD-10-CM

## 2023-06-21 MED ORDER — METOPROLOL TARTRATE 100 MG PO TABS
150.0000 mg | ORAL_TABLET | Freq: Two times a day (BID) | ORAL | 3 refills | Status: DC
  Filled 2023-06-21: qty 270, 90d supply, fill #0

## 2023-06-21 NOTE — Patient Instructions (Addendum)
Medication Instructions:  Your physician has recommended you make the following change in your medication:   INCREASE: metoprolol tartrate (LOPRESSOR) 100 MG tablet -Take 1.5 tablets (150 mg total) by mouth 2 (two) times daily  *If you need a refill on your cardiac medications before your next appointment, please call your pharmacy*   Lab Work: Your provider would like for you to have following labs drawn today BMET & Magnesium.   If you have labs (blood work) drawn today and your tests are completely normal, you will receive your results only by: MyChart Message (if you have MyChart) OR A paper copy in the mail If you have any lab test that is abnormal or we need to change your treatment, we will call you to review the results.  Testing/Procedures: Your physician has requested that you have an echocardiogram. Echocardiography is a painless test that uses sound waves to create images of your heart. It provides your doctor with information about the size and shape of your heart and how well your heart's chambers and valves are working. This procedure takes approximately one hour. There are no restrictions for this procedure. Please do NOT wear cologne, perfume, aftershave, or lotions (deodorant is allowed). Please arrive 15 minutes prior to your appointment time.  Please note: We ask at that you not bring children with you during ultrasound (echo/ vascular) testing. Due to room size and safety concerns, children are not allowed in the ultrasound rooms during exams. Our front office staff cannot provide observation of children in our lobby area while testing is being conducted. An adult accompanying a patient to their appointment will only be allowed in the ultrasound room at the discretion of the ultrasound technician under special circumstances. We apologize for any inconvenience.   Heart Monitor:  Your physician has requested you wear a ZIO AT (live) patch heart monitor for 14 days.  Your  monitor will be mailed to your home address within 3-5 business days. This is sent via [employer] from [employer]. However, if you have not received your monitor after 5 business days please send [place] a MyChart message or call the office at [phone], so we may follow up on this for you.   This monitor is a medical device (single patch monitor) that records the heart's electrical activity. Doctors most often use these monitors to diagnose arrhythmias. Arrhythmias are problems with the speed or rhythm of the heartbeat.   [employer] supplies 1 patch per enrollment. Additional stickers are not available.  Please DO NOT apply the patch if you will be having a Nuclear Stress Test, Echocardiogram, Cardiac CT, Cardiac MRI, Chest X-ray during the period you would be wearing the monitor. The patch cannot be worn during these tests.  YouTube search Zio Patch Heart Monitor (3:46) for step-by-step application instructions You cannot remove and re-apply the ZIO patch monitor.   Applying the Monitor: Once you receive your monitor, this will include a small razor, abrader, and 4 alcohol pads. Shave hair from upper left chest Rub abrader disc in 40 strokes over the left upper chest as indicated in your monitor instructions Clean area with 4 enclosed alcohol pads (there may be a mild & brief stinging sensation over the newly abraded area, but this is normal). Let dry Apply patch as indicated in monitor instructions. Patch will be placed under collarbone on the left side of the chest with arrow pointing upward. Rub adhesive wings for 2 minutes. Remove white label marked "1". Remove the white  label marked "2". Rub patch adhesive wings for an 2 minutes.  While looking in a mirror, press and release button in the center of the patch. You may hear a "click". A small green light will flash 4-6 times and then stop. This will be your indicator that the monitor has been turned on.  Wearing the Monitor: Avoid  showering during the first 24 hours of wearing the monitor.  After 24 hours you may shower with the patch on. Take brief showers with your back facing the shower head.  Avoid excessive sweating to help maximize wear time. Do not submerge the device, no hot tubs, and no swimming pools. Keep any lotions or oils away from the patch. Press the button if you feel a symptom. You will hear a small click. Record date, time, and symptoms in the Patient Logbook or App.  Monitor Issues: Call [employer] Customer Care at [phone] if you have questions regarding your Zio Patch Monitor. Call them immediately if you see an orange/ amber colored light blinking on your monitor. If your monitor falls off and you cannot get this reapplied or if you need suggestions for securing your monitor call [employer] at [phone].   Returning the Monitor: Once you have completed wearing your monitor, follow instructions on the last 2 pages of the Patient Logbook. Stick monitor patch on to the last page of the Patient Logbook.  Place Patient Logbook with monitor in the return box provided. Use locking tab on box and tape box closed securely. The return box has pre-paid postage on it.  Place the return box in the regular [place] Mail box as soon as possible It will take anywhere from 1-2 weeks for your provider to receive and review your results once you mail this back. If for some reason you have misplaced your return box then call our office and we can provide another box and/or mail it off for you.   Billing  and Patient Assistance Program Information: We have supplied [employer] with any of your insurance information on file for billing purposes. [employer] offers a sliding scale Patient Assistance Program for patients that do not have insurance, or whose insurance does not completely cover the cost of the ZIO monitor. You must apply for the Patient Assistance Program to qualify for this discounted rate. To apply,  please call [employer] at [phone], select option 1, ask to apply for the Patient Assistance Program. [employer] will ask your household income, and how many people are in your household. They will quote your out-of-pocket cost based on that information. [employer] will also be able to set up for a 72-month, interest-free payment plan if needed.     Follow-Up: At [facility], you and your health needs are our priority.  As part of our continuing mission to provide you with exceptional heart care, we have created designated Provider Care Teams.  These Care Teams include your primary Cardiologist (physician) and Advanced Practice Providers (APPs -  Physician Assistants and Nurse Practitioners) who all work together to provide you with the care you need, when you need it.  Your next appointment:   1 week(s)  Provider:   Charlsie Quest, NP    Other Instructions - None

## 2023-06-21 NOTE — Progress Notes (Addendum)
Cardiology Office Note:  .   Date:  06/21/2023  ID:  Duard Brady, DOB 20-Oct-1965, MRN [id number] PCP: [facility]  [facility] Providers Cardiologist:  None    History of Present Illness: Alexis Campos is a 57 y.o. female with a past medical history of hypertension, hyperlipidemia, family history of coronary artery disease, type 2 diabetes, obesity, anemia, who is here today for hospital follow-up.  Previous left heart catheterization was completed in 01/2000 which showed normal coronaries at the time.  Stress test several years later which showed an episode of SVT during stress test and received adenosine for treatment.  Coronary calcium score 59.8 2022.  She was last seen in clinic 11/03/2022 by Dr. Elease Hashimoto.  At that time she had complaints of chest discomfort.  She had lost about 30 pounds.  LDL was 142.  They reviewed prior coronary CTA from July 2022.  She was sent for labs.  No further testing was ordered.  [facility] with several days of nausea vomiting and inability to tolerate oral intake and admitted under the hospitalist service.  CT of the abdomen the time of initial admission showed a ascending colon colitis.  Patient had intermittent vomiting but continues to remain nauseous.  She started having diarrhea on the morning of 06/07/2023, stool culture and ova and parasite were negative.  She been on Rocephin and Flagyl and did receive 5 days of antibiotics.  Her symptoms improved but gradually.  Diet was also introduced and she was started on clear liquids and gradually advance diet to soft as she tolerated.  Due to persistent symptoms it was suspected she had gastroparesis and she was scheduled for gastric emptying study which was surprisingly normal.  Patient's symptoms could be combination of acute colitis and the additional use of Mounjaro.  She was discharged with 5 more days of Cipro and Flagyl to treat  the colitis however she has been advised to discuss her Mounjaro with her endocrinologist.  She was also treated for a UTI.  Electrolyte abnormalities were corrected during her hospitalization.  She had elevated LFTs but MRCP was negative for any acute pathology.  Crestor and fenofibrate were held due to elevated LFTs and recommended holding for another week and repeat labs when she followed up with her PCP.  She was considered stable for discharge and was discharged from the hospital on 06/06/2023.  She returns to clinic today with continued complaints of rapid heart rate that started back today, lightheadedness,and occasional dizziness. She was recently discharged from the hospital and notes continued decreased oral intake. She has had some associated shortness of breath which has caused some left arm heaviness. She states that she recently had labs drawn by her PCP. She has finished her antibiotics and has been compliant with her current medication regimen.   ROS: 10 point review of systems has been reviewed and considered negative with exception was been listed in the HPI  Studies Reviewed: Marland Kitchen   EKG Interpretation Date/Time:  Monday June 21 2023 15:13:32 EST Ventricular Rate:  126 PR Interval:  154 QRS Duration:  74 QT Interval:  294 QTC Calculation: 425 R Axis:   12  Text Interpretation: Sinus tachycardia Low voltage QRS When compared with ECG of 05-Jun-2023 09:20, Vent. rate has increased BY  48 BPM Confirmed by Charlsie Quest ([id number]) on 06/21/2023 3:15:21 PM   Coronary CTA 02/20/21 IMPRESSION: 1. Coronary calcium score of 59.8. This was  92nd percentile for age and sex matched control.   2. Normal coronary origin with right dominance.   3. LAD and LCx calcifications causing minimal stenosis (<25%).   4. CAD-RADS 1. Minimal non-obstructive CAD (0-24%). Consider non-atherosclerotic causes of chest pain. Consider preventive therapy and risk factor modification. Risk  Assessment/Calculations:             Physical Exam:   VS:  BP 114/78 (BP Location: Left Arm, Patient Position: Sitting, Cuff Size: Normal)   Pulse (!) 126   Ht 5\' 10"  (1.778 m)   Wt 156 lb (70.8 kg)   LMP 06/14/2018 (Approximate)   SpO2 97%   BMI 22.38 kg/m    Wt Readings from Last 3 Encounters:  06/21/23 156 lb (70.8 kg)  06/03/23 164 lb 0.4 oz (74.4 kg)  11/03/22 198 lb 9.6 oz (90.1 kg)    GEN: Well nourished, well developed in no acute distress NECK: No JVD; No carotid bruits CARDIAC: RRR, tachycardic, no murmurs, rubs, gallops RESPIRATORY:  Clear to auscultation without rales, wheezing or rhonchi  ABDOMEN: Soft, non-tender, non-distended EXTREMITIES:  No edema; No deformity   ASSESSMENT AND PLAN: .   Palpitations that have been worsening since hospital discharge.  EKG today reveals sinus tachycardia with a rate of 126.  She has been scheduled for BMP and a mag level.  Since discharge she states that she has not been maintaining adequate hydration so is likely driving her tachycardia.  She has been scheduled for a ZIO for palpitations as well.  She is continued on metoprolol with increasing her dose slightly to 150 twice daily.  She has been advised with underlying factors driving her tachycardia and that the amount of metoprolol that she takes will likely not impact her heart rate.  She needs to continue to monitor heart rate and her blood pressure at home for continuing elevations in her heart rate without resolution will require return to the emergency department.  Shortness of breath since discharge from the hospital likely exacerbated from tachycardia.  She has recently been diagnosed with colitis and will required IV hydration and antibiotic therapy.  She states shortness of breath has gradually been worsening she has been scheduled for an echocardiogram.  Mixed hyperlipidemia with an LDL of 80 on 02/15/23.  She is continued on rosuvastatin 40 mg daily.  Medication was held  during hospitalization for elevated LFTs.  Type 2 diabetes with hyperglycemia.  Elevated A1c.  She is continued on insulin.  This continues to be managed by her PCP.  Nonobstructive coronary artery disease where she denies angina or anginal equivalents with twelve-lead EKG today revealed sinus tachycardia with rate of 126 with no ST-T wave abnormalities noted.  No further testing at this time.  She is continued on statin of rosuvastatin and fenofibrate as well as Vascepa for triglycerides.  Primary hypertension with blood pressure today 114/78.  She is continued on metoprolol 150 mg twice daily.  Encouraged to continue to monitor pressure 1 to 2 hours postmedication administration at home as well.        Dispo: Patient to return to clinic to see MD/APP in 1 week or sooner if needed for reevaluation of symptoms.  Patient's case with DOD Dr. Mariah Milling.  He agrees to something underlying is driving her tachycardia and suggested that she schedule an echocardiogram and increase hydration with labs being done today.  He did not feel as though her sinus tachycardia was fast enough to indicate emergency department evaluation at this  time.  Signed, Rekita Miotke, NP

## 2023-06-22 ENCOUNTER — Other Ambulatory Visit (HOSPITAL_COMMUNITY): Payer: Self-pay

## 2023-06-22 LAB — BASIC METABOLIC PANEL
BUN/Creatinine Ratio: 25 — ABNORMAL HIGH (ref 9–23)
BUN: 28 mg/dL — ABNORMAL HIGH (ref 6–24)
CO2: 19 mmol/L — ABNORMAL LOW (ref 20–29)
Calcium: 9.8 mg/dL (ref 8.7–10.2)
Chloride: 103 mmol/L (ref 96–106)
Creatinine, Ser: 1.11 mg/dL — ABNORMAL HIGH (ref 0.57–1.00)
Glucose: 156 mg/dL — ABNORMAL HIGH (ref 70–99)
Potassium: 4.3 mmol/L (ref 3.5–5.2)
Sodium: 139 mmol/L (ref 134–144)
eGFR: 58 mL/min/{1.73_m2} — ABNORMAL LOW (ref >59)

## 2023-06-22 LAB — MAGNESIUM: Magnesium: 1.4 mg/dL — ABNORMAL LOW (ref 1.6–2.3)

## 2023-06-22 MED ORDER — VALACYCLOVIR HCL 1 G PO TABS
1000.0000 mg | ORAL_TABLET | Freq: Every day | ORAL | 1 refills | Status: AC
  Filled 2023-06-22: qty 30, 30d supply, fill #0
  Filled 2024-02-08: qty 30, 30d supply, fill #1

## 2023-06-22 NOTE — Progress Notes (Signed)
Elevated in kidney function to suggest dehydration. Increase water intake. If continued dizziness, lightheadedness, and tachycardia. Recommend return to the emergency department for IV hydration.

## 2023-06-25 DIAGNOSIS — R002 Palpitations: Secondary | ICD-10-CM

## 2023-06-29 DIAGNOSIS — I4719 Other supraventricular tachycardia: Secondary | ICD-10-CM | POA: Diagnosis not present

## 2023-06-30 ENCOUNTER — Ambulatory Visit: Payer: Commercial Managed Care - PPO | Attending: Cardiology | Admitting: Cardiology

## 2023-06-30 ENCOUNTER — Other Ambulatory Visit (HOSPITAL_COMMUNITY): Payer: Self-pay

## 2023-06-30 ENCOUNTER — Encounter: Payer: Self-pay | Admitting: Cardiology

## 2023-06-30 VITALS — BP 144/86 | HR 105 | Ht 70.0 in | Wt 157.0 lb

## 2023-06-30 DIAGNOSIS — R000 Tachycardia, unspecified: Secondary | ICD-10-CM

## 2023-06-30 DIAGNOSIS — E782 Mixed hyperlipidemia: Secondary | ICD-10-CM | POA: Diagnosis not present

## 2023-06-30 DIAGNOSIS — I10 Essential (primary) hypertension: Secondary | ICD-10-CM | POA: Diagnosis not present

## 2023-06-30 DIAGNOSIS — R0602 Shortness of breath: Secondary | ICD-10-CM | POA: Diagnosis not present

## 2023-06-30 DIAGNOSIS — E612 Magnesium deficiency: Secondary | ICD-10-CM | POA: Diagnosis not present

## 2023-06-30 DIAGNOSIS — R002 Palpitations: Secondary | ICD-10-CM

## 2023-06-30 DIAGNOSIS — I2510 Atherosclerotic heart disease of native coronary artery without angina pectoris: Secondary | ICD-10-CM | POA: Diagnosis not present

## 2023-06-30 DIAGNOSIS — N179 Acute kidney failure, unspecified: Secondary | ICD-10-CM | POA: Diagnosis not present

## 2023-06-30 DIAGNOSIS — E1110 Type 2 diabetes mellitus with ketoacidosis without coma: Secondary | ICD-10-CM | POA: Diagnosis not present

## 2023-06-30 DIAGNOSIS — R55 Syncope and collapse: Secondary | ICD-10-CM | POA: Diagnosis not present

## 2023-06-30 MED ORDER — KP MAG-OXIDE MAGNESIUM 200 MG PO TABS
ORAL_TABLET | ORAL | 3 refills | Status: AC
  Filled 2023-06-30: qty 94, fill #0

## 2023-06-30 MED ORDER — METOPROLOL TARTRATE 100 MG PO TABS
200.0000 mg | ORAL_TABLET | Freq: Two times a day (BID) | ORAL | 3 refills | Status: DC
  Filled 2023-06-30: qty 360, 90d supply, fill #0

## 2023-06-30 NOTE — Patient Instructions (Signed)
Medication Instructions:  Your physician has recommended you make the following change in your medication:  START: metoprolol tartrate (LOPRESSOR) 100 MG tablet - Take 2 tablets (200 mg total) by mouth 2 (two) times daily  Magnesium Oxide (KP MAG-OXIDE MAGNESIUM) 200 mg tablets - Take 2 tablets (400 mg) by mouth for 2 days then take 1 tablet (200 mg) by mouth daily thereafter  *If you need a refill on your cardiac medications before your next appointment, please call your pharmacy*  Lab Work: - None ordered  Testing/Procedures: - None ordered  Follow-Up: At [facility], you and your health needs are our priority.  As part of our continuing mission to provide you with exceptional heart care, we have created designated Provider Care Teams.  These Care Teams include your primary Cardiologist (physician) and Advanced Practice Providers (APPs -  Physician Assistants and Nurse Practitioners) who all work together to provide you with the care you need, when you need it.  Your next appointment:   4 - 6 week(s)  Provider:   Charlsie Quest, NP    Other Instructions - None

## 2023-06-30 NOTE — Progress Notes (Signed)
Cardiology Office Note:  .   Date:  06/30/2023  ID:  Alexis Campos, DOB 06-Apr-1966, MRN [id number] PCP: [facility]  [facility] Providers Cardiologist:  None    History of Present Illness: Alexis Campos is a 57 y.o. female with a past medical history of hypertension, hyperlipidemia, family history of coronary artery disease, type 2 diabetes, obesity, anemia, who is here today for follow-up.   Previous left heart catheterization was completed in 01/2000 which showed normal coronaries at that time.  Stress testing several years later she had an episode of SVT during stress testing and she received adenosine for treatment.  Coronary calcium score of 59.8 in 2022.  She was recently hospitalized 06/03/2023 after presenting [facility] for several days of nausea vomiting inability to tolerate oral intake and was admitted under the hospitalist service.  CT of the abdomen at that time of initial diagnosis showed descending colon colitis.  She had intermittent vomiting and continues to remain nauseous.  She started having diarrhea on the morning of 06/07/2023 stool culture ova parasites were negative.  She felt Rocephin and Flagyl for approximately 5 days.  She was treated for UTI, electrolyte derangements were corrected.  She had elevated LFTs but MRCP was negative for any acute pathology.  Rosuvastatin and fenofibrate were held due to elevated LFTs and recommended holding for another week and repeat labs when she followed up with her PCP.  She was subsequently considered stable for discharge and was discharged on 06/14/2023.   She was last seen in clinic 11//24.  At that time she continued to complain of rapid heart.  Had started back today with occasional lightheadedness and dizziness.  She noted she continued to have decreased oral intake.  She was scheduled for BMP and mag level because she was tachycardic with heart rate in the 120s.  She was  scheduled for COVID for palpitations as well.  Metoprolol dosing was increased 250 twice daily but she was advised that likely her tachycardia was driven by different source that medication would not control with her recent dehydration and continued symptoms it was likely a contributing component.  She returns to clinic today still with complaints of palpitations, tachycardia, and fatigue.  This is also associated with some shortness of breath but she denies any chest pain.  She is a little discouraged today as she was anticipating feeling better than when she does this many days out of the hospital.  Heart rate is slightly better controlled at 105 today after recent increase of her metoprolol dosing.  She states that she also has another follow-up with her primary care provider and more blood work to reevaluate her kidney function as her last labs that were drawn continued to show some dehydration.  She states that she has been compliant with her current medication regimen.  She has her echocardiogram that is scheduled and continues to have her ZIO monitor on today.  ROS: 10 point review of systems has been reviewed and considered negative with exception what is been listed in the HPI  Studies Reviewed: Marland Kitchen        Coronary CTA 02/20/21 IMPRESSION: 1. Coronary calcium score of 59.8. This was 92nd percentile for age and sex matched control.   2. Normal coronary origin with right dominance.   3. LAD and LCx calcifications causing minimal stenosis (<25%).   4. CAD-RADS 1. Minimal non-obstructive CAD (0-24%). Consider non-atherosclerotic causes of chest pain. Consider preventive  therapy and risk factor modification. Risk Assessment/Calculations:          Physical Exam:   VS:  BP (!) 144/86 (BP Location: Left Arm, Patient Position: Sitting, Cuff Size: Normal)   Pulse (!) 105   Ht 5\' 10"  (1.778 m)   Wt 157 lb (71.2 kg)   LMP 06/14/2018 (Approximate)   SpO2 100%   BMI 22.53 kg/m    Wt Readings  from Last 3 Encounters:  06/30/23 157 lb (71.2 kg)  06/21/23 156 lb (70.8 kg)  06/03/23 164 lb 0.4 oz (74.4 kg)    GEN: Well nourished, well developed in no acute distress NECK: No JVD; No carotid bruits CARDIAC: RRR, tachycardic, no murmurs, rubs, gallops RESPIRATORY:  Clear to auscultation without rales, wheezing or rhonchi  ABDOMEN: Soft, non-tender, non-distended EXTREMITIES:  No edema; No deformity   ASSESSMENT AND PLAN: .   Palpitations have been worsening since hospital discharge.  She continues to have a ZIO monitor on today.  Metoprolol was increased to 150 mg twice daily on her last visit.  Today her metoprolol was increased to 200 mg twice daily and she is concerned and asking why she continues to have tachycardia and palpitations.  She has been instructed this is likely related to her underlying call some.  Blood work revealed some continued dehydration and also a low magnesium.  So long with increasing her metoprolol she has been started on magnesium oxide today 400 mg daily x 2 days then 200 mg daily with repeat labs. (Advised that she would not stay on magnesium very long as having too much magnesium will cause worsening diarrhea).  Continued shortness of breath associated with tachycardia and palpitations.  She has an echocardiogram that is ordered and pending.  Mixed hyperlipidemia with an LDL of 80 on 02/15/2023.  She is continue rosuvastatin 40 mg daily.  Medications were held during hospitalization due to elevated LFTs.  They have been restarted and she is continued on fenofibrate, Vascepa, and rosuvastatin.  Type 2 diabetes with hyperglycemia with an elevated A1c.  She is continued on insulin.  Continues to be managed by PCP.  Nonobstructive coronary artery disease where she denies angina or anginal equivalents.  She remains to be tachycardic on EKG today with a rate of 105 but no other ischemic changes have been noted.  She is continued on fenofibrate, Vascepa, and  rosuvastatin.  Primary hypertension with blood pressure today 144/86.  Blood pressure today is slightly elevated metoprolol has been increased.  She has been encouraged to continue monitor blood pressure 1 to 2 hours postmedication administration as well.       Dispo: Patient to return to clinic to see MD/APP in 4 to 6 weeks or sooner if needed for reevaluation of symptoms  Signed, Seriyah Collison, NP

## 2023-07-02 ENCOUNTER — Other Ambulatory Visit (HOSPITAL_BASED_OUTPATIENT_CLINIC_OR_DEPARTMENT_OTHER): Payer: Self-pay

## 2023-07-07 ENCOUNTER — Other Ambulatory Visit (HOSPITAL_COMMUNITY): Payer: Self-pay

## 2023-07-07 DIAGNOSIS — Z83719 Family history of colon polyps, unspecified: Secondary | ICD-10-CM | POA: Diagnosis not present

## 2023-07-07 DIAGNOSIS — R935 Abnormal findings on diagnostic imaging of other abdominal regions, including retroperitoneum: Secondary | ICD-10-CM | POA: Diagnosis not present

## 2023-07-07 DIAGNOSIS — Z800 Family history of malignant neoplasm of digestive organs: Secondary | ICD-10-CM | POA: Diagnosis not present

## 2023-07-07 DIAGNOSIS — Z8679 Personal history of other diseases of the circulatory system: Secondary | ICD-10-CM | POA: Diagnosis not present

## 2023-07-07 MED ORDER — SUTAB 1479-225-188 MG PO TABS
ORAL_TABLET | ORAL | 0 refills | Status: AC
  Filled 2023-07-07: qty 24, 1d supply, fill #0

## 2023-07-08 ENCOUNTER — Other Ambulatory Visit (HOSPITAL_COMMUNITY): Payer: Self-pay

## 2023-07-12 ENCOUNTER — Other Ambulatory Visit (HOSPITAL_COMMUNITY): Payer: Self-pay

## 2023-07-13 ENCOUNTER — Other Ambulatory Visit: Payer: Self-pay | Admitting: Cardiology

## 2023-07-13 ENCOUNTER — Ambulatory Visit: Payer: Commercial Managed Care - PPO | Attending: Cardiology

## 2023-07-13 DIAGNOSIS — R0602 Shortness of breath: Secondary | ICD-10-CM

## 2023-07-13 DIAGNOSIS — I10 Essential (primary) hypertension: Secondary | ICD-10-CM

## 2023-07-13 DIAGNOSIS — I2510 Atherosclerotic heart disease of native coronary artery without angina pectoris: Secondary | ICD-10-CM

## 2023-07-13 DIAGNOSIS — E782 Mixed hyperlipidemia: Secondary | ICD-10-CM

## 2023-07-13 DIAGNOSIS — R002 Palpitations: Secondary | ICD-10-CM

## 2023-07-13 DIAGNOSIS — R000 Tachycardia, unspecified: Secondary | ICD-10-CM

## 2023-07-13 DIAGNOSIS — E1110 Type 2 diabetes mellitus with ketoacidosis without coma: Secondary | ICD-10-CM

## 2023-07-13 LAB — ECHOCARDIOGRAM COMPLETE: S' Lateral: 2 cm

## 2023-07-14 ENCOUNTER — Other Ambulatory Visit (HOSPITAL_COMMUNITY): Payer: Self-pay

## 2023-07-14 NOTE — Progress Notes (Signed)
Heart squeezes 60-65%, no regional wall motion abnormalities, some mild stiffness noted likely from elevated blood pressure.  No valvular abnormalities were noted.  Overall reassuring study.

## 2023-07-20 DIAGNOSIS — D122 Benign neoplasm of ascending colon: Secondary | ICD-10-CM | POA: Diagnosis not present

## 2023-07-20 DIAGNOSIS — K640 First degree hemorrhoids: Secondary | ICD-10-CM | POA: Diagnosis not present

## 2023-07-20 DIAGNOSIS — R935 Abnormal findings on diagnostic imaging of other abdominal regions, including retroperitoneum: Secondary | ICD-10-CM | POA: Diagnosis not present

## 2023-07-27 NOTE — Progress Notes (Signed)
6 episodes of SVT.  No sustained arrhythmias.  No atrial fibrillation.  Average heart rate of 88 bpm.  Continue current dosing of beta-blocker until follow-up.

## 2023-08-03 ENCOUNTER — Other Ambulatory Visit (HOSPITAL_COMMUNITY): Payer: Self-pay

## 2023-08-03 ENCOUNTER — Ambulatory Visit: Payer: Commercial Managed Care - PPO | Attending: Cardiology | Admitting: Cardiology

## 2023-08-03 ENCOUNTER — Encounter: Payer: Self-pay | Admitting: Cardiology

## 2023-08-03 VITALS — BP 126/85 | HR 109 | Ht 70.0 in | Wt 154.6 lb

## 2023-08-03 DIAGNOSIS — R002 Palpitations: Secondary | ICD-10-CM | POA: Diagnosis not present

## 2023-08-03 DIAGNOSIS — I10 Essential (primary) hypertension: Secondary | ICD-10-CM | POA: Diagnosis not present

## 2023-08-03 DIAGNOSIS — R0602 Shortness of breath: Secondary | ICD-10-CM

## 2023-08-03 DIAGNOSIS — E1110 Type 2 diabetes mellitus with ketoacidosis without coma: Secondary | ICD-10-CM

## 2023-08-03 DIAGNOSIS — I2510 Atherosclerotic heart disease of native coronary artery without angina pectoris: Secondary | ICD-10-CM | POA: Diagnosis not present

## 2023-08-03 DIAGNOSIS — E782 Mixed hyperlipidemia: Secondary | ICD-10-CM | POA: Diagnosis not present

## 2023-08-03 MED ORDER — METOPROLOL TARTRATE 100 MG PO TABS
200.0000 mg | ORAL_TABLET | Freq: Two times a day (BID) | ORAL | 3 refills | Status: AC
  Filled 2023-08-03 – 2024-07-14 (×2): qty 360, 90d supply, fill #0

## 2023-08-03 NOTE — Patient Instructions (Signed)
Medication Instructions:  - No changes *If you need a refill on your cardiac medications before your next appointment, please call your pharmacy*  Lab Work: Your provider would like for you to have following labs drawn today BMET and Mag.   If you have labs (blood work) drawn today and your tests are completely normal, you will receive your results only by: MyChart Message (if you have MyChart) OR A paper copy in the mail If you have any lab test that is abnormal or we need to change your treatment, we will call you to review the results.  Testing/Procedures: - None ordered  Follow-Up: At [facility], you and your health needs are our priority.  As part of our continuing mission to provide you with exceptional heart care, we have created designated Provider Care Teams.  These Care Teams include your primary Cardiologist (physician) and Advanced Practice Providers (APPs -  Physician Assistants and Nurse Practitioners) who all work together to provide you with the care you need, when you need it.  Your next appointment:   6 week(s)  Provider:   Charlsie Quest, NP

## 2023-08-03 NOTE — Progress Notes (Unsigned)
Cardiology Office Note:  .   Date:  08/05/2023  ID:  Alexis Campos, DOB January 24, 1966, MRN [id number] PCP: [facility]  [facility] Providers Cardiologist:  None    History of Present Illness: Alexis Campos is a 57 y.o. female with a past medical history of hypertension, hyperlipidemia, family history of coronary disease, type 2 diabetes, obesity, anemia, who is here today for follow-up.  Previous left heart catheterization was completed in 01/2000 which showed normal coronaries at that time.  Stress testing several years later she had an episode of SVT during stress testing and she received adenosine for treatment.  Coronary calcium score of 59.8 in 2022.  She was recently hospitalized 06/03/2023 after presenting to the [facility] for several days of nausea vomiting inability to tolerate oral intake and was admitted under the hospitalist service.  CT of the abdomen at that time of initial diagnosis showed descending colon colitis.  She had intermittent vomiting and continues to remain nauseous.  She started having diarrhea on the morning of 06/07/2023 stool culture ova parasites were negative.  She felt Rocephin and Flagyl for approximately 5 days.  She was treated for UTI, electrolyte derangements were corrected.  She had elevated LFTs but MRCP was negative for any acute pathology.  Rosuvastatin and fenofibrate were held due to elevated LFTs and recommended holding for another week and repeat labs when she followed up with her PCP.  She was subsequently considered stable for discharge and was discharged on 06/14/2023.   She was last seen in clinic 06/30/2023 with continued complaints of palpitations, tachycardia, and fatigue.  This is also associated with shortness of breath but she denies any chest discomfort.  She had previously been scheduled for an echocardiogram and a ZIO monitor all during her appointment.  Echocardiogram revealed an LVEF of 60 to  65%, no RWMA, G1 DD, and no valvular abnormalities.  Event monitor revealed an average heart rate of 88 bpm, 6 nonsustained SVT episodes the longest lasting 19.7 seconds.  No sustained arrhythmias and no atrial fibrillation was noted.  Metoprolol had been increased and she was started on magnesium.  She returns to clinic today with continued complaints of palpitations that are less frequent, tachycardia, and fatigue.  She states that she has not noted a continued elevated heart rate but is also not been taking previously ordered magnesium supplements.  She is scheduled to return to work in the upcoming week.  She also notes a decreased amount of oral intake.  She does have a follow-up appointment with her PCP on this upcoming Thursday.  She denies any recurrent visits to the hospital or recent emergency department visits.  ROS: 10 point review of system has been reviewed and considered negative with exception what is been listed in the HPI  Studies Reviewed: Marland Kitchen       Event Monitor (Zio) 07/22/2023 HR 55 - 176, average 88 bpm. 6 nonsustained SVT, longest 19.7 seconds with an average rate of 139 bpm. Rhythm strip suggests ST/AT. Rare supraventricular and ventricular ectopy. No sustained arrhythmias. No atrial fibrillation Symptom triggers correspond to sinus rhythm, atrial tachycardia  2D echo 07/13/2023 1. Left ventricular ejection fraction, by estimation, is 60 to 65%. The  left ventricle has normal function. The left ventricle has no regional  wall motion abnormalities. Left ventricular diastolic parameters are  consistent with Grade I diastolic  dysfunction (impaired relaxation). The average left ventricular global  longitudinal strain is -16.4 %.  2. Right ventricular systolic function is normal. The right ventricular  size is normal. Tricuspid regurgitation signal is inadequate for assessing  PA pressure.   3. The mitral valve is normal in structure. No evidence of mitral valve   regurgitation. No evidence of mitral stenosis.   4. The aortic valve is normal in structure. Aortic valve regurgitation is  not visualized. No aortic stenosis is present.   5. The inferior vena cava is normal in size with greater than 50%  respiratory variability, suggesting right atrial pressure of 3 mmHg.   Coronary CTA 02/20/21 IMPRESSION: 1. Coronary calcium score of 59.8. This was 92nd percentile for age and sex matched control.   2. Normal coronary origin with right dominance.   3. LAD and LCx calcifications causing minimal stenosis (<25%).   4. CAD-RADS 1. Minimal non-obstructive CAD (0-24%). Consider non-atherosclerotic causes of chest pain. Consider preventive therapy and risk factor modification. Risk Assessment/Calculations:         Physical Exam:   VS:  BP 126/85   Pulse (!) 109   Ht 5\' 10"  (1.778 m)   Wt 154 lb 9.6 oz (70.1 kg)   LMP 06/14/2018 (Approximate)   SpO2 98%   BMI 22.18 kg/m    Wt Readings from Last 3 Encounters:  08/03/23 154 lb 9.6 oz (70.1 kg)  06/30/23 157 lb (71.2 kg)  06/21/23 156 lb (70.8 kg)    GEN: Well nourished, well developed in no acute distress NECK: No JVD; No carotid bruits CARDIAC: RRR, tachycardic, no murmurs, rubs, gallops RESPIRATORY:  Clear to auscultation without rales, wheezing or rhonchi  ABDOMEN: Soft, non-tender, non-distended EXTREMITIES:  No edema; No deformity   ASSESSMENT AND PLAN: .   Palpitations have slowly been improving since increasing her metoprolol to 200 mg twice daily.  She continues to have tachycardia with her event monitor also showing 6 nonsustained SVTs.  We did discuss changing her medication from metoprolol or the addition of other medications such as ivabradine or diltiazem,  she would like to defer any other medications at this time.  She has been sent for repeat lab work today of CBC, CMP, and a repeat mag.  Unfortunately she had not been taking the magnesium supplementations as previously  ordered.  Occasional shortness of breath with her tachycardia and palpitations.  Echocardiogram revealed LVEF of 60 to 65%, no RWMA, G1 DD, and no valvular abnormalities were noted.  Mixed hyperlipidemia with an LDL of 80 on 02/15/2023.  She is continue rosuvastatin 40 mg daily, fenofibrate 48 mg daily, and Vascepa 2 g twice daily.  Type 2 diabetes with hyperglycemia and elevated A1c.  She is continued on insulin therapy.  This continues to be managed by her PCP.  Nonobstructive coronary artery disease where she denies angina or anginal equivalents.  She remains to be tachycardic but is less symptomatic today with changes in her medication.  Primary hypertension with blood pressure today 126/85.  Blood pressure is better improved today.  She is continued on her metoprolol.  She has been encouraged to continue to monitor pressure 1 to 2 hours postmedication administration as well.     Dispo: Patient return to clinic to see MD/APP in 6 weeks or sooner if needed for reevaluation of tachycardia.  Signed, Thayer Embleton, NP

## 2023-08-04 LAB — BASIC METABOLIC PANEL
BUN/Creatinine Ratio: 22 (ref 9–23)
BUN: 27 mg/dL — ABNORMAL HIGH (ref 6–24)
CO2: 22 mmol/L (ref 20–29)
Calcium: 9.6 mg/dL (ref 8.7–10.2)
Chloride: 103 mmol/L (ref 96–106)
Creatinine, Ser: 1.24 mg/dL — ABNORMAL HIGH (ref 0.57–1.00)
Glucose: 144 mg/dL — ABNORMAL HIGH (ref 70–99)
Potassium: 4.7 mmol/L (ref 3.5–5.2)
Sodium: 142 mmol/L (ref 134–144)
eGFR: 51 mL/min/{1.73_m2} — ABNORMAL LOW (ref >59)

## 2023-08-04 LAB — MAGNESIUM: Magnesium: 1.5 mg/dL — ABNORMAL LOW (ref 1.6–2.3)

## 2023-08-05 DIAGNOSIS — J069 Acute upper respiratory infection, unspecified: Secondary | ICD-10-CM | POA: Diagnosis not present

## 2023-08-05 DIAGNOSIS — R051 Acute cough: Secondary | ICD-10-CM | POA: Diagnosis not present

## 2023-08-05 DIAGNOSIS — E1165 Type 2 diabetes mellitus with hyperglycemia: Secondary | ICD-10-CM | POA: Diagnosis not present

## 2023-08-05 NOTE — Progress Notes (Signed)
Magnesium level remains low. Recommend restarting previously prescribed magnesium. Kidney function is elevated from baseline, recommend increasing oral intake again. Repeat BMP and MG in 1 week.

## 2023-08-12 ENCOUNTER — Other Ambulatory Visit: Payer: Self-pay

## 2023-08-12 DIAGNOSIS — I2510 Atherosclerotic heart disease of native coronary artery without angina pectoris: Secondary | ICD-10-CM

## 2023-08-12 DIAGNOSIS — I10 Essential (primary) hypertension: Secondary | ICD-10-CM

## 2023-09-06 ENCOUNTER — Other Ambulatory Visit: Payer: Self-pay

## 2023-09-06 ENCOUNTER — Other Ambulatory Visit (HOSPITAL_COMMUNITY): Payer: Self-pay

## 2023-09-08 ENCOUNTER — Other Ambulatory Visit (HOSPITAL_COMMUNITY): Payer: Self-pay

## 2023-09-21 ENCOUNTER — Ambulatory Visit: Payer: Commercial Managed Care - PPO | Admitting: Cardiology

## 2023-10-20 ENCOUNTER — Other Ambulatory Visit (HOSPITAL_COMMUNITY): Payer: Self-pay

## 2023-10-21 ENCOUNTER — Other Ambulatory Visit (HOSPITAL_COMMUNITY): Payer: Self-pay

## 2023-10-21 MED ORDER — MOUNJARO 5 MG/0.5ML ~~LOC~~ SOAJ
5.0000 mg | SUBCUTANEOUS | 6 refills | Status: AC
Start: 2023-10-21 — End: ?
  Filled 2023-10-21: qty 2, 28d supply, fill #0
  Filled 2023-11-17: qty 2, 28d supply, fill #1
  Filled 2023-12-22 (×2): qty 2, 28d supply, fill #2
  Filled 2024-02-08: qty 2, 28d supply, fill #3
  Filled 2024-03-08: qty 2, 28d supply, fill #4
  Filled 2024-04-06: qty 2, 28d supply, fill #5

## 2023-10-27 ENCOUNTER — Other Ambulatory Visit (HOSPITAL_COMMUNITY): Payer: Self-pay

## 2023-10-27 DIAGNOSIS — E038 Other specified hypothyroidism: Secondary | ICD-10-CM | POA: Diagnosis not present

## 2023-10-27 DIAGNOSIS — E1165 Type 2 diabetes mellitus with hyperglycemia: Secondary | ICD-10-CM | POA: Diagnosis not present

## 2023-10-27 DIAGNOSIS — E538 Deficiency of other specified B group vitamins: Secondary | ICD-10-CM | POA: Diagnosis not present

## 2023-10-27 DIAGNOSIS — E559 Vitamin D deficiency, unspecified: Secondary | ICD-10-CM | POA: Diagnosis not present

## 2023-10-27 DIAGNOSIS — I10 Essential (primary) hypertension: Secondary | ICD-10-CM | POA: Diagnosis not present

## 2023-10-27 DIAGNOSIS — E7800 Pure hypercholesterolemia, unspecified: Secondary | ICD-10-CM | POA: Diagnosis not present

## 2023-10-27 MED ORDER — DEXCOM G7 SENSOR MISC
5 refills | Status: AC
  Filled 2023-10-27: qty 9, 90d supply, fill #0
  Filled 2023-10-28 (×2): qty 6, 60d supply, fill #0
  Filled 2023-12-22: qty 9, 90d supply, fill #1
  Filled 2024-04-06: qty 9, 90d supply, fill #2
  Filled 2024-07-14: qty 3, 30d supply, fill #3

## 2023-10-27 MED ORDER — CYANOCOBALAMIN 1000 MCG/ML IJ SOLN
1000.0000 ug | INTRAMUSCULAR | 5 refills | Status: AC
  Filled 2023-10-27: qty 3, 90d supply, fill #0
  Filled 2024-02-08: qty 3, 90d supply, fill #1
  Filled 2024-09-20: qty 3, 90d supply, fill #2

## 2023-10-27 MED ORDER — CYANOCOBALAMIN 1000 MCG/ML IJ SOLN
1000.0000 ug | INTRAMUSCULAR | 5 refills | Status: DC
  Filled 2023-10-27: qty 3, 21d supply, fill #0

## 2023-10-28 ENCOUNTER — Other Ambulatory Visit (HOSPITAL_COMMUNITY): Payer: Self-pay

## 2023-11-11 ENCOUNTER — Other Ambulatory Visit (HOSPITAL_COMMUNITY): Payer: Self-pay

## 2023-11-11 MED ORDER — AMOXICILLIN-POT CLAVULANATE 875-125 MG PO TABS
1.0000 | ORAL_TABLET | Freq: Two times a day (BID) | ORAL | 0 refills | Status: AC
  Filled 2023-11-11: qty 14, 7d supply, fill #0

## 2023-11-16 ENCOUNTER — Other Ambulatory Visit (HOSPITAL_COMMUNITY): Payer: Self-pay

## 2023-11-16 DIAGNOSIS — E7800 Pure hypercholesterolemia, unspecified: Secondary | ICD-10-CM | POA: Diagnosis not present

## 2023-11-16 DIAGNOSIS — E1165 Type 2 diabetes mellitus with hyperglycemia: Secondary | ICD-10-CM | POA: Diagnosis not present

## 2023-11-16 DIAGNOSIS — E538 Deficiency of other specified B group vitamins: Secondary | ICD-10-CM | POA: Diagnosis not present

## 2023-11-16 DIAGNOSIS — E559 Vitamin D deficiency, unspecified: Secondary | ICD-10-CM | POA: Diagnosis not present

## 2023-11-16 MED ORDER — CIPROFLOXACIN HCL 500 MG PO TABS
500.0000 mg | ORAL_TABLET | Freq: Two times a day (BID) | ORAL | 0 refills | Status: AC
  Filled 2023-11-16: qty 6, 3d supply, fill #0

## 2023-11-17 ENCOUNTER — Other Ambulatory Visit (HOSPITAL_COMMUNITY): Payer: Self-pay

## 2023-12-22 ENCOUNTER — Other Ambulatory Visit (HOSPITAL_COMMUNITY): Payer: Self-pay

## 2023-12-23 ENCOUNTER — Other Ambulatory Visit (HOSPITAL_COMMUNITY): Payer: Self-pay

## 2024-02-08 ENCOUNTER — Other Ambulatory Visit (HOSPITAL_COMMUNITY): Payer: Self-pay

## 2024-02-08 ENCOUNTER — Other Ambulatory Visit: Payer: Self-pay | Admitting: Cardiovascular Disease

## 2024-02-09 ENCOUNTER — Other Ambulatory Visit (HOSPITAL_COMMUNITY): Payer: Self-pay

## 2024-02-09 ENCOUNTER — Other Ambulatory Visit: Payer: Self-pay

## 2024-02-09 MED ORDER — VASCEPA 1 G PO CAPS
2.0000 g | ORAL_CAPSULE | Freq: Two times a day (BID) | ORAL | 0 refills | Status: AC
  Filled 2024-02-09: qty 360, 90d supply, fill #0

## 2024-02-10 ENCOUNTER — Other Ambulatory Visit (HOSPITAL_COMMUNITY): Payer: Self-pay

## 2024-02-22 ENCOUNTER — Other Ambulatory Visit (HOSPITAL_COMMUNITY): Payer: Self-pay

## 2024-03-08 ENCOUNTER — Other Ambulatory Visit: Payer: Self-pay

## 2024-03-08 ENCOUNTER — Other Ambulatory Visit (HOSPITAL_COMMUNITY): Payer: Self-pay

## 2024-03-28 ENCOUNTER — Ambulatory Visit
Admission: RE | Admit: 2024-03-28 | Discharge: 2024-03-28 | Disposition: A | Discharge status: Home / Self Care | Payer: Self-pay | Source: Ambulatory Visit | Attending: Emergency Medicine | Admitting: Emergency Medicine

## 2024-03-28 VITALS — BP 128/86 | HR 102 | Temp 98.4°F | Resp 20

## 2024-03-28 DIAGNOSIS — U071 COVID-19: Secondary | ICD-10-CM

## 2024-03-28 DIAGNOSIS — B349 Viral infection, unspecified: Secondary | ICD-10-CM | POA: Diagnosis not present

## 2024-03-28 LAB — POC SOFIA SARS ANTIGEN FIA: SARS Coronavirus 2 Ag: POSITIVE — AB

## 2024-03-28 LAB — POCT RAPID STREP A (OFFICE): Rapid Strep A Screen: NEGATIVE

## 2024-03-28 MED ORDER — GUAIFENESIN-CODEINE 100-10 MG/5ML PO SOLN: 5.0000 mL | Freq: Four times a day (QID) | ORAL | 0 refills | Status: AC | PRN

## 2024-03-28 MED ORDER — BENZONATATE 100 MG PO CAPS: 100.0000 mg | ORAL_CAPSULE | Freq: Three times a day (TID) | ORAL | 0 refills | Status: AC

## 2024-03-28 MED ORDER — PROMETHAZINE-DM 6.25-15 MG/5ML PO SYRP: 5.0000 mL | ORAL_SOLUTION | Freq: Four times a day (QID) | ORAL | 0 refills | Status: DC | PRN

## 2024-03-28 MED ORDER — PAXLOVID (150/100) 10 X 150 MG & 10 X 100MG PO TBPK: 2.0000 | ORAL_TABLET | Freq: Two times a day (BID) | ORAL | 0 refills | Status: AC

## 2024-03-28 NOTE — ED Provider Notes (Signed)
 [facility]    CSN: [id number] Arrival date & time: 03/28/24  1023      History   Chief Complaint Chief Complaint  Patient presents with   Sore Throat    Cough, fever, body aches and very weak - Entered by patient    HPI Alexis Campos is a 58 y.o. female.   Patient presents for evaluation of fever peaking at 102 with bodyaches, right-sided ear pain, sore throat, nasal congestion and nausea with vomiting beginning 2 days ago.  Works as a Engineer, civil (consulting) with multiple exposures.  Has attempted Tylenol  ibuprofen  and.  Decreased appetite.  Denies wheezing.  Past Medical History:  Diagnosis Date   Anemia    Anemia 05/22/2014   Blood transfusion    Diabetes mellitus    diagnosed 1996-insulin  and metformin   Endometriosis    Fibromyalgia    GERD (gastroesophageal reflux disease)    History of blood transfusion 12/09   [facility]   Hyperlipidemia    Hypertension    Neuromuscular disorder (HCC)    fibromyalgia   Seasonal allergies    recent bronchitis-chest xray/ct scan-granuloma noted present since 2008   Sinusitis    Splenomegaly 05/22/2014    Patient Active Problem List   Diagnosis Date Noted   Abnormal LFTs 06/07/2023   Uncontrolled type 2 diabetes mellitus with hypoglycemia, with long-term current use of insulin  (HCC) 06/06/2023   Mixed diabetic hyperlipidemia associated with type 2 diabetes mellitus (HCC) 06/06/2023   Coronary artery disease involving native coronary artery of native heart without angina pectoris 06/06/2023   Essential hypertension 06/06/2023   Prolonged QT interval 06/06/2023   Hypomagnesemia 06/06/2023   Intractable nausea and vomiting 06/03/2023   Diabetes mellitus type 2 with ketoacidosis (HCC) 12/27/2021   Pyelonephritis    Splenomegaly 05/22/2014   Anemia 05/22/2014   Obesity (BMI 30-39.9) 02/26/2014   Abdominal pain 10/23/2011    Past Surgical History:  Procedure Laterality Date   ABDOMINAL SURGERY     LOA for endomeriosis    APPENDECTOMY  05/1995   CESAREAN SECTION     CHOLECYSTECTOMY  01/1995   COLONOSCOPY     lysis of adhesions  1996/ 2002   TONSILLECTOMY  09/1994    OB History     Gravida  1   Para  1   Term  0   Preterm  1   AB  0   Living  1      SAB  0   IAB  0   Ectopic  0   Multiple  0   Live Births               Home Medications    Prior to Admission medications   Medication Sig Start Date End Date Taking? Authorizing Provider  benzonatate  (TESSALON ) 100 MG capsule Take 1 capsule (100 mg total) by mouth every 8 (eight) hours. 03/28/24  Yes Brendy Ficek R, NP  guaiFENesin -codeine  100-10 MG/5ML syrup Take 5 mLs by mouth every 6 (six) hours as needed for cough. 03/28/24  Yes Eleonore Shippee R, NP  nirmatrelvir/ritonavir, renal dosing, (PAXLOVID , 150/100,) 10 x 150 MG & 10 x 100MG  TBPK Take 2 tablets by mouth 2 (two) times daily for 5 days. Dosage for moderate renal impairment (eGFR >/= 30 to <60 mL/min): 150 mg nirmatrelvir (one 150 mg tablet) with 100 mg ritonavir (one 100 mg tablet), with both tablets taken together twice daily for 5 days. Not recommended if eGFR < 30 mL/min. PAXLOVID  is not  recommend in patients with severe hepatic impairment (Child-Pugh Class C). 03/28/24 04/02/24 Yes Virda Betters, Shelba SAUNDERS, NP  Continuous Blood Gluc Receiver (DEXCOM G6 RECEIVER) DEVI Use as directed. 01/21/21     Continuous Glucose Sensor (DEXCOM G7 SENSOR) MISC Apply one sensor every 10 days. 10/27/23     cyanocobalamin  (VITAMIN B12) 1000 MCG/ML injection Inject 1 mL (1,000 mcg total) every 30 (thirty) days. 10/27/23     diphenhydramine -acetaminophen  (TYLENOL  PM) 25-500 MG TABS tablet Take 2 tablets by mouth at bedtime as needed (sleep).    [provider]  fenofibrate  (TRICOR ) 48 MG tablet Take 1 tablet (48 mg total) by mouth daily. 06/17/23   Vernon Ranks, MD  insulin  glargine-yfgn (SEMGLEE , YFGN,) 100 UNIT/ML Pen Inject 70 Units into the skin daily. Patient not taking: Reported on  08/03/2023 03/12/23     insulin  lispro (HUMALOG  KWIKPEN) 200 UNIT/ML KwikPen Inject 5-15 Units into the skin 3 (three) times daily. 03/12/23     Insulin  Pen Needle 31G X 8 MM MISC Use 4-5 times daily as directed 03/12/23     Magnesium  Oxide -Mg Supplement (KP MAG-OXIDE MAGNESIUM ) 200 MG TABS Take 2 tablets (400 mg) by mouth for 2 days then take 1 tablet (200 mg) by mouth daily thereafter 06/30/23   Gerard Frederick, NP  metoprolol  tartrate (LOPRESSOR ) 100 MG tablet Take 2 tablets (200 mg total) by mouth 2 (two) times daily. 08/03/23   Gerard Frederick, NP  ondansetron  (ZOFRAN ) 4 MG tablet Take 1 tablet (4 mg total) by mouth 2 (two) times daily as needed. Patient not taking: Reported on 08/03/2023 03/23/23     rosuvastatin  (CRESTOR ) 40 MG tablet Take 1 tablet (40 mg total) by mouth daily. 06/17/23   Vernon Ranks, MD  Sodium Sulfate-Mag Sulfate-KCl (SUTAB ) [phone] MG TABS use as directed 07/07/23     tirzepatide  (MOUNJARO ) 5 MG/0.5ML Pen Inject 5 mg into the skin once a week. 10/21/23     valACYclovir  (VALTREX ) 1000 MG tablet Take 1 tablet (1,000 mg total) by mouth daily. 06/16/23     VASCEPA  1 g capsule Take 2 capsules (2 g total) by mouth 2 (two) times daily. 02/09/24   Gerard Frederick, NP  insulin  glargine (SEMGLEE ) 100 UNIT/ML injection Inject 65 units into the skin daily. 03/25/22 09/24/22      Family History Family History  Problem Relation Age of Onset   Anemia Mother    Coronary artery disease Mother    CAD Father    CAD Brother     Social History Social History   Tobacco Use   Smoking status: Never   Smokeless tobacco: Never  Vaping Use   Vaping status: Never Used  Substance Use Topics   Alcohol use: No   Drug use: No     Allergies   Duloxetine hcl, Lisinopril, and Metoclopramide  hcl   Review of Systems Review of Systems   Physical Exam Triage Vital Signs ED Triage Vitals  Encounter Vitals Group     BP 03/28/24 1031 128/86     Girls Systolic BP Percentile --      Girls  Diastolic BP Percentile --      Boys Systolic BP Percentile --      Boys Diastolic BP Percentile --      Pulse Rate 03/28/24 1031 (!) 102     Resp 03/28/24 1031 20     Temp 03/28/24 1031 98.4 F (36.9 C)     Temp Source 03/28/24 1031 Oral     SpO2 03/28/24 1031 97 %  Weight --      Height --      Head Circumference --      Peak Flow --      Pain Score 03/28/24 1035 6     Pain Loc --      Pain Education --      Exclude from Growth Chart --    No data found.  Updated Vital Signs BP 128/86 (BP Location: Left Arm)   Pulse (!) 102   Temp 98.4 F (36.9 C) (Oral)   Resp 20   LMP 06/14/2018 (Approximate)   SpO2 97%   Visual Acuity Right Eye Distance:   Left Eye Distance:   Bilateral Distance:    Right Eye Near:   Left Eye Near:    Bilateral Near:     Physical Exam Constitutional:      Appearance: Normal appearance.  HENT:     Head: Normocephalic.     Right Ear: Tympanic membrane, ear canal and external ear normal.     Left Ear: Tympanic membrane, ear canal and external ear normal.     Nose: Congestion present.     Mouth/Throat:     Pharynx: Posterior oropharyngeal erythema present. No oropharyngeal exudate.  Eyes:     Extraocular Movements: Extraocular movements intact.  Cardiovascular:     Rate and Rhythm: Normal rate and regular rhythm.     Pulses: Normal pulses.     Heart sounds: Normal heart sounds.  Pulmonary:     Effort: Pulmonary effort is normal.     Breath sounds: Normal breath sounds.  Musculoskeletal:     Cervical back: Normal range of motion and neck supple.  Neurological:     Mental Status: She is alert and oriented to person, place, and time. Mental status is at baseline.      UC Treatments / Results  Labs (all labs ordered are listed, but only abnormal results are displayed) Labs Reviewed  POC SOFIA SARS ANTIGEN FIA - Abnormal; Notable for the following components:      Result Value   SARS Coronavirus 2 Ag Positive (*)    All other  components within normal limits  POCT RAPID STREP A (OFFICE) - Normal    EKG   Radiology No results found.  Procedures Procedures (including critical care time)  Medications Ordered in UC Medications - No data to display  Initial Impression / Assessment and Plan / UC Course  I have reviewed the triage vital signs and the nursing notes.  Pertinent labs & imaging results that were available during my care of the patient were reviewed by me and considered in my medical decision making (see chart for details).  COVID-19, viral illness  Patient is in no signs of distress nor toxic appearing.  Vital signs are stable.  Low suspicion for pneumonia, pneumothorax or bronchitis and therefore will defer imaging.  Discussed quarantine per the CDC.  Prescribed Paxlovid  and discussed administration advised against use of rosuvastatin  during treatment.  Additionally prescribed Tessalon  and guaifenesin  codeine , PDMP reviewed, low risk.May use additional over-the-counter medications as needed for supportive care.  May follow-up with urgent care as needed if symptoms persist or worsen.   Final Clinical Impressions(s) / UC Diagnoses   Final diagnoses:  Viral illness  COVID-19     Discharge Instructions      Covid 19 is a virus and should steadily improve in time it can take up to 7 to 10 days before you truly start to see a turnaround however  things will get better    Per the CDC you will need to quarantine and to your 24 hours without fever, if no fever may continue activity wearing mask  Begin Paxlovid  twice daily for 5 days, this illness  You may use Tessalon  pill every 8 hours as you may use cough syrup every 6 hours but please be mindful this can make you feel drowsy  You can take Tylenol  and/or Ibuprofen  as needed for fever reduction and pain relief.   For cough: honey 1/2 to 1 teaspoon (you can dilute the honey in water or another fluid).  You can also use guaifenesin  and  dextromethorphan for cough. You can use a humidifier for chest congestion and cough.  If you don't have a humidifier, you can sit in the bathroom with the hot shower running.      For sore throat: try warm salt water gargles, cepacol lozenges, throat spray, warm tea or water with lemon/honey, popsicles or ice, or OTC cold relief medicine for throat discomfort.   For congestion: take a daily anti-histamine like Zyrtec, Claritin, and a oral decongestant, such as pseudoephedrine.  You can also use Flonase  1-2 sprays in each nostril daily.   It is important to stay hydrated: drink plenty of fluids (water, gatorade/powerade/pedialyte, juices, or teas) to keep your throat moisturized and help further relieve irritation/discomfort.    ED Prescriptions     Medication Sig Dispense Auth. Provider   nirmatrelvir/ritonavir, renal dosing, (PAXLOVID , 150/100,) 10 x 150 MG & 10 x 100MG  TBPK Take 2 tablets by mouth 2 (two) times daily for 5 days. Dosage for moderate renal impairment (eGFR >/= 30 to <60 mL/min): 150 mg nirmatrelvir (one 150 mg tablet) with 100 mg ritonavir (one 100 mg tablet), with both tablets taken together twice daily for 5 days. Not recommended if eGFR < 30 mL/min. PAXLOVID  is not recommend in patients with severe hepatic impairment (Child-Pugh Class C). 20 tablet Tekila Caillouet R, NP   benzonatate  (TESSALON ) 100 MG capsule Take 1 capsule (100 mg total) by mouth every 8 (eight) hours. 21 capsule Marcayla Budge R, NP   promethazine -dextromethorphan (PROMETHAZINE -DM) 6.25-15 MG/5ML syrup  (Status: Discontinued) Take 5 mLs by mouth 4 (four) times daily as needed. 118 mL Aissa Lisowski R, NP   guaiFENesin -codeine  100-10 MG/5ML syrup Take 5 mLs by mouth every 6 (six) hours as needed for cough. 120 mL Alanzo Lamb, Shelba SAUNDERS, NP      I have reviewed the PDMP during this encounter.   Teresa Shelba SAUNDERS, NP 03/28/24 1101

## 2024-03-28 NOTE — Discharge Instructions (Addendum)
 Covid 19 is a virus and should steadily improve in time it can take up to 7 to 10 days before you truly start to see a turnaround however things will get better    Per the CDC you will need to quarantine and to your 24 hours without fever, if no fever may continue activity wearing mask  Begin Paxlovid  twice daily for 5 days, this illness  You may use Tessalon  pill every 8 hours as you may use cough syrup every 6 hours but please be mindful this can make you feel drowsy  You can take Tylenol  and/or Ibuprofen  as needed for fever reduction and pain relief.   For cough: honey 1/2 to 1 teaspoon (you can dilute the honey in water or another fluid).  You can also use guaifenesin  and dextromethorphan for cough. You can use a humidifier for chest congestion and cough.  If you don't have a humidifier, you can sit in the bathroom with the hot shower running.      For sore throat: try warm salt water gargles, cepacol lozenges, throat spray, warm tea or water with lemon/honey, popsicles or ice, or OTC cold relief medicine for throat discomfort.   For congestion: take a daily anti-histamine like Zyrtec, Claritin, and a oral decongestant, such as pseudoephedrine.  You can also use Flonase  1-2 sprays in each nostril daily.   It is important to stay hydrated: drink plenty of fluids (water, gatorade/powerade/pedialyte, juices, or teas) to keep your throat moisturized and help further relieve irritation/discomfort.

## 2024-03-28 NOTE — ED Triage Notes (Addendum)
 Patient reports productive cough with yellow sputum, body aches, sore throat and very weak. Patient also reports fever 102.0 last night. Patient states that she has been alternating Tylenol  and Ibuprofen  for symptoms with mild relief.  Patient took Tylenol  1000 mg PO at 8 am.

## 2024-04-06 ENCOUNTER — Other Ambulatory Visit (HOSPITAL_COMMUNITY): Payer: Self-pay

## 2024-04-06 MED ORDER — HUMALOG KWIKPEN 200 UNIT/ML ~~LOC~~ SOPN
5.0000 [IU] | PEN_INJECTOR | Freq: Three times a day (TID) | SUBCUTANEOUS | 5 refills | Status: AC
  Filled 2024-04-06: qty 12, 28d supply, fill #0
  Filled 2024-09-20: qty 12, 28d supply, fill #1

## 2024-04-27 ENCOUNTER — Other Ambulatory Visit (HOSPITAL_COMMUNITY): Payer: Self-pay

## 2024-04-27 ENCOUNTER — Other Ambulatory Visit: Payer: Self-pay

## 2024-04-27 DIAGNOSIS — E7800 Pure hypercholesterolemia, unspecified: Secondary | ICD-10-CM | POA: Diagnosis not present

## 2024-04-27 DIAGNOSIS — E1165 Type 2 diabetes mellitus with hyperglycemia: Secondary | ICD-10-CM | POA: Diagnosis not present

## 2024-04-27 DIAGNOSIS — I10 Essential (primary) hypertension: Secondary | ICD-10-CM | POA: Diagnosis not present

## 2024-04-27 DIAGNOSIS — E538 Deficiency of other specified B group vitamins: Secondary | ICD-10-CM | POA: Diagnosis not present

## 2024-04-27 DIAGNOSIS — E038 Other specified hypothyroidism: Secondary | ICD-10-CM | POA: Diagnosis not present

## 2024-04-27 MED ORDER — VITAMIN D (ERGOCALCIFEROL) 1.25 MG (50000 UNIT) PO CAPS
50000.0000 [IU] | ORAL_CAPSULE | ORAL | 5 refills | Status: AC
  Filled 2024-04-27: qty 12, 84d supply, fill #0
  Filled 2024-09-20: qty 12, 84d supply, fill #1

## 2024-04-27 MED ORDER — FREESTYLE LIBRE 3 PLUS SENSOR MISC
5 refills | Status: AC
  Filled 2024-04-27 (×2): qty 2, 30d supply, fill #0
  Filled 2024-07-14: qty 6, 84d supply, fill #0

## 2024-04-27 MED ORDER — MOUNJARO 7.5 MG/0.5ML ~~LOC~~ SOAJ
7.5000 mg | SUBCUTANEOUS | 5 refills | Status: AC
  Filled 2024-04-27 – 2024-05-01 (×2): qty 2, 28d supply, fill #0
  Filled 2024-06-01: qty 2, 28d supply, fill #1
  Filled 2024-07-14: qty 2, 28d supply, fill #2
  Filled 2024-07-14: qty 6, 84d supply, fill #2

## 2024-05-01 ENCOUNTER — Other Ambulatory Visit (HOSPITAL_COMMUNITY): Payer: Self-pay

## 2024-05-23 DIAGNOSIS — E7800 Pure hypercholesterolemia, unspecified: Secondary | ICD-10-CM | POA: Diagnosis not present

## 2024-05-30 ENCOUNTER — Other Ambulatory Visit: Payer: Self-pay

## 2024-05-30 ENCOUNTER — Other Ambulatory Visit (HOSPITAL_COMMUNITY): Payer: Self-pay

## 2024-05-30 DIAGNOSIS — I10 Essential (primary) hypertension: Secondary | ICD-10-CM | POA: Diagnosis not present

## 2024-05-30 DIAGNOSIS — E038 Other specified hypothyroidism: Secondary | ICD-10-CM | POA: Diagnosis not present

## 2024-05-30 DIAGNOSIS — Z0000 Encounter for general adult medical examination without abnormal findings: Secondary | ICD-10-CM | POA: Diagnosis not present

## 2024-05-30 DIAGNOSIS — Z23 Encounter for immunization: Secondary | ICD-10-CM | POA: Diagnosis not present

## 2024-05-30 DIAGNOSIS — E1165 Type 2 diabetes mellitus with hyperglycemia: Secondary | ICD-10-CM | POA: Diagnosis not present

## 2024-05-30 MED ORDER — DULOXETINE HCL 30 MG PO CPEP
30.0000 mg | ORAL_CAPSULE | Freq: Every day | ORAL | 0 refills | Status: AC
  Filled 2024-05-30: qty 90, 90d supply, fill #0

## 2024-05-30 MED ORDER — ROSUVASTATIN CALCIUM 20 MG PO TABS
20.0000 mg | ORAL_TABLET | Freq: Every day | ORAL | 1 refills | Status: AC
  Filled 2024-05-30: qty 90, 90d supply, fill #0
  Filled 2024-09-20: qty 90, 90d supply, fill #1

## 2024-06-01 ENCOUNTER — Other Ambulatory Visit (HOSPITAL_COMMUNITY): Payer: Self-pay

## 2024-06-05 ENCOUNTER — Other Ambulatory Visit (HOSPITAL_COMMUNITY): Payer: Self-pay

## 2024-06-21 DIAGNOSIS — R350 Frequency of micturition: Secondary | ICD-10-CM | POA: Diagnosis not present

## 2024-06-21 DIAGNOSIS — J3489 Other specified disorders of nose and nasal sinuses: Secondary | ICD-10-CM | POA: Diagnosis not present

## 2024-06-21 DIAGNOSIS — G44209 Tension-type headache, unspecified, not intractable: Secondary | ICD-10-CM | POA: Diagnosis not present

## 2024-06-27 DIAGNOSIS — R1110 Vomiting, unspecified: Secondary | ICD-10-CM | POA: Diagnosis not present

## 2024-06-27 DIAGNOSIS — R10A1 Flank pain, right side: Secondary | ICD-10-CM | POA: Diagnosis not present

## 2024-06-27 DIAGNOSIS — R509 Fever, unspecified: Secondary | ICD-10-CM | POA: Diagnosis not present

## 2024-06-27 DIAGNOSIS — R251 Tremor, unspecified: Secondary | ICD-10-CM | POA: Diagnosis not present

## 2024-06-27 DIAGNOSIS — R5383 Other fatigue: Secondary | ICD-10-CM | POA: Diagnosis not present

## 2024-06-29 ENCOUNTER — Other Ambulatory Visit (HOSPITAL_COMMUNITY): Payer: Self-pay

## 2024-07-14 ENCOUNTER — Other Ambulatory Visit: Payer: Self-pay

## 2024-07-14 ENCOUNTER — Other Ambulatory Visit (HOSPITAL_COMMUNITY): Payer: Self-pay

## 2024-08-03 DIAGNOSIS — E782 Mixed hyperlipidemia: Secondary | ICD-10-CM | POA: Diagnosis not present

## 2024-08-03 DIAGNOSIS — E038 Other specified hypothyroidism: Secondary | ICD-10-CM | POA: Diagnosis not present

## 2024-08-03 DIAGNOSIS — E1165 Type 2 diabetes mellitus with hyperglycemia: Secondary | ICD-10-CM | POA: Diagnosis not present

## 2024-08-08 ENCOUNTER — Other Ambulatory Visit (HOSPITAL_COMMUNITY): Payer: Self-pay

## 2024-08-08 MED ORDER — BUSPIRONE HCL 5 MG PO TABS
5.0000 mg | ORAL_TABLET | Freq: Two times a day (BID) | ORAL | 1 refills | Status: AC
  Filled 2024-08-08 (×2): qty 60, 30d supply, fill #0
  Filled 2024-09-20: qty 60, 30d supply, fill #1

## 2024-08-08 MED ORDER — DULOXETINE HCL 60 MG PO CPEP
60.0000 mg | ORAL_CAPSULE | Freq: Every day | ORAL | 0 refills | Status: AC
  Filled 2024-08-08 (×2): qty 90, 90d supply, fill #0

## 2024-08-09 ENCOUNTER — Other Ambulatory Visit: Payer: Self-pay

## 2024-08-22 ENCOUNTER — Other Ambulatory Visit (HOSPITAL_COMMUNITY): Payer: Self-pay

## 2024-09-20 ENCOUNTER — Other Ambulatory Visit: Payer: Self-pay

## 2024-09-20 ENCOUNTER — Other Ambulatory Visit (HOSPITAL_COMMUNITY): Payer: Self-pay

## 2024-09-21 ENCOUNTER — Other Ambulatory Visit (HOSPITAL_COMMUNITY): Payer: Self-pay
